# Patient Record
Sex: Female | Born: 1945
Health system: Southern US, Community
[De-identification: ages and names within clinical notes are randomized; demographics above are authoritative.]

## PROBLEM LIST (undated history)

## (undated) DIAGNOSIS — R609 Edema, unspecified: Secondary | ICD-10-CM

## (undated) DIAGNOSIS — D72829 Elevated white blood cell count, unspecified: Secondary | ICD-10-CM

## (undated) DIAGNOSIS — R600 Localized edema: Secondary | ICD-10-CM

## (undated) DIAGNOSIS — F419 Anxiety disorder, unspecified: Secondary | ICD-10-CM

## (undated) DIAGNOSIS — G5600 Carpal tunnel syndrome, unspecified upper limb: Secondary | ICD-10-CM

## (undated) DIAGNOSIS — K921 Melena: Secondary | ICD-10-CM

## (undated) DIAGNOSIS — I129 Hypertensive chronic kidney disease with stage 1 through stage 4 chronic kidney disease, or unspecified chronic kidney disease: Secondary | ICD-10-CM

## (undated) DIAGNOSIS — I10 Essential (primary) hypertension: Secondary | ICD-10-CM

## (undated) DIAGNOSIS — I5032 Chronic diastolic (congestive) heart failure: Secondary | ICD-10-CM

## (undated) DIAGNOSIS — R569 Unspecified convulsions: Secondary | ICD-10-CM

## (undated) DIAGNOSIS — N3020 Other chronic cystitis without hematuria: Secondary | ICD-10-CM

## (undated) DIAGNOSIS — Z860100 Personal history of colon polyps, unspecified: Secondary | ICD-10-CM

## (undated) DIAGNOSIS — I319 Disease of pericardium, unspecified: Secondary | ICD-10-CM

## (undated) DIAGNOSIS — M8580 Other specified disorders of bone density and structure, unspecified site: Secondary | ICD-10-CM

## (undated) DIAGNOSIS — E46 Unspecified protein-calorie malnutrition: Secondary | ICD-10-CM

## (undated) DIAGNOSIS — M545 Low back pain, unspecified: Secondary | ICD-10-CM

## (undated) DIAGNOSIS — R202 Paresthesia of skin: Secondary | ICD-10-CM

## (undated) DIAGNOSIS — G4700 Insomnia, unspecified: Secondary | ICD-10-CM

## (undated) DIAGNOSIS — G44209 Tension-type headache, unspecified, not intractable: Secondary | ICD-10-CM

## (undated) DIAGNOSIS — N183 Chronic kidney disease, stage 3 unspecified: Secondary | ICD-10-CM

## (undated) HISTORY — DX: Elevated white blood cell count, unspecified: D72.829

## (undated) HISTORY — DX: Melena: K92.1

## (undated) HISTORY — DX: Edema, unspecified: R60.9

## (undated) HISTORY — DX: Localized edema: R60.0

## (undated) HISTORY — DX: Tension-type headache, unspecified, not intractable: G44.209

## (undated) HISTORY — DX: Unspecified protein-calorie malnutrition: E46

## (undated) HISTORY — DX: Insomnia, unspecified: G47.00

## (undated) HISTORY — DX: Carpal tunnel syndrome, unspecified upper limb: G56.00

## (undated) HISTORY — DX: Hypertensive chronic kidney disease with stage 1 through stage 4 chronic kidney disease, or unspecified chronic kidney disease: I12.9

## (undated) HISTORY — DX: Essential (primary) hypertension: I10

## (undated) HISTORY — DX: Low back pain, unspecified: M54.50

## (undated) HISTORY — DX: Other chronic cystitis without hematuria: N30.20

## (undated) HISTORY — DX: Other specified disorders of bone density and structure, unspecified site: M85.80

## (undated) HISTORY — DX: Chronic kidney disease, stage 3 unspecified: N18.30

## (undated) HISTORY — DX: Personal history of colon polyps, unspecified: Z86.0100

## (undated) HISTORY — PX: VESICOVAGINAL FISTULA CLOSURE W/ TAH: SUR271

---

## 1998-01-23 ENCOUNTER — Other Ambulatory Visit: Admission: RE | Admit: 1998-01-23 | Discharge: 1998-01-23 | Payer: Self-pay | Admitting: Orthopaedic Surgery

## 1998-02-07 ENCOUNTER — Ambulatory Visit (HOSPITAL_BASED_OUTPATIENT_CLINIC_OR_DEPARTMENT_OTHER): Admission: RE | Admit: 1998-02-07 | Discharge: 1998-02-07 | Payer: Self-pay | Admitting: Orthopaedic Surgery

## 1998-03-03 ENCOUNTER — Ambulatory Visit (HOSPITAL_COMMUNITY): Admission: RE | Admit: 1998-03-03 | Discharge: 1998-03-03 | Payer: Self-pay | Admitting: Cardiology

## 1998-04-19 ENCOUNTER — Other Ambulatory Visit: Admission: RE | Admit: 1998-04-19 | Discharge: 1998-04-19 | Payer: Self-pay | Admitting: *Deleted

## 1998-04-28 ENCOUNTER — Emergency Department (HOSPITAL_COMMUNITY): Admission: EM | Admit: 1998-04-28 | Discharge: 1998-04-28 | Payer: Self-pay | Admitting: Emergency Medicine

## 1998-06-09 ENCOUNTER — Ambulatory Visit (HOSPITAL_COMMUNITY): Admission: RE | Admit: 1998-06-09 | Discharge: 1998-06-09 | Payer: Self-pay | Admitting: Cardiology

## 1998-09-19 ENCOUNTER — Ambulatory Visit (HOSPITAL_BASED_OUTPATIENT_CLINIC_OR_DEPARTMENT_OTHER): Admission: RE | Admit: 1998-09-19 | Discharge: 1998-09-19 | Payer: Self-pay | Admitting: Orthopaedic Surgery

## 1998-09-19 ENCOUNTER — Encounter: Payer: Self-pay | Admitting: Orthopaedic Surgery

## 1999-05-02 ENCOUNTER — Other Ambulatory Visit: Admission: RE | Admit: 1999-05-02 | Discharge: 1999-05-02 | Payer: Self-pay | Admitting: *Deleted

## 2000-05-01 ENCOUNTER — Other Ambulatory Visit: Admission: RE | Admit: 2000-05-01 | Discharge: 2000-05-01 | Payer: Self-pay | Admitting: *Deleted

## 2000-07-24 ENCOUNTER — Inpatient Hospital Stay (HOSPITAL_COMMUNITY): Admission: EM | Admit: 2000-07-24 | Discharge: 2000-08-03 | Payer: Self-pay | Admitting: *Deleted

## 2002-01-10 ENCOUNTER — Emergency Department (HOSPITAL_COMMUNITY): Admission: EM | Admit: 2002-01-10 | Discharge: 2002-01-10 | Payer: Self-pay | Admitting: Emergency Medicine

## 2002-01-10 ENCOUNTER — Encounter: Payer: Self-pay | Admitting: Emergency Medicine

## 2002-03-16 ENCOUNTER — Inpatient Hospital Stay (HOSPITAL_COMMUNITY): Admission: EM | Admit: 2002-03-16 | Discharge: 2002-03-18 | Payer: Self-pay | Admitting: Psychiatry

## 2002-06-18 ENCOUNTER — Encounter: Payer: Self-pay | Admitting: Orthopaedic Surgery

## 2002-06-22 ENCOUNTER — Inpatient Hospital Stay (HOSPITAL_COMMUNITY): Admission: RE | Admit: 2002-06-22 | Discharge: 2002-06-27 | Payer: Self-pay | Admitting: Orthopaedic Surgery

## 2002-06-29 ENCOUNTER — Emergency Department (HOSPITAL_COMMUNITY): Admission: EM | Admit: 2002-06-29 | Discharge: 2002-06-29 | Payer: Self-pay | Admitting: Emergency Medicine

## 2004-10-02 ENCOUNTER — Encounter: Admission: RE | Admit: 2004-10-02 | Discharge: 2004-10-02 | Payer: Self-pay | Admitting: Orthopaedic Surgery

## 2004-10-13 ENCOUNTER — Encounter: Admission: RE | Admit: 2004-10-13 | Discharge: 2004-10-13 | Payer: Self-pay | Admitting: Orthopaedic Surgery

## 2004-10-26 ENCOUNTER — Encounter: Admission: RE | Admit: 2004-10-26 | Discharge: 2004-10-26 | Payer: Self-pay | Admitting: Orthopaedic Surgery

## 2004-11-13 ENCOUNTER — Encounter: Admission: RE | Admit: 2004-11-13 | Discharge: 2004-11-13 | Payer: Self-pay | Admitting: Orthopaedic Surgery

## 2004-11-27 ENCOUNTER — Encounter: Admission: RE | Admit: 2004-11-27 | Discharge: 2004-11-27 | Payer: Self-pay | Admitting: Orthopaedic Surgery

## 2005-01-17 ENCOUNTER — Observation Stay (HOSPITAL_COMMUNITY): Admission: EM | Admit: 2005-01-17 | Discharge: 2005-01-18 | Payer: Self-pay | Admitting: *Deleted

## 2005-06-10 ENCOUNTER — Other Ambulatory Visit: Admission: RE | Admit: 2005-06-10 | Discharge: 2005-06-10 | Payer: Self-pay | Admitting: Nephrology

## 2005-10-18 ENCOUNTER — Encounter: Admission: RE | Admit: 2005-10-18 | Discharge: 2005-10-18 | Payer: Self-pay | Admitting: Nephrology

## 2005-10-21 ENCOUNTER — Encounter: Admission: RE | Admit: 2005-10-21 | Discharge: 2005-10-21 | Payer: Self-pay | Admitting: Nephrology

## 2006-07-10 ENCOUNTER — Other Ambulatory Visit: Admission: RE | Admit: 2006-07-10 | Discharge: 2006-07-10 | Payer: Self-pay | Admitting: Nephrology

## 2007-03-06 ENCOUNTER — Emergency Department (HOSPITAL_COMMUNITY): Admission: EM | Admit: 2007-03-06 | Discharge: 2007-03-06 | Payer: Self-pay | Admitting: Emergency Medicine

## 2007-09-10 ENCOUNTER — Emergency Department (HOSPITAL_COMMUNITY): Admission: EM | Admit: 2007-09-10 | Discharge: 2007-09-10 | Payer: Self-pay | Admitting: Emergency Medicine

## 2007-09-21 ENCOUNTER — Encounter: Admission: RE | Admit: 2007-09-21 | Discharge: 2007-09-21 | Payer: Self-pay | Admitting: Orthopaedic Surgery

## 2007-10-13 ENCOUNTER — Inpatient Hospital Stay (HOSPITAL_COMMUNITY): Admission: RE | Admit: 2007-10-13 | Discharge: 2007-10-17 | Payer: Self-pay | Admitting: Orthopaedic Surgery

## 2007-10-15 ENCOUNTER — Encounter (INDEPENDENT_AMBULATORY_CARE_PROVIDER_SITE_OTHER): Payer: Self-pay | Admitting: Orthopaedic Surgery

## 2007-10-15 ENCOUNTER — Ambulatory Visit: Payer: Self-pay | Admitting: Vascular Surgery

## 2008-07-21 ENCOUNTER — Inpatient Hospital Stay (HOSPITAL_COMMUNITY): Admission: EM | Admit: 2008-07-21 | Discharge: 2008-07-25 | Payer: Self-pay | Admitting: Emergency Medicine

## 2008-08-09 ENCOUNTER — Encounter: Admission: RE | Admit: 2008-08-09 | Discharge: 2008-08-09 | Payer: Self-pay | Admitting: Nephrology

## 2008-09-28 ENCOUNTER — Ambulatory Visit: Payer: Self-pay | Admitting: Internal Medicine

## 2008-09-28 DIAGNOSIS — J45909 Unspecified asthma, uncomplicated: Secondary | ICD-10-CM | POA: Insufficient documentation

## 2008-09-28 DIAGNOSIS — I10 Essential (primary) hypertension: Secondary | ICD-10-CM | POA: Insufficient documentation

## 2008-09-28 DIAGNOSIS — J189 Pneumonia, unspecified organism: Secondary | ICD-10-CM

## 2008-09-28 DIAGNOSIS — R05 Cough: Secondary | ICD-10-CM

## 2008-10-12 ENCOUNTER — Ambulatory Visit: Payer: Self-pay | Admitting: Internal Medicine

## 2008-10-12 DIAGNOSIS — R0609 Other forms of dyspnea: Secondary | ICD-10-CM

## 2008-10-12 DIAGNOSIS — R0989 Other specified symptoms and signs involving the circulatory and respiratory systems: Secondary | ICD-10-CM

## 2008-10-12 LAB — CONVERTED CEMR LAB
Chloride: 97 meq/L (ref 96–112)
Creatinine, Ser: 1.4 mg/dL — ABNORMAL HIGH (ref 0.4–1.2)
GFR calc non Af Amer: 40 mL/min
HCT: 45.5 % (ref 36.0–46.0)
Hemoglobin: 15.7 g/dL — ABNORMAL HIGH (ref 12.0–15.0)
Lymphocytes Relative: 42.9 % (ref 12.0–46.0)
MCHC: 34.4 g/dL (ref 30.0–36.0)
MCV: 92.1 fL (ref 78.0–100.0)
Monocytes Absolute: 0.6 10*3/uL (ref 0.1–1.0)
Neutro Abs: 4.7 10*3/uL (ref 1.4–7.7)
Potassium: 3.2 meq/L — ABNORMAL LOW (ref 3.5–5.1)
Pro B Natriuretic peptide (BNP): 24 pg/mL (ref 0.0–100.0)
WBC: 10.1 10*3/uL (ref 4.5–10.5)

## 2008-10-27 ENCOUNTER — Ambulatory Visit: Payer: Self-pay | Admitting: Internal Medicine

## 2008-11-02 ENCOUNTER — Telehealth (INDEPENDENT_AMBULATORY_CARE_PROVIDER_SITE_OTHER): Payer: Self-pay | Admitting: *Deleted

## 2008-12-02 ENCOUNTER — Ambulatory Visit: Payer: Self-pay | Admitting: Internal Medicine

## 2008-12-05 ENCOUNTER — Telehealth (INDEPENDENT_AMBULATORY_CARE_PROVIDER_SITE_OTHER): Payer: Self-pay | Admitting: *Deleted

## 2009-02-18 ENCOUNTER — Emergency Department (HOSPITAL_COMMUNITY): Admission: EM | Admit: 2009-02-18 | Discharge: 2009-02-18 | Payer: Self-pay | Admitting: Emergency Medicine

## 2009-03-12 HISTORY — PX: COLONOSCOPY: SHX174

## 2009-03-17 ENCOUNTER — Ambulatory Visit: Payer: Self-pay | Admitting: Internal Medicine

## 2009-06-26 ENCOUNTER — Encounter: Payer: Self-pay | Admitting: Internal Medicine

## 2009-07-18 ENCOUNTER — Ambulatory Visit (HOSPITAL_COMMUNITY): Admission: RE | Admit: 2009-07-18 | Discharge: 2009-07-18 | Payer: Self-pay | Admitting: Neurology

## 2009-07-18 ENCOUNTER — Encounter: Payer: Self-pay | Admitting: Cardiology

## 2009-07-18 ENCOUNTER — Ambulatory Visit: Payer: Self-pay | Admitting: Internal Medicine

## 2009-07-18 ENCOUNTER — Encounter: Admission: RE | Admit: 2009-07-18 | Discharge: 2009-07-18 | Payer: Self-pay | Admitting: Neurology

## 2009-07-18 ENCOUNTER — Ambulatory Visit: Payer: Self-pay

## 2009-07-18 ENCOUNTER — Encounter (INDEPENDENT_AMBULATORY_CARE_PROVIDER_SITE_OTHER): Payer: Self-pay | Admitting: Neurology

## 2009-07-19 ENCOUNTER — Ambulatory Visit: Payer: Self-pay | Admitting: Internal Medicine

## 2009-08-12 HISTORY — PX: TOTAL KNEE ARTHROPLASTY: SHX125

## 2009-11-01 ENCOUNTER — Encounter: Admission: RE | Admit: 2009-11-01 | Discharge: 2009-11-01 | Payer: Self-pay | Admitting: Neurology

## 2010-05-14 ENCOUNTER — Encounter: Admission: RE | Admit: 2010-05-14 | Discharge: 2010-05-14 | Payer: Self-pay

## 2010-07-24 ENCOUNTER — Encounter: Payer: Self-pay | Admitting: Internal Medicine

## 2010-07-25 ENCOUNTER — Telehealth (INDEPENDENT_AMBULATORY_CARE_PROVIDER_SITE_OTHER): Payer: Self-pay | Admitting: *Deleted

## 2010-07-26 ENCOUNTER — Ambulatory Visit: Payer: Self-pay | Admitting: Internal Medicine

## 2010-07-27 ENCOUNTER — Encounter: Payer: Self-pay | Admitting: Internal Medicine

## 2010-08-08 ENCOUNTER — Encounter: Payer: Self-pay | Admitting: Internal Medicine

## 2010-08-12 ENCOUNTER — Encounter: Payer: Self-pay | Admitting: Internal Medicine

## 2010-08-21 ENCOUNTER — Telehealth: Payer: Self-pay | Admitting: Internal Medicine

## 2010-09-01 ENCOUNTER — Encounter: Payer: Self-pay | Admitting: Obstetrics and Gynecology

## 2010-09-11 NOTE — Procedures (Signed)
Summary: SUMMARY REPORT  SUMMARY REPORT   Imported By: Mirna Mires 07/19/2009 15:37:23  _____________________________________________________________________  External Attachment:    Type:   Image     Comment:   External Document

## 2010-09-11 NOTE — Miscellaneous (Signed)
Summary: Orders Update pft charges  Clinical Lists Changes  Orders: Added new Service order of Carbon Monoxide diffusing w/capacity (94720) - Signed Added new Service order of Lung Volumes (94240) - Signed Added new Service order of Spirometry (Pre & Post) (94060) - Signed 

## 2010-09-11 NOTE — Progress Notes (Signed)
Summary: rx questions  Phone Note Call from Patient Call back at Home Phone [phone]   Caller: Patient Call For: wert Reason for Call: Talk to Nurse Summary of Call: Omeprazole 20 mg rx was written for her at last visit(04/23).  Her insurance is now paying for the Aciphex and have sent it to her.  Should she take this instead?  Please advise pt. which she needs to take. Initial call taken by: Eugene Gavia,  December 05, 2008 10:41 AM  Follow-up for Phone Call        yes, take the aciphex (basically the same rx) Follow-up by: Nyoka Cowden MD,  December 05, 2008 4:31 PM  Additional Follow-up for Phone Call Additional follow up Details #1::        Called spoke with pt advised OK to substitute Aciphex for Omeprazole per MW. Additional Follow-up by: Cloyde Reams RN,  December 05, 2008 4:47 PM

## 2010-09-11 NOTE — Assessment & Plan Note (Signed)
Summary: Pulmonary /  fu sob  change to B/B   Copy to:  Self Primary Provider/Referring Provider:  Dr. Lurena Joiner  CC:  fu visit....SOB with exertion.........  History of Present Illness: 66 yobf never smoker with problem intermittent itching sneezing difficulty breathing intermittent since age 65's especially with grass  much worse  since 2008 comes and goes daily basis at baseline at rest day and night but also reproducibly with slow adls, better at rest.  September 28, 2008 new consult  persistent resting sob since hosp cone 12/9 -12/09 no better, then prednisone as outpt no better, no better with with allegra.  Cough is dry and shrill is present day = night and causes gagging but no vomiting, not better with inhalers.  Cough takes her breath away but also feels sob even when not coughing.  rec stop actonel, take aciphex, stop acebutolol take bystolic.  October 12, 2008 ov reporting cough completely gone, no longer needs cough meds, suddenly looses breath even sitting still, never while sleeping.  doe x across parking lot. using proaire twice a day and at night wakes up wheezing also better with proaire. added symbicort   October 27, 2008 ov no cough but no change with symbicort in terms of doe except no longer needing poaire. Pt denies any significant sore throat, nasal congestion or excess secretions, fever, chills, sweats, unintended wt loss, pleuritic or exertional cp, orthopnea pnd or leg swelling.  Pt also denies any obvious fluctuation in symptoms with weather or environmental change or other alleviating or aggravating factors.       Current Medications (verified): 1)  Aspirin Adult Low Strength 81 Mg Tbec (Aspirin) .Marland Kitchen.. 1 Once Daily 2)  Diazepam 2 Mg Tabs (Diazepam) .Marland Kitchen.. 1 Three Times A Day 3)  Klor-Con M20 20 Meq Cr-Tabs (Potassium Chloride Crys Cr) .Marland Kitchen.. 1 Once Daily 4)  Bystolic 10 Mg  Tabs (Nebivolol Hcl) .... One Tablet Daily 5)  Demadex 20 Mg Tabs (Torsemide) .Marland Kitchen.. 1 Once Daily 6)   Divigel 1 Mg/gm Gel (Estradiol) .... As Directed 7)  Lunesta 3 Mg Tabs (Eszopiclone) .Marland Kitchen.. 1 At Bedtime As Needed 8)  Proair Hfa 108 (90 Base) Mcg/act Aers (Albuterol Sulfate) .... 2 Puffs Every 4 Hours As Needed 9)  Aciphex 20 Mg  Tbec (Rabeprazole Sodium) .... Take One 30-60 Min Before First and Last Meals of The Day 10)  Symbicort 80-4.5 Mcg/act  Aero (Budesonide-Formoterol Fumarate) .... Two Puffs Twice Daily  Allergies (verified): 1)  ! Codeine  Past History:  Past Medical History:    HYPERTENSION (ICD-401.9)    Unexplained DOE        - CT October 27, 2008 upper lobe emphysema only  Family History:    Reviewed history from 09/28/2008 and no changes required:       no asthma or allergy  Social History:    Reviewed history from 09/28/2008 and no changes required:       Divorced       Children       Retired Airline pilot       Never smoker       No ETOH  Vital Signs:  Patient profile:   65 year old female Height:      61 inches Weight:      173 pounds BMI:     32.81 O2 Sat:      93 % Temp:     97.6 degrees F oral Pulse rate:   99 / minute BP sitting:  130 / 80  (left arm) Cuff size:   regular  O2 Sat at Rest %:  93 O2 Flow:  room air  Physical Exam  Additional Exam:  obese bf amb nad  wt 175 09/28/08 > 170 October 12, 2008 > 173 October 27, 2008  Afeb with normal vital signs HEENT: nl dentition, turbinates, and orophanx. Nl external ear canals without cough reflex Neck without JVD/Nodes/TM Lungs clear to A and P bilaterally without cough on insp or exp maneuvers,  no longer pseudowheeze  RRR no s3 or murmur or increase in P2 Abd soft and benign with nl excursion in the supine position. No bruits or organomegaly Ext warm without calf tenderness, cyanosis clubbing or edema Skin warm and dry without lesions     CT of Chest  Procedure date:  10/27/2008  Findings:      ok except emphysema changes in upper lobes  Pulmonary Function Test Height (in.): 61 Gender:  Female  Impression & Recommendations:  Problem # 1:  ASTHMA (ICD-493.90) I had an extended discussion with the patient today lasting 15 to 20 minutes of a 25 minute visit on the following issues:  I spent extra time with the patient today explaining optimal mdi  technique.  This improved from  0-0%  CT shows evidence of copd but she has never smoked   she must have sign chronic asthma undertreated due to inablity to inhale med effectivey. Try option : change to Brovana and Budesonide, the equivalent of Symbicort, per neb.   Complete Medication List: 1)  Aspirin Adult Low Strength 81 Mg Tbec (Aspirin) .Marland Kitchen.. 1 once daily 2)  Diazepam 2 Mg Tabs (Diazepam) .Marland Kitchen.. 1 three times a day 3)  Klor-con M20 20 Meq Cr-tabs (Potassium chloride crys cr) .Marland Kitchen.. 1 once daily 4)  Bystolic 10 Mg Tabs (Nebivolol hcl) .... One tablet daily 5)  Demadex 20 Mg Tabs (Torsemide) .Marland Kitchen.. 1 once daily 6)  Divigel 1 Mg/gm Gel (Estradiol) .... As directed 7)  Lunesta 3 Mg Tabs (Eszopiclone) .Marland Kitchen.. 1 at bedtime as needed 8)  Proair Hfa 108 (90 Base) Mcg/act Aers (Albuterol sulfate) .... 2 puffs every 4 hours as needed 9)  Pulmicort 0.5 Mg/71ml Susp (Budesonide) .... One twice daily with brovana 10)  Brovana 15 Mcg/60ml Nebu (Arformoterol tartrate) .... One twice daily 11)  Omeprazole 20 Mg Cpdr (Omeprazole) .... By mouth daily. take one half hour before eating.  Other Orders: Misc. Referral (Misc. Ref) Est. Patient Level IV ([address])  Patient Instructions: 1)  stop symbicort and replace it with  Brovana and Budesonide, the equivalent of Symbicort, per neb.  2)  See Patient Care Coordinator before leaving for ct chest 3)  Please schedule a follow-up appointment in 1 month. Prescriptions: OMEPRAZOLE 20 MG  CPDR (OMEPRAZOLE) By mouth daily. Take one half hour before eating.  #30 x 11   Entered and Authorized by:   Nyoka Cowden MD   Signed by:   Nyoka Cowden MD on 10/27/2008   Method used:   Electronically to        [facility] [address] [phone]* (retail)       [address]       [place], [place]  [address]       Ph: [phone]       Fax: [phone]   RxID:   [id number] BROVANA 15 MCG/2ML NEBU (ARFORMOTEROL TARTRATE) one twice daily  #60 x 11   Entered and Authorized by:   Casimiro Needle  Denice Paradise MD   Signed by:   Nyoka Cowden MD on 10/27/2008   Method used:   Electronically to        [facility] [address] [phone]* (retail)       [address]       [place], [place]  [address]       Ph: [phone]       Fax: [phone]   RxID:   [id number] PULMICORT 0.5 MG/2ML SUSP (BUDESONIDE) one twice daily with brovana  #60 x 11   Entered and Authorized by:   Nyoka Cowden MD   Signed by:   Nyoka Cowden MD on 10/27/2008   Method used:   Electronically to        [facility] [address] [phone]* (retail)       [address]       [place], [place]  [address]       Ph: [phone]       Fax: [phone]   RxID:   [id number] OMEPRAZOLE 20 MG  CPDR (OMEPRAZOLE) By mouth daily. Take one half hour before eating.  #30 x 11   Entered and Authorized by:   Nyoka Cowden MD   Signed by:   Nyoka Cowden MD on 10/27/2008   Method used:   Faxed to ...       [facility] (mail-order)       [address]       [place], [place]  [address]       Ph: [phone]       Fax: [phone]   RxID:   [id number] BROVANA 15 MCG/2ML NEBU (ARFORMOTEROL TARTRATE)   #60 x 11   Entered and Authorized by:   Nyoka Cowden MD   Signed by:   Nyoka Cowden MD on 10/27/2008   Method used:   Faxed to ...       [facility] (mail-order)       [address]       [place], [place]  [address]       Ph: [phone]       Fax: [phone]   RxID:   [id number] PULMICORT 0.5 MG/2ML SUSP (BUDESONIDE) one twice daily with brovana  #60 x 11   Entered and Authorized by:   Nyoka Cowden MD   Signed by:   Nyoka Cowden MD on 10/27/2008   Method used:   Faxed to ...        [facility] (mail-order)       [address]       [place], [place]  [address]       Ph: [phone]       Fax: [phone]   RxID:   [id number]

## 2010-09-11 NOTE — Progress Notes (Signed)
Summary: rx clarification-directions/ rx request  Phone Note Call from Patient   Caller: Patient Call For: wert Summary of Call: pt needs clarification on directions for taking budesonide. also needs rx called in for bystolic (says this is for 10mg ). pt has used up the samples of this. [facility] [address]. please call pt at home # asap as she leaves for work at 10:30. or cell afterwards [phone] Initial call taken by: Tivis Ringer,  November 02, 2008 9:37 AM  Follow-up for Phone Call        [facility].   RX for Bystolic sent to pharmacy.  Pt needs to use the Budesonide along with the Brovana in nebulizer twice daily. Michel Bickers CMA  November 02, 2008 11:21 AM  Additional Follow-up for Phone Call Additional follow up Details #1::        Spoke with pt and made aware that nebs should be combined and taken two times a day.  She states that she has been using this correctly, just wanted to make sure.  Also, advised pt that rx for bystolic was sent to pharm. Additional Follow-up by: Vernie Murders,  November 02, 2008 2:52 PM    New/Updated Medications: BYSTOLIC 10 MG  TABS (NEBIVOLOL HCL) One tablet daily   Prescriptions: BYSTOLIC 10 MG  TABS (NEBIVOLOL HCL) One tablet daily  #30 x 6   Entered by:   Michel Bickers CMA   Authorized by:   Nyoka Cowden MD   Signed by:   Michel Bickers CMA on 11/02/2008   Method used:   Electronically to        [facility] [address] [phone]* (retail)       [address]       [place], [place]  [address]       Ph: [phone]       Fax: [phone]   RxID:   [id number]

## 2010-09-13 NOTE — Medication Information (Signed)
Summary: Brovana/ [facility]  Brovana/ [facility]   Imported By: Sherian Rein 08/31/2010 07:50:09  _____________________________________________________________________  External Attachment:    Type:   Image     Comment:   External Document

## 2010-09-13 NOTE — Assessment & Plan Note (Signed)
Summary: Pulmonary/ ext ov with hfa 75% p coaching    Copy to:  Self Primary Provider/Referring Provider:  Dr. Lurena Joiner  CC:  Dyspnea- the same.  History of Present Illness: 49  yobf never smoker with problem of itching sneezing difficulty breathing intermittent since age 65's especially with grass  much worse  since 2008 comes and goes daily basis at baseline at rest day and night but also reproducible  with slow adls, better at rest.  September 28, 2008 new consult  persistent resting sob since hosp cone 12/9 -12/09 no better, then prednisone as outpt no better, no better with with allegra.  Cough  dry and shrill  present day = night and causes gagging but no vomiting, not better with inhalers.  Cough takes her breath away but also feels sob even when not coughing.  rec stop actonel, take aciphex, stop acebutolol take bystolic.  October 12, 2008 ov reporting cough completely gone, no longer needs cough meds, suddenly looses breath even sitting still, never while sleeping.  doe x across parking lot. using proaire twice a day and at night wakes up wheezing also better with proaire. added symbicort   October 27, 2008 ov no cough but no change with symbicort in terms of doe except no longer needing poaire. not able to use hfa so rec  stop symbicort and replace it with  Brovana and Budesonide, the equivalent of Symbicort, per neb and same day  ct chest > limited emphysematous changes, thickened esophagus.  July 19, 2009 ov states can walk 4 miles without stopping,  quite happy with rx. rec no change  July 26, 2010 ov cc doe only with heavy exertion, no cough or noct co's or need for rescued. Pt denies any significant sore throat, dysphagia, itching, sneezing,  nasal congestion or excess secretions,  fever, chills, sweats, unintended wt loss, pleuritic or exertional cp, hempoptysis, change in activity tolerance  orthopnea pnd or leg swelling   Current Medications (verified): 1)  Diazepam 5 Mg  Tabs (Diazepam) .... 1/2 To 1 Three Times A Day As Needed 2)  Klor-Con M20 20 Meq Cr-Tabs (Potassium Chloride Crys Cr) .Marland Kitchen.. 1 Once Daily 3)  Bystolic 10 Mg  Tabs (Nebivolol Hcl) .... One Tablet Daily 4)  Demadex 20 Mg Tabs (Torsemide) .Marland Kitchen.. 1 Once Daily 5)  Divigel 1 Mg/gm Gel (Estradiol) .... As Directed 6)  Lunesta 3 Mg Tabs (Eszopiclone) .Marland Kitchen.. 1 At Bedtime As Needed 7)  Proair Hfa 108 (90 Base) Mcg/act Aers (Albuterol Sulfate) .... 2 Puffs Every 4 Hours As Needed 8)  Pulmicort 0.5 Mg/39ml Susp (Budesonide) .... One Twice Daily With Brovana 9)  Brovana 15 Mcg/69ml Nebu (Arformoterol Tartrate) .... One Twice Daily 10)  Protonix 40 Mg Tbec (Pantoprazole Sodium) .Marland Kitchen.. 1 30 Min Before Breakfast 11)  Citalopram Hydrobromide 40 Mg Tabs (Citalopram Hydrobromide) .Marland Kitchen.. 1 Once Daily 12)  Cyclobenzaprine Hcl 10 Mg Tabs (Cyclobenzaprine Hcl) .Marland Kitchen.. 1 Two Times A Day As Needed  Allergies (verified): 1)  ! Codeine  Past History:  Past Medical History: HYPERTENSION (ICD-401.9) Unexplained DOE c/w mild chronic asthma plus ?  element of gerd/lpr     - CT October 27, 2008 upper lobe emphysema and es thickening     - PFT's March 17, 2009 FEV1 1.55 (71%) ratio 65 no resp to B2, DLC0 44 corrects to 81%  Asthma     - hfa  July 26, 2010  75% p coaching  Past Surgical History: Hysterectomy 1978 R  Knee TKR jan2011  Vital Signs:  Patient profile:   65 year old female Weight:      174.25 pounds BMI:     30.02 O2 Sat:      93 % on Room air Temp:     98.0 degrees F oral Pulse rate:   105 / minute BP sitting:   130 / 64  (left arm)  Vitals Entered By: Vernie Murders (July 26, 2010 10:55 AM)  O2 Flow:  Room air  Physical Exam  Additional Exam:  obese bf amb nad   wt  173 December 02, 2008 > 172 March 17, 2009 > 164 July 19, 2009  > 174 July 26, 2010   HEENT: nl dentition, turbinates, and orophanx. Nl external ear canals without cough reflex Neck without JVD/Nodes/TM Lungs clear to A and P  bilaterally without cough on insp or exp maneuvers,  no longer pseudowheeze  RRR no s3 or murmur or increase in P2 Abd soft and benign with nl excursion in the supine position. No bruits or organomegaly Ext warm without calf tenderness, cyanosis clubbing or edema Skin warm and dry without lesions     Impression & Recommendations:  Problem # 1:  ASTHMA (ICD-493.90)  All goals of asthma met including optimal function and elimination of symptoms with minimum need for rescue therapy. Contingencies discussed today including the rule of two's.     Needs to be able to use hfa for rescuse so I spent extra time with the patient today explaining optimal mdi  technique.  This improved from  25-75% p extensive coaching.  Medications Added to Medication List This Visit: 1)  Diazepam 5 Mg Tabs (Diazepam) .... 1/2 to 1 three times a day as needed 2)  Protonix 40 Mg Tbec (Pantoprazole sodium) .Marland Kitchen.. 1 30 min before breakfast 3)  Citalopram Hydrobromide 40 Mg Tabs (Citalopram hydrobromide) .Marland Kitchen.. 1 once daily 4)  Cyclobenzaprine Hcl 10 Mg Tabs (Cyclobenzaprine hcl) .Marland Kitchen.. 1 two times a day as needed  Other Orders: Est. Patient Level III ([address]40) HFA Instruction [phone])  Patient Instructions: 1)  Work on inhaler technique:  relax and blow all the way out then take a nice smooth deep breath back in, triggering the inhaler at same time you start breathing in and hold a few seconds 2)  If your breathing worsens or you need to use your rescue inhaler more than twice weekly or wake up more than twice a month with any respiratory symptoms or require more than two rescue inhalers per year, we need to see you right away. 3)

## 2010-09-13 NOTE — Medication Information (Signed)
Summary: Prior [employer] for Brovana/[facility]  Prior Autho for [facility]   Imported By: Sherian Rein 08/10/2010 11:21:04  _____________________________________________________________________  External Attachment:    Type:   Image     Comment:   External Document

## 2010-09-13 NOTE — Progress Notes (Signed)
Summary: brovana approval 08-12-10 to 08-12-11  Phone Note Other Incoming   Summary of Call: Received a prior auth confirmation for Brovana from [facility].  Not sure which pharmacy pt has been getting this from.  LMOM for pt to call back to get pharmacy info so we can confirm prior auth approval. Initial call taken by: Abigail Miyamoto RN,  August 21, 2010 11:48 AM  Follow-up for Phone Call        Patient phoned stated that she was returning a call to she thought triage. she stated that she recevied a letter stating that her Rennie Plowman has been approved and she wants to know if we need a copy of the letter. She can be reached at [phone] or [phone] you can leave a message on her voice mail. She uses [employer] [facility].Vedia Coffer  August 22, 2010 1:22 PM  Additional Follow-up for Phone Call Additional follow up Details #1::        called [facility], they do have the approval but no dates.  ATC pt at 1st number, LMOM TCB.  called 2nd number and spoke with patient.  she will fax a letter to our main fax number so that we may have a copy for her chart w/ approval dates.  will hold message to PA box. Boone Master CNA/MA  August 22, 2010 5:26 PM     Additional Follow-up for Phone Call Additional follow up Details #2::    letter received and approval dates are 08-12-10 to 08-12-11. [place] was notified. Form placed in scan folder. Carron Curie CMA  August 29, 2010 9:11 AM

## 2010-09-13 NOTE — Medication Information (Signed)
Summary: Rosalyn Gess Denied/[facility]  Rosalyn Gess Denied/[facility]   Imported By: Sherian Rein 08/21/2010 10:47:11  _____________________________________________________________________  External Attachment:    Type:   Image     Comment:   External Document

## 2010-09-13 NOTE — Medication Information (Signed)
Summary: Rosalyn Gess Denied/[facility]  RX Folder   Imported By: Valinda Hoar 08/08/2010 15:20:58  _____________________________________________________________________  External Attachment:    Type:   Image     Comment:   External Document

## 2010-09-13 NOTE — Progress Notes (Signed)
Summary: needs ov to have form filled out  Phone Note Outgoing Call   Call placed by: Vernie Murders,  July 25, 2010 4:54 PM Call placed to: Patient Summary of Call: Pt dropped off a form regarding her meds.  Per MW- since she has not been seen in over 1 yr, needs ov.  I called the pt, but had to leave a msg tcb Initial call taken by: Vernie Murders,  July 25, 2010 4:55 PM  Follow-up for Phone Call        pt returned call. she made a f/u appt for today w/ dr wert. Tivis Ringer, CNA  July 26, 2010 9:16 AM

## 2010-09-13 NOTE — Medication Information (Signed)
Summary: Approved for Rosalyn Gess / [employer]  Approved for Rosalyn Gess / [employer]   Imported By: Lennie Odor 09/06/2010 16:07:40  _____________________________________________________________________  External Attachment:    Type:   Image     Comment:   External Document

## 2010-11-18 LAB — COMPREHENSIVE METABOLIC PANEL
BUN: 8 mg/dL (ref 6–23)
Calcium: 8.9 mg/dL (ref 8.4–10.5)
Glucose, Bld: 116 mg/dL — ABNORMAL HIGH (ref 70–99)
Total Protein: 6.4 g/dL (ref 6.0–8.3)

## 2010-11-18 LAB — URINALYSIS, ROUTINE W REFLEX MICROSCOPIC
Glucose, UA: NEGATIVE mg/dL
Nitrite: NEGATIVE
Protein, ur: NEGATIVE mg/dL
pH: 7 (ref 5.0–8.0)

## 2010-11-18 LAB — DIFFERENTIAL
Basophils Relative: 1 % (ref 0–1)
Lymphs Abs: 2.9 10*3/uL (ref 0.7–4.0)
Monocytes Relative: 4 % (ref 3–12)
Neutro Abs: 3.7 10*3/uL (ref 1.7–7.7)
Neutrophils Relative %: 51 % (ref 43–77)

## 2010-11-18 LAB — HEMOCCULT GUIAC POC 1CARD (OFFICE): Fecal Occult Bld: NEGATIVE

## 2010-11-18 LAB — CBC
HCT: 40.8 % (ref 36.0–46.0)
Hemoglobin: 13.9 g/dL (ref 12.0–15.0)
MCHC: 34 g/dL (ref 30.0–36.0)
MCV: 91.8 fL (ref 78.0–100.0)
RDW: 13.8 % (ref 11.5–15.5)

## 2010-12-25 NOTE — Op Note (Signed)
NAMEMIKELL, FERRANDINO               ACCOUNT NO.:  [id number]   MEDICAL RECORD NO.:  [id number]          PATIENT TYPE:  INP   LOCATION:  5006                         FACILITY:  [facility]   PHYSICIAN:  Claude Manges. Whitfield, M.D.DATE OF BIRTH:  1946-06-19   DATE OF PROCEDURE:  10/13/2007  DATE OF DISCHARGE:                               OPERATIVE REPORT   PREOPERATIVE DIAGNOSIS:  Loosened right total knee replacement.   POSTOPERATIVE DIAGNOSIS:  Loosened right total knee replacement.   PROCEDURE:  Revision arthroplasty, right total knee replacement.   SURGEON:  Claude Manges. Cleophas Dunker, M.D.   ASSISTANTS:  Lenard Galloway. Chaney Malling, M.D., and Oris Drone. Petrarca, P.A.-C.   ANESTHESIA:  General with supplement femoral nerve block.   COMPLICATIONS:  None.   COMPONENTS:  DePuy MBT size 3 tibial tray with a 75 x 14-mm universal  fluted stem, a 10-mm polyethylene bridging, Sigma RP constrained TC3  insert conforming to the #3 tibia.   Femoral component was a #3 tibial tray with 4-mm augments anteriorly and  posteriorly and a 75 x 14-mm universal fluted stem.  Both was cemented  with polymethyl methacrylate.  The polyethylene tibial button appeared  to be intact and without loosening and therefore was left in place.   PROCEDURE:  The patient was met in the preoperative holding area.  The  appropriate extremity was identified and marked and any questions were  answered.  The patient was then taken to the operating room, where she  was placed under general orotracheal anesthesia without complication.  She did receive a preoperative femoral nerve block.   A Foley catheter was inserted by the nursing staff.  A tourniquet was  applied to the right lower extremity and the leg was then prepped with  Betadine scrub and then DuraPrep from the tourniquet to the midfoot.  Sterile draping was performed.   With the extremity still elevated, it was Esmarch-exsanguinated with the  proximal tourniquet at 350  mmHg.   The prior anterior incision was elliptically excised and via sharp  dissection carried down through the subcutaneous tissue.  The first  layer of capsule was incised in the midline.  A medial parapatellar  incision was then made through the deep capsule.  We had some difficulty  everting the patella laterally as there was significant thickened tissue  in the gutters and about the patella.  There was just a very small area  of avulsion of the patellar tendon that was subsequently repaired with a  Mitek anchor.  After everting the patella, the joint was explored.  There was no effusion.  There was no significant synovitis.  After  removing a cicatrix around the tibia, it was obviously loose and  therefore it was removed.  We initially amputated the stem of the  polyethylene bearing and then removed that.  We were able to place  retractors behind the tibia and excised what appeared to be remnants of  the PCL and thickened posterior capsule.  This allowed advancement of  the tibia anteriorly so that we could then carefully remove the tibial  tray by using small thin  osteotomes medial and laterally.  We removed it  in one piece with a large portion of the methacrylate attached.  Further  methacrylate was then removed from the stem with a small osteotome.  There was no violation of the tibial cortex.   We then evaluated the femur.  The bone scan was positive for loosening.  There was no evidence of infection.  We did subsequently send specimens  of tissue beneath the tibial and femoral components with probably  monocytes, no organisms and very few wbc's.  There was no gross evidence  or laboratory evidence of infection.   The femoral component was then removed.  There was a membrane beneath it  surrounding the anterior and posterior femur.  Any remaining  methacrylate was removed from both the femur and the tibia.   We proceeded with revision arthroplasty.  We initially proceeded  with  preparing the tibia.  Reaming was performed to 14 mm and then we used  the external guide to be sure we were in appropriate alignment.  The  cutting jig was applied to the proximal tibia.  We just made a minimal  cut but removing maybe a millimeter of bone but allowing [place] to remove  bone circumferentially.  Further reaming was performed to allow the  stemmed tibial component.  The stemmed tibial component was 75 mm long  with a 14-mm wide flute, was then inserted and flush on the tibia.  I  thought we had excellent alignment and position of the varus-valgus.   The femoral component was then prepared.  Reaming was performed to 14 mm  through the central femoral hole.  Guides were applied to make cuts both  anteriorly and posteriorly and distally.  We needed 4 mm distal and  posterior augments.  The component was only about a millimeter off the  femur anteriorly.   We decided a #3 and then inserted this with the 14-mm fluted stem, 75 mm  in length.  The construct was then reduced.  We initially tried the  nonconstrained 10-mm polyethylene bearing but felt that we had more  stability, particularly laterally, with a constrained component.  With  that in place we had perfect stability and full extension and flexion  and no malrotation.   The trial components were then removed.  The joint was copiously  irrigated with jet saline solution.  The thickened capsule posteriorly  was further excised.   The final tibial tray was then applied, #3 with the fluted stem.  Methacrylate was inserted and then removed from the periphery of the  component.  The femoral component was then inserted with the augments  and the fluted stem.  Methacrylate was removed from its from its  periphery as well.  The constrained 10-mm polyethylene bearing was then  easily inserted.  The entire construct was then placed in extension with  compression across the components and any further methacrylate was  removed  with a Glorious Peach.  We checked be sure there was no extraneous  methacrylate around any of the components.   After complete maturation, the joint was inspected.  It was copiously  irrigated with saline solution.  We had perfect stability and nice  alignment of the components.  The lab did report that the specimen sent  had monocytes, very few white cells, no organisms.  Clinically there was  no evidence of infection.   The tourniquet was deflated about 2 hours 15 minutes.  Gross bleeders  were Bovie-coagulated.  The capsule  was infiltrated with Marcaine with  epinephrine.  We had a nice, dry field and therefore a Hemovac was not  necessary.  A single Mitek anchor was inserted to secure the patellar  tendon.  Deep capsule was closed with interrupted #1  Ethibond, superficial capsule with a running 0Vicryl, subcu with 2-0  Vicryl, skin closed with skin clips.  A sterile bulky dressing was  applied, followed by a compressive dressing.   The patient tolerated the procedure without complications.      Claude Manges. Cleophas Dunker, M.D.  Electronically Signed     PWW/MEDQ  D:  10/13/2007  T:  10/13/2007  Job:  [id number]

## 2010-12-28 NOTE — Op Note (Signed)
NAME:  Renee Adkins, Renee Adkins                         ACCOUNT NO.:  [id number]   MEDICAL RECORD NO.:  [id number]                   PATIENT TYPE:  INP   LOCATION:  NA                                   FACILITY:  [facility]   PHYSICIAN:  Lubertha Basque. Jerl Santos, M.D.             DATE OF BIRTH:  1946-02-24   DATE OF PROCEDURE:  06/22/2002  DATE OF DISCHARGE:                                 OPERATIVE REPORT   PREOPERATIVE DIAGNOSIS:  Right knee degenerative arthritis.   POSTOPERATIVE DIAGNOSIS:  Right knee degenerative arthritis.   PROCEDURE:  Right total knee replacement.   ANESTHESIA:  General.   SURGEON:  Lubertha Basque. Jerl Santos, M.D.   ASSISTANT:  Lindwood Qua, P.A.   INDICATION FOR PROCEDURE:  The patient is a 66 year old woman with a long  history of right knee pain.  She is known to have end-stage degenerative  change through arthroscopy x2, the latest of which was about three years  ago.  Her difficulty has persisted despite various oral anti-inflammatories  and injections.  She is offered a knee replacement operation.  The procedure  was discussed with the patient, and informed operative consent was obtained  after discussion of possible complications of, reaction to anesthesia,  infection, DVT, PE, and death.   DESCRIPTION OF PROCEDURE:  The patient was taken to the operating suite,  where a general anesthetic was applied without difficulty.  She was  positioned supine and prepped and draped in a normal sterile fashion.  After  the administration of preop IV antibiotics, the right leg was elevated,  exsanguinated, and a tourniquet inflated about the thigh.  An anterior  longitudinal incision was made, which was sectioned down to the extensor  mechanism.  All appropriate anti-infective measures were used, including the  aforementioned IV antibiotic, Betadine-impregnated drape, and closed, hooded  exhaust systems for each member of the surgical team.  A medial parapatellar  incision was  made through the extensor mechanism.  The kneecap was flipped  and the knee flexed.  She had end-stage degeneration in all three  compartments.  Her bone quality was good.  There were some residual meniscal  tissues and an intact ACL and PCL, all of which were removed.  An  extramedullary guide was placed on the tibia, creating a cut parallel to the  floor.  An intramedullary guide was then placed in the femur to create  anterior and posterior cuts, making a flexion gap of 10 mm.  A separate  intramedullary guide was placed in the femur, creating a distal cut to form  an equal extension gap of 10 mm.  The femur sized to a standard plug  component, and the appropriate chamfer cutting guide was placed and  utilized.  The tibia sized to a size 3, and the appropriate guide was placed  and used.  The trial reduction was done with these components with the 10 mm  spacer.  She easily came to full extension and flexed past 100 degrees.  The  kneecap tracked well, and no lateral release was required.  The kneecap was  cut free-hand, removing about 9 mm of bone.  A size 35 appeared to fit best,  and the appropriate guide was placed and used to make three holes in the  patella.  All trial components were removed, followed by pulsatile lavage of  all cut bony surfaces.  Cement was mixed including antibiotic and then was  then pressurized into all three cut bony surfaces.  The DePuy LCS components  were then cemented into place.  We used a size standard plus femur, 3 tibia,  and 35 mm all-polyethylene patellar component.  The 10 mm deep dish spacer  was placed.  Excess cement was trimmed, and pressure was held on the  components until all cement had hardened.  The knee then ranged from full  extension to 120 degrees of flexion.  The tourniquet was deflated, and a  small amount of bleeding was easily controlled with Bovie cautery.  The knee  was thoroughly irrigated, followed by placement of a drain  exiting  superolaterally.  The extensor mechanism was reapproximated with #1 Vicryl  in interrupted fashion.  Once this was finished, the knee again flexed to  120.  Subcutaneous tissues reapproximated with 0 and 2-0 undyed Vicryl,  followed by skin closure with staples.  Adaptic was placed on the wound,  followed by dry gauze and a loose Ace wrap.  Estimated blood loss and  intraoperative fluids can be obtained from anesthesia records, as can  accurate tourniquet time, which was less than one hour.   DISPOSITION:  The patient was extubated in the operating room.  Plans were  for her to receive a femoral nerve block.  She was then to be transferred to  the recovery room.  She will eventually be admitted to the orthopedic  surgery service for appropriate postop care to include perioperative  antibiotics and Coumadin for DVT prophylaxis.                                               Lubertha Basque Jerl Santos, M.D.    PGD/MEDQ  D:  06/22/2002  T:  06/22/2002  Job:  [id number]

## 2010-12-28 NOTE — Discharge Summary (Signed)
[facility]  Patient:    Renee Adkins, Renee Adkins                      MRN: [id number] Adm. Date:  [id number] Disc. Date: [id number] Attending:  Otilio Saber                           Discharge Summary  BRIEF HISTORY:  Ms. Spillane is a 65 year old divorced black female admitted with history of increasing depression and suicidal ideation.  She had a long history of depression and anxiety, dating back to at least 1999.  She had been involved in severe conflicts with her work at [employer], saying that she had been forced out of a job by [employer] of the college.  She had a knee injury at the time also which required subsequent surgery.  She had become increasingly depressed with ongoing conflicts with the university and ongoing peers.  She had become increasingly dystonic with decreased sleep, decreased appetite, decreased interest, increased anxiety and increasing suicidal thoughts.  She could not contract for safety and it was felt that inpatient stabilization was necessary.  PAST PSYCHIATRIC HISTORY:  The patient had been followed through my office for the past two years.  She had been tried on several antidepressants but had not responded well.  She had shown some improvement on a combination of Topamax and Seroquel.  She had no previous psychiatric treatment or history.  ADMISSION MEDICATIONS:  Topamax 150 mg q.h.s., Xanax 0.5 mg five times a day, Seroquel 25 mg q.d. and 400 mg q.h.s. and p.r.n. Restoril.  PAST MEDICAL HISTORY:  She was followed medically by Dr. Ranae Pila.  She had a history of asthma and surgery on her right knee several years ago.  She has been on Premarin, Provera, Sectral and Claritin.  ALLERGIES:  She reported being allergic to CODEINE.  PHYSICAL EXAMINATION:  On admission, showed no acute findings.  MENTAL STATUS EXAMINATION:  On admission, casually-dressed, mildly overweight, middle-aged black female.  Speech was soft but  otherwise normal.  Thought processes are logical and coherent without evidence of psychosis.  She had suicidal ideation.  Mood was depressed.  Affect was sad and blunted.  Oriented x 3.  Cognitive function was intact.  ADMITTING DIAGNOSES: Axis I:    Depression not otherwise specified. Axis II:   No diagnosis. Axis III:  Status post surgery on right knee. Axis IV:   Psychosocial stressors severe. Axis V:    Global Assessment of Functioning:  Current 40; highest past year            59.  LABORATORY FINDINGS:  Admission CBC was normal.  Blood chemistry showed a decreased potassium of 3.1 which later normalized at 3.7.  Thyroid panel was normal.  Urine drug screen was negative.  Urinalysis was normal.  HOSPITAL COURSE:  Patient was admitted to the [facility] for treatment of her depression and to prevent any suicidal acting out.  Patient was continued on her Seroquel and we elected to increase her Topamax.  She remained depressed and angry.  She continued to have suicidal thoughts and crying spells.  We discussed her previous treatment with antidepressants and looked at options including MAO inhibitors.  We elected eventually to try her on Anafranil.  Patient tolerated the medication well and gradually began feeling somewhat less depressed and showed some improvement in her sleep.  Her suicidal thoughts gradually decreased and  she felt somewhat calmer.  She eventually stabilized sufficiently.  It was felt that she could be safely managed on an outpatient basis.  CONDITION ON DISCHARGE:  Patient was discharged in improved condition with improvement in her mood, improvement in her sleep and appetite, alleviation of her suicidal ideation and alleviation of her crying spells.  DISPOSITION:  The patient is discharged home.  FOLLOW-UP:  My office on August 14, 2000.  DISCHARGE MEDICATIONS: 1. Anafranil 50 mg q.h.s. 2. Topamax 200 mg q.h.s. 3. Seroquel 25 mg q.i.d. and 400  mg q.h.s. 4. Restoril 30 mg q.h.s. p.r.n. 5. Xanax 0.5 mg five times a day. 6. Sectral 200 mg b.i.d. 7. Claritin 10 mg q.d.  DISCHARGE INSTRUCTIONS:  She was to be on a low-salt diet.  FINAL DIAGNOSES: Axis I:    Depression not otherwise specified. Axis II:   No diagnosis. Axis III:  Status post surgery on right knee. Axis IV:   Psychosocial stressors severe. Axis V:    Global Assessment of Functioning:  Current 50; highest past year            55. DD:  09/19/00 TD:  09/20/00 Job: [id number]

## 2010-12-28 NOTE — H&P (Signed)
[facility]  Patient:    TEYANNA, THIELMAN                      MRN: [id number] Adm. Date:  [id number] Attending:  Otilio Saber                         History and Physical  PATIENT IDENTIFICATION:  Ms. Mendel Corning is a 65 year old divorced black female admitted with a history of increasing depression and suicidal ideation.  HISTORY OF PRESENT ILLNESS:  Patient has a long history of depression and anxiety since at least 1999.  She had been involved in severe conflicts with her work at [employer], stating that she had been forced out of her job by [employer] of the college.  She also had a knee injury at the time and required subsequent surgery.  She has become increasingly depressed over the years due to ongoing conflicts with the university and ongoing fears.  She has become increasingly despondent with decreased sleep, decreased appetite, decreased interest, increased anxiety, and increasing suicidal thoughts.  She was seen on July 23, 2000 and was feeling depressed with some suicidal thoughts, but was able to contract for safety.  On the day of admission, however, she could not contract for safety and felt suicidal, and we decided that she required inpatient stabilization.  PAST PSYCHIATRIC HISTORY:  The patient has been followed through my office for the past 2 years.  She has been tried on several antidepressants but has not responded well.  She has shown some improvement on Topamax and Seroquel.  She is currently on Topamax 150 mg q.h.s., Xanax 0.5 mg 5 times a day, and Seroquel 25 mg q.d. and 400 mg q.h.s.  She also uses p.r.n. Restoril.  She has had no previous psychiatric treatment or illness.  SUBSTANCE ABUSE HISTORY:  There is no history of drug or alcohol abuse.  PAST MEDICAL HISTORY:  The patient is followed by Dr. Ranae Pila.  She has a history of asthma and had surgery on her right knee several years ago.  She also has had some  apparent fluid retention at times.  She has been on Premarin, Provera, Sectral, and Claritin.  She reports being allergic to CODEINE.  SOCIAL HISTORY:  The patient is divorced and lives alone.  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 She has been out of work for the past several years.  FAMILY HISTORY:  There is no known history of psychiatric or substance abuse illness.  PHYSICAL EXAMINATION and REVIEW OF SYSTEMS are pending.  MENTAL STATUS EXAMINATION:  On admission revealed a casually dressed, mildly overweight middle aged black female.  Speech is soft, but otherwise normal. Thought process are logical and coherent without evidence of psychosis.  She has suicidal ideation.  Mood is depressed.  Affect is sad and blunted. Oriented x 3.  Cognitive function is intact.  ADMISSION DIAGNOSES: Axis I:    Depression not otherwise specified. Axis II:   No diagnosis. Axis III:  Status post surgery on right knee. Axis IV:   Psychosocial stressors severe. Axis V:    Global assessment of function current is 40, highest past            year is 55.  INITIAL PLAN OF CARE:  She will be continued on Seroquel and we will try increasing her Topamax.  We will need to again  look at possible trial on another antidepressant medication. DD:  07/25/00 TD:  07/25/00 Job: [id number]

## 2010-12-28 NOTE — Discharge Summary (Signed)
NAME:  Renee Adkins, Renee Adkins                         ACCOUNT NO.:  [id number]   MEDICAL RECORD NO.:  [id number]                   PATIENT TYPE:  IPS   LOCATION:  0302                                 FACILITY:  [facility]   PHYSICIAN:  Jeanice Lim, MD                DATE OF BIRTH:  02/20/1946   DATE OF ADMISSION:  03/16/2002  DATE OF DISCHARGE:  03/18/2002                                 DISCHARGE SUMMARY   IDENTIFYING DATA:  This is a 65 year old divorced African-American female,  voluntarily admitted, presenting with a history of making a homicidal  statement while having an office visit with a psychiatrist, currently in a  lawsuit with her employer, and the patient is attempting to get back pay,  and the court case continues to be appealed.  The patient is seen by Dr.  Valente David.  No history of violence of suicide attempts.   ADMISSION MEDICATIONS:  On Risperdal for the past 2 weeks.   ALLERGIES:  CODEINE, SEROQUEL.  Got a rash from Seroquel, but reports having  allergies to all the SSRIs.   PHYSICAL EXAMINATION:  Essentially within normal limits, neurologically  nonfocal.   ROUTINE ADMISSION LABS:  Urine drug screen positive for benzodiazapines.  Thyroid panel within normal limits.  CBC with differential essentially  within normal limits.  Hematocrit slightly low at 35.4.  Potassium low at  2.8.   MENTAL STATUS EXAM:  The patient was an alert and oriented African-American  female, cooperative, good eye contact.  Speech was clear, mood depressed,  affect flat, thought process goal directed.  Thought content:  Reporting a  history of auditory hallucinations but none currently.  No other psychotic  symptoms.  Denied acute suicidal or homicidal ideation.  Cognitively intact.  Judgment and insight fair.   ADMISSION DIAGNOSES:   AXIS I:  Major depressive disorder recurrent, severe.   AXIS II:  None.   AXIS III:  History of knee problems.   AXIS IV:  Severe, occupational and  legal stress.   AXIS V:  30/55.   HOSPITAL COURSE:  The patient was admitted and ordered routine p.r.n.  medications, underwent further monitoring.  The patient was evaluated by a  psychiatrist on the day following admission.  The patient reported being  upset and angry about her worker's comp case and made a statement out of  anger, but denied any homicidal or dangerous ideation, reporting no command  hallucinations, a distant history of hearing voices.  Mood was mildly  depressed, affect pleasant, slightly restricted.  There were no psychotic  symptoms.  The patient reported a good aftercare plan and judgment and  insight seemed to be intact.  There was no clear indication for inpatient  treatment or no acute risk issues, therefore the patient was discharged in  improved condition.  Mood was mildly depressed, affect somewhat restricted,  thought processes goal directed, thought content negative for  dangerous  ideation or psychotic symptoms.  The patient showed good problem solving  skills and was future oriented and had no plan to hurt anyone, but was  frustrated with the legal case.  The patient reported motivation to be  compliant with the followup plan and the patient was discharged.   DISCHARGE MEDICATIONS:  1. Xanax 1 mg q.a.m., 0.5 q.12 p.m., and 1 mg q.h.s.  2. Restoril 30 mg q.h.s.  3. The patient was to continue Sectral, Benedek and K-Dur, as well as     albuterol and Claritin as previously prescribed.  4. Risperdal 0.5 my 1-1/2 q.h.s.  5. Topamax 100 mg  b.i.d.   DISPOSITION:  The patient was to follow up with Dr. Valente David, keeping her  regular appointment on August 12.   DISCHARGE DIAGNOSES:   AXIS I:  Major depressive disorder recurrent, severe.   AXIS II:  None.   AXIS III:  History of knee problems.   AXIS IV:  Severe, occupational and legal stress.   AXIS V:  Global assessment of function on discharge was 65.                                                  Jeanice Lim, MD    JEM/MEDQ  D:  04/19/2002  T:  04/19/2002  Job:  [id number]

## 2010-12-28 NOTE — Discharge Summary (Signed)
Renee Adkins, Renee Adkins               ACCOUNT NO.:  [id number]   MEDICAL RECORD NO.:  [id number]          PATIENT TYPE:  INP   LOCATION:  6727                         FACILITY:  [facility]   PHYSICIAN:  Jarome Matin, M.D.DATE OF BIRTH:  May 27, 1946   DATE OF ADMISSION:  07/20/2008  DATE OF DISCHARGE:  07/25/2008                               DISCHARGE SUMMARY   ADMITTING DIAGNOSES:  Bronchitis, coughing, shortness of breath.   DISCHARGE DIAGNOSES:  Pneumonia, hypertension, asthma, joint pains.   BRIEF HISTORY, PHYSICAL AND HOSPITAL COURSE:  This is a 65 year old  African American female who states that she was shopping with her sister  and she developed shortness of breath and dry cough, is feeling vague  and weak.  Her sister brought her to the [facility] Emergency Room.  Further examination in the emergency room showed the patient had a  bilateral patchy infiltrates in both lung bases.  The patient was  cultured and admitted for bilateral pneumonia.  She also has a history  of having asthma attacks.   PHYSICAL EXAMINATION:  VITAL SIGNS:  Temperature 97.6, pulse 95, blood  pressure 145/85, 99% on 2 L.  GENERAL:  She was alert.  HEENT:  Neck veins were flat.  CHEST:  She had bilateral rhonchi and some rales at the bases.  ABDOMEN:  Soft.  No mass.  No organomegaly.  She had good bowel sounds.  EXTREMITIES:  She had pain in both knees, especially in the left knee.   The patient was admitted for pneumonia.   The patient lives alone.  She has sister.   PERTINENT LABS:  Cardiac markers were negative.  Urine was negative.  Cultures were essentially negative.  White count did go up to 13.1 and  glucose went up to 182.  Remainder of her labs were essentially  negative.   Started on Rocephin, Zithromax, and she continued on Actonel, Demadex,  baby aspirin, and diazepam.  We stopped Actonel and she was put on  Lovenox prophylaxis.  The patient was given Tussionex for cough and we  continued to follow her chest x-rays with improvement in infiltrates.  Gradually, the patient started to feel better, coughing improved, and  the chest sounded more clearly and the pneumonia cleared pretty well.  White count came down to 9.1.  She did have eosinophils were about 6,  may have some allergic component owing to, but we developed a negative  chest x-ray.  The patient was discharged on erythromycin 500 mg daily  for 5 more days after discharge.  She is to come to the office in a  month or 2 weeks after discharge.           ______________________________  Jarome Matin, M.D.     CEF/MEDQ  D:  09/03/2008  T:  09/04/2008  Job:  [id number]

## 2010-12-28 NOTE — Discharge Summary (Signed)
NAME:  Adkins, Renee                         ACCOUNT NO.:  [id number]   MEDICAL RECORD NO.:  [id number]                   PATIENT TYPE:  INP   LOCATION:  5041                                 FACILITY:  [facility]   PHYSICIAN:  Lubertha Basque. Jerl Santos, M.D.             DATE OF BIRTH:  15-Jun-1946   DATE OF ADMISSION:  06/22/2002  DATE OF DISCHARGE:  06/27/2002                                 DISCHARGE SUMMARY   ADMISSION DIAGNOSES:  1. Right knee end-stage degenerative joint disease.  2. History of anxiety and depression   DISCHARGE DIAGNOSES:  1. Right knee end-stage degenerative joint disease.  2. History of anxiety and depression   OPERATION:  Right total knee replacement.   BRIEF HISTORY:  The patient is a 65 year old black female patient, well  known to our practice, complaining of right knee pain, trouble sleeping at  nighttime.  She has had knee arthroscopy that showed grade IV degenerative  changes of most surfaces. We have done multiple corticosteroid injections.  We tried Synvisc, which is a viscosupplementation, and she has continued  pain.  Discussed different treatment options with the patient being total  knee replacement.   LABORATORY DATA:  She was on Coumadin therapy, so serial pro times were  done.   Her EKG showed normal sinus rhythm.   Chest x-ray showed bilateral lower lobe atelectasis.   Last tested hemoglobin was 9.9.   HOSPITAL COURSE:  The patient was admitted postoperatively and placed on a  variety of p.o. and IM analgesics for pain, IV Ancef 1 g q.8h. X 3 doses.  Physical therapy was ordered to be weightbearing as tolerated.  Dressing was  changed, drain pulled the second day postop, and patient did well with  physical therapy and was able to be discharged home.   CONDITION ON DISCHARGE:  Improved.   FOLLOW UP:  She will come back to our office at the two-week mark for suture  removal.  Continue on Coumadin for about one month postoperatively to  prevent DVT,and prescription for pain medicine was given with resumption of  usual medications on which she was admitted to the hospital.      Lindwood Qua, P.A.                    Lubertha Basque Jerl Santos, M.D.   MC/MEDQ  D:  07/22/2002  T:  07/22/2002  Job:  [id number]

## 2010-12-28 NOTE — H&P (Signed)
[facility]  Patient:    Renee Adkins, Renee Adkins                      MRN: [id number] Adm. Date:  [id number] Attending:  Otilio Saber                         History and Physical  REVIEW OF SYSTEMS:  Remarkable for history of asthma, currently relieved by her rescue inhaler.  She has problems with rapid heartbeat for which she takes ______ daily, a beta blocker, and she is also on ______  30 mg daily as a diuretic.  ROS is also remarkable for right knee pain which happens intermittently with activities of daily living.  She had an arthroscopy x 2 in 2000 and in 1998.  Other than that, she has no particular complaints.  PHYSICAL EXAMINATION:  GENERAL:  This is a 65 year old black female, meticulously groomed and neatly dressed.  She is relaxed and cooperative.  Well-nourished, well-developed. Appears younger than her stated age.  VITAL SIGNS:  Today are temperature 96.7, pulse is 96, blood pressure sitting 125/79 and standing 119/74.  Those were this mornings vital signs.  On admission her height was 4 feet 11 inches and weight 163 pounds.  SKIN, HAIR, NAILS:  Skin is smooth, without lesions.  There are 1 cm scars, two of them are noted on her right knee.  Hair distribution is normal for age and sex.  Nail beds are pink, and nails are well groomed and in good condition.  HEENT:  Head is normocephalic, with a smooth scalp without lesions.  Eyes: EOMs are intact.  PERRLA.  Conjunctivae clear.  Lids are without edema.  Optic discs and fundi are visualized bilaterally.  There is no evidence of AV nicking.  ENT:  EACs are clear bilaterally.  TMs are clear, and bony landmarks are visualized.  Hearing is intact for whispered voice.  Nasal mucosa is pink and moist.  Septum midline.  Oral mucosa is pink and moist.  Teeth show evidence of extensive dental work but, otherwise, mouth is in good condition. Uvula is midline.  There is no evidence of masses or injection  in the oropharynx.  NECK:  No thyromegaly, no masses.  Trachea is midline.  Nodes are nonpalpable.  CHEST:  Respiratory effort is easy, and respirations are 18.  Clear to auscultation.  The PMI is palpated at the fourth intercostal space left sternal border.  S1 and S2 are present.  No murmurs, clicks, or rubs are heard.  Pedal pulses are 2+, without varicosities or edema.  No abdominal bruits are heard.  Carotid pulses are 2+ without bruit.  Chest is symmetrical.  BREASTS:  Deferred.  ABDOMEN:  Bowel sounds are present in all four quadrants.  Abdomen is rounded. There is no tenderness or mass palpated.  GENITALIA:  Deferred.  MUSCULOSKELETAL:  Spine is straight.  Posture is erect.  Gait is normal. Strength is 5/5 throughout all extremities.  There is no obvious crepitation, erythema, or swelling in any of the joints.  There is no crepitation in the right knee, nor is there tenderness.  NEUROLOGIC:  Cranial nerves II-XII are intact.  Extraocular movements are intact.  Reflexes:  Brachial reflexes are 2+ bilaterally.  Patellar reflexes are 1+ bilaterally.  There is no tremor or involuntary movement.  Romberg is negative. DD:  07/28/00 TD:  07/28/00 Job: [id number]

## 2010-12-28 NOTE — Discharge Summary (Signed)
Renee Adkins, Renee Adkins               ACCOUNT NO.:  [id number]   MEDICAL RECORD NO.:  [id number]          PATIENT TYPE:  INP   LOCATION:  3703                         FACILITY:  [facility]   PHYSICIAN:  Nicki Guadalajara, M.D.     DATE OF BIRTH:  1945-08-26   DATE OF ADMISSION:  01/17/2005  DATE OF DISCHARGE:  01/18/2005                                 DISCHARGE SUMMARY   DISCHARGE DIAGNOSES:  1.  Chest pain, resolved at discharge.  2.  Positive family history of coronary disease.  3.  History of anxiety and depression.  4.  History of normal catheterization approximately four years ago, and      normal Cardiolite approximately two years ago.   HISTORY OF PRESENT ILLNESS AND HOSPITAL COURSE:  This is a 65 year old  African-American female who presented to the emergency room with complaints  of upper epigastric discomfort, shortness of breath and questionable  diaphoresis.  She in the past was seen by Dr. Elsie Lincoln and had a  catheterization approximately four years ago, which showed normal  coronaries.  Approximately two years ago she underwent a Cardiolite stress  test that also did not show any perfusion abnormalities.  She was seen by  Dr. Tresa Endo on arrival in the emergency room and a decision was made to admit  the patient to the hospital for 23-hour observation on rule out MI protocol.   Enzymes showed a negative result.  Her renal function was normal with BUN 8  and creatinine 0.8.  Sodium was 140, potassium 3.8, glucose 96.  Hemoglobin  13.5, hematocrit 40.2.   The following morning, the patient was chest pain-free.  Her blood pressure  was 122/64, heart rate 82.  She was afebrile, and her saturation was 97% on  room air.   A decision was made to discharged the patient home with outpatient  Cardiolite follow-up.   DISCHARGE MEDICATIONS:  1.  __________ 200 mg b.i.d.  2.  Xanax 0.5 mg t.i.d. and Xanax 1 mg q.h.s.  3.  Restoril 60 mg q.h.s.  4.  Premarin 1.25 mg daily.  5.  Demadex 20  mg every other day as needed.  6.  Protonix 40 mg daily.  7.  Claritin 10 mg daily.  8.  Risperdal 1.5 mg daily.   DISCHARGE ACTIVITIES:  As tolerated without exertion.   DISCHARGE DIET:  Low-salt, low-fat, low-cholesterol diet.   DISCHARGE FOLLOW-UP:  Our office will contact the patient to schedule her  for outpatient Cardiolite stress test and then follow-up appointment with  Dr. Elsie Lincoln.   ADDENDUM:  EKG did not show any specific ST-T wave changes, was normal sinus  rhythm at 63 beats per minute rate, and her telemetry consistently showed  sinus rhythm.      Renee Adkins   MK/MEDQ  D:  01/18/2005  T:  01/18/2005  Job:  [id number]

## 2010-12-28 NOTE — H&P (Signed)
NAME:  Renee Adkins, Renee Adkins                         ACCOUNT NO.:  [id number]   MEDICAL RECORD NO.:  [id number]                   PATIENT TYPE:  IPS   LOCATION:  0302                                 FACILITY:  [facility]   PHYSICIAN:  Landry Corporal, NP                   DATE OF BIRTH:  [birth year]   DATE OF ADMISSION:  03/16/2002  DATE OF DISCHARGE:  03/18/2002                         PSYCHIATRIC ADMISSION ASSESSMENT   IDENTIFYING INFORMATION:  This is a 65 year old divorced African-American  female voluntarily admitted March 16, 2002.   HISTORY OF PRESENT ILLNESS:  The patient presents with a history of making a  homicidal statement while having an office visit with a psychiatrist.  The  patient is currently involved in a lawsuit against her employer and her case  keeps getting appealed and patient has attempting to get her back pay as  well but the court case keeps getting appealed.  The patient had told her  M.D. that I'm tired of this.  They all need to be killed or something to  that effect, she stated.  The patient was brought to our service by her  psychiatrist in lieu of that statement.  She reports that she would never  harm anyone.  She has no history of violence.  She has no plan.  She has  been hearing positive auditory hallucinations that want her to have this  case finished.  She denies any visual hallucinations, no paranoia, no  suicidal ideation and sleep and appetite has been fair.   PAST PSYCHIATRIC HISTORY:  Sees Dr. Valente David as an outpatient.  Second  admission to [facility].  Last admission was in December of  2001.  No history of violence.  No history of a suicide attempt.   SOCIAL HISTORY:  This is a 65 year old divorced African-American female,  divorced for 20 years.  Has a little girl and an adult daughter.  She has a  lawsuit pending with her former employer for the past four years.   FAMILY HISTORY:  None known.   ALCOHOL/DRUG HISTORY:  Nonsmoker.   Denies any alcohol or substance abuse.   PRIMARY CARE PHYSICIAN:  Dr. Valentino Hue, in [place].  Sees Dr. Michaelle Birks,  cardiologist , and Dr. Jerl Santos, her orthopedic.   MEDICAL PROBLEMS:  Asthma, sinusitis, hay fever and patient is to have a  right knee replacement on March 24, 2002.   MEDICATIONS:  The patient has been on Risperdal for the past two weeks.   ALLERGIES:  CODEINE and SEROQUEL.  Gets a rash from Seroquel.  Said she has  allergies to ALL SSRIs, has physical complaints.   PHYSICAL EXAMINATION:  VITAL SIGNS:  The patient is 4 feet 11 inches tall.  She is 138 pounds.  Temperature 97.3, pulse 76, respirations 18, blood pressure 100/60.   SKIN:  Warm and dry.   GENERAL:  The patient in no acute distress and  appears as a well-nourished,  African-American female.   LABORATORY DATA:  Her urine drug screen was positive for benzodiazepines.  Her urinalysis is normal.   DICTATION ENDED                                               Landry Corporal, NP    JO/MEDQ  D:  03/18/2002  T:  03/20/2002  Job:  [id number]

## 2010-12-28 NOTE — Discharge Summary (Signed)
Renee Adkins, Renee Adkins               ACCOUNT NO.:  [id number]   MEDICAL RECORD NO.:  [id number]          PATIENT TYPE:  INP   LOCATION:  5006                         FACILITY:  [facility]   PHYSICIAN:  Claude Manges. Whitfield, M.D.DATE OF BIRTH:  11-10-1945   DATE OF ADMISSION:  10/13/2007  DATE OF DISCHARGE:  10/17/2007                               DISCHARGE SUMMARY   ADMITTING DIAGNOSIS:  Failed loose left total knee arthroplasty.   DISCHARGE DIAGNOSES:  1. Failed loose left total knee arthroplasty.  2. History of depression.  3. History of asthma.  4. History of rapid heart rate  5. Hypo-osmolality.  6. Postop hemorrhagic anemia.   PROCEDURE:  Revision total knee replacement.   HISTORY:  This is a 65 year old African American female, status post  right total knee arthroplasty in 2002 by Dr. Marcene Corning.  Recently,  has had a sudden recurrent pain in her knee, which is now constant,  extremely severe, sharp, and throbbing pain, which wakes her at night.  Activity worsens her symptoms.  She has radiographic loosening about the  femoral and tibial components.  Admitted now for revision of right total  knee arthroplasty.   HOSPITAL COURSE:  A 65 year old female admitted on October 13, 2007.  After  appropriate laboratory studies were obtained as well as vitamin D level  and ionized calcium, sed rate, and CRP, she was taken to the operating  room where she underwent a revision of the right total knee  arthroplasty.  She tolerated the procedure well.  She was placed on  Ancef 1 gram IV q.6 h. for 3 doses.  A Dilaudid PCA pump was used in  reduced-dose protocol.  Foley was placed intraoperatively.  Consultations with PT, OT, and care management were made.  She was  allowed to ambulate partial weightbearing 50% on the extremity.  CPM 0-  70 degrees for 6-8 hours per day, increasing 10 degrees per day.  Lovenox was started at 30 mg subcu every 12 hours on October 14, 2007, at  8:00 a.m. and  continued until the Coumadin became therapeutic.  She was  allowed out of bed to chair the following day.  On October 15, 2007, her  dressing was removed.  Doppler of the right lower extremity to rule out  DVT was ordered.  She had hypokalemia and was given 20 mEq of p.o.  potassium on October 15, 2007.  This was negative.  The remainder of her  hospital course was uneventful and she was discharged on October 17, 2007,  and to return back to the office in followup.  Dopplers of the right  lower extremity showed no evidence of DVT.  A three-phase bone scan of  September 21, 2007, reveals three-phase uptake about her right total knee  arthroplasty suspicious for loosening, especially when compared with  recent radiographs.   LABORATORY DATA:  Ionized calcium was 1.25.  Preop hemoglobin 14.2,  hematocrit 42.0%, white count 8700, and platelets 224,000.  Discharge  hemoglobin 10.8, hematocrit 31.9%, white count 12,700, and platelets  206,000.  Sed rate was 4.  Preop protime 13.2, INR  1.0, and PTT 29.  Discharge protime 23.0 and INR 2.0.  Preop sodium 136, potassium 4.6,  chloride 101, CO2 28, glucose 97, BUN 16, and creatinine 1.21.  Discharge sodium 138, potassium 4.3, chloride 105, CO2 24, glucose 118,  BUN 8, and creatinine 0.87.  GFR was 54.  Preop total protein 7.2,  albumin 4.3, AST 18, ALT 16, ALP 53, and total bilirubin 0.7.  C-  reactive protein was 0.1.  Vitamin D, which is 25-hydroxyvitamin D  level, was 11.  Urinalysis benign for void urine.  Blood type O  positive.  Antibody screen negative.  Tissue sent for culture.  Gram  stain showed no organisms.  Cultures revealed no growth after 3 days.  No anaerobes were noted.  Urine culture showed 1000 colonies of  insignificant growth.   DISCHARGE INSTRUCTIONS:  No restriction in diet.  Follow up wound  instruction sheet.  Change dressing daily.  Increase activity slowly.  Use a walker with 50% weightbearing.  May shower or bathe on Sunday.  No   lifting or driving for 6 weeks.  CPM 0-70 degrees for 6-8 hours per day,  increasing by 5-10 degrees per day.  Prescriptions for Percocet 5/325 mg  1 p.o. to 2 p.o. q.4 h. p.r.n. pain.  Robaxin 500 mg 1 tablet every 6  hours as needed for spasms.  Coumadin 5 mg as directed by Genevieve Norlander  Pharmacist.  Discharged in improved condition.  Follow back up with Dr.  Cleophas Dunker on October 28, 2007.      Renee Adkins, Renee Adkins.      Claude Manges. Cleophas Dunker, M.D.  Electronically Signed    BDP/MEDQ  D:  11/10/2007  T:  11/11/2007  Job:  [id number]

## 2011-04-08 ENCOUNTER — Encounter: Payer: Self-pay | Admitting: Internal Medicine

## 2011-04-09 ENCOUNTER — Ambulatory Visit (INDEPENDENT_AMBULATORY_CARE_PROVIDER_SITE_OTHER): Payer: Medicare Other | Admitting: Internal Medicine

## 2011-04-09 ENCOUNTER — Encounter: Payer: Self-pay | Admitting: Internal Medicine

## 2011-04-09 DIAGNOSIS — J45909 Unspecified asthma, uncomplicated: Secondary | ICD-10-CM

## 2011-04-09 DIAGNOSIS — I10 Essential (primary) hypertension: Secondary | ICD-10-CM

## 2011-04-09 MED ORDER — PREDNISONE (PAK) 10 MG PO TABS: ORAL_TABLET | ORAL | Status: AC

## 2011-04-09 NOTE — Progress Notes (Signed)
Subjective:     Patient ID: Renee Adkins, female   DOB: 1946/05/29, 65 y.o.   MRN: [id number]  HPI  1 yobf never smoker with problem of itching sneezing difficulty breathing intermittent since age 22's especially with grass much worse since 2008 comes and goes daily basis at baseline at rest day and night but also reproducible with slow adls, better at rest.   September 28, 2008 new consult persistent resting sob since hosp cone 12/9 -12/09 no better, then prednisone as outpt no better, no better with with allegra. Cough dry and shrill present day = night and causes gagging but no vomiting, not better with inhalers. Cough takes her breath away but also feels sob even when not coughing. rec stop actonel, take aciphex, stop acebutolol take bystolic.   October 12, 2008 ov reporting cough completely gone, no longer needs cough meds, suddenly looses breath even sitting still, never while sleeping. doe x across parking lot. using proaire twice a day and at night wakes up wheezing also better with proaire. added symbicort   October 27, 2008 ov no cough but no change with symbicort in terms of doe except no longer needing poaire. not able to use hfa so rec stop symbicort and replace it with Brovana and Budesonide, the equivalent of Symbicort, per neb and same day ct chest > limited emphysematous changes, thickened esophagus   July 19, 2009 ov states can walk 4 miles without stopping, quite happy with rx. rec no change   July 26, 2010 ov cc doe only with heavy exertion, no cough or noct co's or need for rescued. rec work on [employer], rule of 2s reviewed> improved and rare need for rescue.   04/09/2011 f/u ov/Forrester Blando cc 2 months nagging cough while on ACEI > mostly dry barking cough,  Day = night,  cxr done 3rd week of August reported nl. Already on protonix bid ac and ACEI d/c'd by Dr Azucena Kuba ? When. No sob. Sensation of tickle in throat - tessilon helps some.   Pt denies any significant sore throat,  dysphagia, itching, sneezing,  nasal congestion or excess/ purulent secretions,  fever, chills, sweats, unintended wt loss, pleuritic or exertional cp, hempoptysis, orthopnea pnd or leg swelling.  Also denies presyncope, palpitations, heartburn, abdominal pain, nausea, vomiting, diarrhea  or change in bowel or urinary habits, dysuria,hematuria,  rash, arthralgias, visual complaints, headache, numbness weakness or ataxia.  Also denies any obvious fluctuation of symptoms with weather or environmental changes or other aggravating or alleviating factors.    Past Medical History:  HYPERTENSION (ICD-401.9)  Unexplained DOE c/w mild chronic asthma plus ? element of gerd/lpr  - CT October 27, 2008 upper lobe emphysema and es thickening  - PFT's March 17, 2009 FEV1 1.55 (71%) ratio 65 no resp to B2, DLC0 44 corrects to 81%  Asthma  - hfa July 26, 2010 75% p coaching    Past Surgical History:  Hysterectomy 1978  R Knee TKR jan2011       Review of Systems     Objective:   Physical Exam     Pleasant mod obese bf amb nad harsh barking qualitiy upper airway cough wt 173 December 02, 2008 > 172 March 17, 2009 > 164 July 19, 2009 > 174 July 26, 2010 > 04/09/2011  179 HEENT: nl dentition, turbinates, and orophanx. Nl external ear canals without cough reflex  Neck without JVD/Nodes/TM  Lungs clear to A and P bilaterally without cough on insp or exp maneuvers  RRR no s3 or murmur or increase in P2  Abd soft and benign with nl excursion in the supine position. No bruits or organomegaly  Ext warm without calf tenderness, cyanosis clubbing or edema  Skin warm and dry without lesions Assessment:        Plan:          Outpatient Encounter Prescriptions as of 04/09/2011  Medication Sig Dispense Refill  . albuterol (PROVENTIL HFA;VENTOLIN HFA) 108 (90 BASE) MCG/ACT inhaler Inhale 2 puffs into the lungs every 6 (six) hours as needed.        Marland Kitchen arformoterol (BROVANA) 15 MCG/2ML NEBU Take 15 mcg  by nebulization 2 (two) times daily.        . benzonatate (TESSALON) 100 MG capsule Take 100 mg by mouth 3 (three) times daily as needed.        . budesonide (PULMICORT) 0.5 MG/2ML nebulizer solution Take 0.5 mg by nebulization 2 (two) times daily.        . citalopram (CELEXA) 40 MG tablet Take 40 mg by mouth 2 (two) times daily.        . cyclobenzaprine (FLEXERIL) 10 MG tablet Take 10 mg by mouth 3 (three) times daily as needed.        . diazepam (VALIUM) 5 MG tablet 1/2 to 1 tablet once daily as needed       . Eszopiclone (ESZOPICLONE) 3 MG TABS Take 3 mg by mouth at bedtime. As needed       . nebivolol (BYSTOLIC) 10 MG tablet Take 10 mg by mouth daily.        . pantoprazole (PROTONIX) 40 MG tablet Take 40 mg by mouth 2 (two) times daily.        . potassium chloride SA (K-DUR,KLOR-CON) 20 MEQ tablet Take 20 mEq by mouth daily.        Marland Kitchen torsemide (DEMADEX) 20 MG tablet Take 20 mg by mouth daily.        . predniSONE (STERAPRED UNI-PAK) 10 MG tablet Prednisone 10 mg take  4 each am x 2 days,   2 each am x 2 days,  1 each am x2days and stop  14 tablet  0

## 2011-04-09 NOTE — Assessment & Plan Note (Signed)
Pft's in 2010 suggest moderate chronic asthma which historically was well controlled until the last 2 months now with predominant cough that developed on ACEI  This is classic  Classic Upper airway cough syndrome, so named because it's frequently impossible to sort out how much is  CR/sinusitis with freq throat clearing (which can be related to primary GERD)   vs  causing  secondary (" extra esophageal")  GERD from wide swings in gastric pressure that occur with throat clearing, often  promoting self use of mint and menthol lozenges that reduce the lower esophageal sphincter tone and exacerbate the problem further in a cyclical fashion.   These are the same pts who not infrequently have failed to tolerate ace inhibitors,  dry powder inhalers or biphosphonates or report having reflux symptoms that don't respond to standard doses of PPI , and are easily confused as having aecopd or asthma flares,   Rec eliminate cyclical coughing is possible with continued max gerd rx and diet reviewed plus 6 day cycle of prednisone.  If not better return in two weeks for full medication reconciliation then go from there

## 2011-04-09 NOTE — Assessment & Plan Note (Signed)
ACE inhibitors are problematic in  pts with airway complaints because  even experienced pulmonologists can't always distinguish ace effects from copd/asthma.  By themselves they don't actually cause a problem, much like oxygen can't by itself start a fire, but they certainly serve as a powerful catalyst or enhancer for any "fire"  or inflammatory process in the upper airway, be it caused by an ET  tube or more commonly reflux (especially in the obese or pts with known GERD or who are on biphoshonates).    In the era of ARB near equivalency until we have a better handle on the reversibility of the airway problem, it just makes sense to avoid ACEI  entirely in the short run and then decide later, having established a level of airway control using a reasonable limited regimen, whether to add back ace but even then being very careful to observe the pt for worsening airway control and number of meds used/ needed to control symptoms.   Given her propensity to upper airway cough I would avoid this class completely/ indefinitely

## 2011-04-09 NOTE — Patient Instructions (Addendum)
You need to list all your allergies and keep a copy in your wallet but this should include ACE inhibitors.  Take  delsym two tsp every 12 hours and  tessilon pearls can be taken up to 2 every hours  GERD (REFLUX)  is an extremely common cause of respiratory symptoms, many times with no significant heartburn at all.    It can be treated with medication, but also with lifestyle changes including avoidance of late meals, excessive alcohol, smoking cessation, and avoid fatty foods, chocolate, peppermint, colas, red wine, and acidic juices such as orange juice.  NO MINT OR MENTHOL PRODUCTS SO NO COUGH DROPS  USE SUGARLESS CANDY INSTEAD (jolley ranchers or Stover's)  NO OIL BASED VITAMINS   If urge to clear throat clortrimeton 4mg  every 6 hours as needed  Prednisone 10 mg take  4 each am x 2 days,   2 each am x 2 days,  1 each am x2days and stop   We need to see you back in 2 weeks if not better See Tammy NP   with all your medications, even over the counter meds, separated in two separate bags, the ones you take no matter what vs the ones you stop once you feel better and take only as needed when you feel you need them.   Tammy  will generate for you a new user friendly medication calendar that will put [place] all on the same page re: your medication use.     Without this process, it simply isn't possible to assure that we are providing  your outpatient care  with  the attention to detail we feel you deserve.   If we cannot assure that you're getting that kind of care,  then we cannot manage your problem effectively from this clinic.  Once you have seen Tammy and we are sure that we're all on the same page with your medication use she will arrange follow up with me.

## 2011-04-09 NOTE — Progress Notes (Deleted)
dfs

## 2011-05-06 LAB — CBC
HCT: 31.4 — ABNORMAL LOW
HCT: 42
Hemoglobin: 10.6 — ABNORMAL LOW
Hemoglobin: 10.7 — ABNORMAL LOW
Hemoglobin: 10.8 — ABNORMAL LOW
Hemoglobin: 11.2 — ABNORMAL LOW
MCHC: 33.7
Platelets: 151
Platelets: 153
Platelets: 224
RBC: 3.41 — ABNORMAL LOW
RBC: 3.48 — ABNORMAL LOW
RBC: 3.48 — ABNORMAL LOW
RDW: 14.2
WBC: 10.4
WBC: 11.7 — ABNORMAL HIGH
WBC: 12.9 — ABNORMAL HIGH
WBC: 8.7

## 2011-05-06 LAB — URINE CULTURE: Colony Count: 1000

## 2011-05-06 LAB — APTT: aPTT: 29

## 2011-05-06 LAB — TISSUE CULTURE: Culture: NO GROWTH

## 2011-05-06 LAB — BASIC METABOLIC PANEL
CO2: 24
Calcium: 8.3 — ABNORMAL LOW
Calcium: 8.3 — ABNORMAL LOW
Calcium: 8.9
Chloride: 105
Chloride: 98
Creatinine, Ser: 0.87
GFR calc Af Amer: 55 — ABNORMAL LOW
GFR calc Af Amer: >60
GFR calc Af Amer: >60
GFR calc non Af Amer: 45 — ABNORMAL LOW
GFR calc non Af Amer: >60
Glucose, Bld: 123 — ABNORMAL HIGH
Potassium: 3.8
Potassium: 4.3
Potassium: 4.4
Sodium: 132 — ABNORMAL LOW
Sodium: 134 — ABNORMAL LOW
Sodium: 135
Sodium: 138

## 2011-05-06 LAB — COMPREHENSIVE METABOLIC PANEL
ALT: 16
AST: 18
Albumin: 4.3
Alkaline Phosphatase: 53
BUN: 16
Chloride: 101
Potassium: 4.6
Sodium: 136
Total Bilirubin: 0.7
Total Protein: 7.2

## 2011-05-06 LAB — PROTIME-INR
INR: 1.1
INR: 1.7 — ABNORMAL HIGH
INR: 2 — ABNORMAL HIGH
Prothrombin Time: 20.1 — ABNORMAL HIGH

## 2011-05-06 LAB — CROSSMATCH
ABO/RH(D): O POS
Antibody Screen: NEGATIVE

## 2011-05-06 LAB — ANAEROBIC CULTURE

## 2011-05-06 LAB — DIFFERENTIAL
Basophils Relative: 1
Eosinophils Absolute: 0.4
Eosinophils Relative: 5
Lymphs Abs: 3.6
Monocytes Absolute: 0.4
Monocytes Relative: 5

## 2011-05-06 LAB — URINALYSIS, ROUTINE W REFLEX MICROSCOPIC
Bilirubin Urine: NEGATIVE
Nitrite: NEGATIVE
Specific Gravity, Urine: 1.013
Urobilinogen, UA: 0.2
pH: 5

## 2011-05-06 LAB — GRAM STAIN

## 2011-05-06 LAB — VITAMIN D 25 HYDROXY (VIT D DEFICIENCY, FRACTURES): Vit D, 25-Hydroxy: 11 — ABNORMAL LOW (ref 30–89)

## 2011-05-16 LAB — CBC
HCT: 38 % (ref 36.0–46.0)
HCT: 38.9 % (ref 36.0–46.0)
HCT: 41.3 % (ref 36.0–46.0)
HCT: 42.3 % (ref 36.0–46.0)
Hemoglobin: 13.2 g/dL (ref 12.0–15.0)
Hemoglobin: 13.9 g/dL (ref 12.0–15.0)
MCHC: 33.7 g/dL (ref 30.0–36.0)
MCV: 91.3 fL (ref 78.0–100.0)
MCV: 92.2 fL (ref 78.0–100.0)
MCV: 92.4 fL (ref 78.0–100.0)
Platelets: 187 10*3/uL (ref 150–400)
Platelets: 188 10*3/uL (ref 150–400)
Platelets: 194 10*3/uL (ref 150–400)
RBC: 4.11 MIL/uL (ref 3.87–5.11)
RBC: 4.25 MIL/uL (ref 3.87–5.11)
RDW: 15.2 % (ref 11.5–15.5)
RDW: 15.5 % (ref 11.5–15.5)
WBC: 10.2 10*3/uL (ref 4.0–10.5)
WBC: 13.1 10*3/uL — ABNORMAL HIGH (ref 4.0–10.5)
WBC: 9.6 10*3/uL (ref 4.0–10.5)

## 2011-05-16 LAB — BASIC METABOLIC PANEL
BUN: 11 mg/dL (ref 6–23)
Chloride: 104 mEq/L (ref 96–112)
Potassium: 3.8 mEq/L (ref 3.5–5.1)

## 2011-05-16 LAB — CULTURE, BLOOD (ROUTINE X 2): Culture: NO GROWTH

## 2011-05-16 LAB — DIFFERENTIAL
Basophils Absolute: 0 10*3/uL (ref 0.0–0.1)
Basophils Absolute: 0.1 10*3/uL (ref 0.0–0.1)
Eosinophils Absolute: 0.4 10*3/uL (ref 0.0–0.7)
Eosinophils Relative: 2 % (ref 0–5)
Eosinophils Relative: 4 % (ref 0–5)
Eosinophils Relative: 6 % — ABNORMAL HIGH (ref 0–5)
Lymphocytes Relative: 31 % (ref 12–46)
Lymphocytes Relative: 35 % (ref 12–46)
Lymphocytes Relative: 36 % (ref 12–46)
Lymphs Abs: 3.7 10*3/uL (ref 0.7–4.0)
Lymphs Abs: 4 10*3/uL (ref 0.7–4.0)
Monocytes Absolute: 0.5 10*3/uL (ref 0.1–1.0)
Monocytes Absolute: 0.6 10*3/uL (ref 0.1–1.0)
Monocytes Absolute: 0.7 10*3/uL (ref 0.1–1.0)
Monocytes Absolute: 0.7 10*3/uL (ref 0.1–1.0)
Monocytes Relative: 5 % (ref 3–12)
Monocytes Relative: 6 % (ref 3–12)
Monocytes Relative: 6 % (ref 3–12)
Neutro Abs: 5.8 10*3/uL (ref 1.7–7.7)

## 2011-05-16 LAB — URINALYSIS, ROUTINE W REFLEX MICROSCOPIC
Ketones, ur: NEGATIVE mg/dL
Nitrite: NEGATIVE
Protein, ur: NEGATIVE mg/dL

## 2011-05-16 LAB — COMPREHENSIVE METABOLIC PANEL
ALT: 15 U/L (ref 0–35)
AST: 20 U/L (ref 0–37)
Albumin: 3.6 g/dL (ref 3.5–5.2)
Albumin: 3.7 g/dL (ref 3.5–5.2)
Alkaline Phosphatase: 69 U/L (ref 39–117)
BUN: 18 mg/dL (ref 6–23)
CO2: 21 mEq/L (ref 19–32)
Chloride: 105 mEq/L (ref 96–112)
Chloride: 112 mEq/L (ref 96–112)
Creatinine, Ser: 1.13 mg/dL (ref 0.4–1.2)
Creatinine, Ser: 1.21 mg/dL — ABNORMAL HIGH (ref 0.4–1.2)
GFR calc Af Amer: 54 mL/min — ABNORMAL LOW (ref >60)
GFR calc non Af Amer: 46 mL/min — ABNORMAL LOW (ref >60)
Glucose, Bld: 182 mg/dL — ABNORMAL HIGH (ref 70–99)
Potassium: 4 mEq/L (ref 3.5–5.1)
Sodium: 142 mEq/L (ref 135–145)
Sodium: 143 mEq/L (ref 135–145)
Total Bilirubin: 0.5 mg/dL (ref 0.3–1.2)
Total Bilirubin: 0.5 mg/dL (ref 0.3–1.2)
Total Protein: 6.8 g/dL (ref 6.0–8.3)
Total Protein: 6.9 g/dL (ref 6.0–8.3)

## 2011-05-27 LAB — URINALYSIS, ROUTINE W REFLEX MICROSCOPIC
Glucose, UA: NEGATIVE
Nitrite: NEGATIVE
Protein, ur: NEGATIVE
Urobilinogen, UA: 0.2

## 2011-05-27 LAB — DIFFERENTIAL
Basophils Absolute: 0.1
Basophils Relative: 1
Eosinophils Relative: 5
Monocytes Absolute: 0.9 — ABNORMAL HIGH
Monocytes Relative: 8

## 2011-05-27 LAB — I-STAT 8, (EC8 V) (CONVERTED LAB)
Bicarbonate: 21.9
Glucose, Bld: 97
HCT: 40
Hemoglobin: 13.6

## 2011-05-27 LAB — CBC
HCT: 38.4
Hemoglobin: 13
MCHC: 34
MCV: 90.2
RBC: 4.25
RDW: 13.4

## 2011-05-27 LAB — POCT I-STAT CREATININE: Creatinine, Ser: 1.1

## 2011-05-27 LAB — RAPID URINE DRUG SCREEN, HOSP PERFORMED
Benzodiazepines: POSITIVE — AB
Tetrahydrocannabinol: NOT DETECTED

## 2011-05-27 LAB — URINE MICROSCOPIC-ADD ON

## 2012-01-25 ENCOUNTER — Emergency Department (HOSPITAL_COMMUNITY): Payer: Medicare Other

## 2012-01-25 ENCOUNTER — Inpatient Hospital Stay (HOSPITAL_COMMUNITY)
Admission: EM | Admit: 2012-01-25 | Discharge: 2012-01-27 | DRG: 313 | Disposition: A | Discharge status: Home / Self Care | Payer: Medicare Other | Attending: Family Medicine | Admitting: Family Medicine

## 2012-01-25 ENCOUNTER — Encounter (HOSPITAL_COMMUNITY): Payer: Self-pay | Admitting: *Deleted

## 2012-01-25 DIAGNOSIS — R7989 Other specified abnormal findings of blood chemistry: Secondary | ICD-10-CM | POA: Diagnosis present

## 2012-01-25 DIAGNOSIS — F419 Anxiety disorder, unspecified: Secondary | ICD-10-CM

## 2012-01-25 DIAGNOSIS — E860 Dehydration: Secondary | ICD-10-CM | POA: Diagnosis present

## 2012-01-25 DIAGNOSIS — F411 Generalized anxiety disorder: Secondary | ICD-10-CM | POA: Diagnosis present

## 2012-01-25 DIAGNOSIS — J45909 Unspecified asthma, uncomplicated: Secondary | ICD-10-CM

## 2012-01-25 DIAGNOSIS — I10 Essential (primary) hypertension: Secondary | ICD-10-CM

## 2012-01-25 DIAGNOSIS — R0789 Other chest pain: Principal | ICD-10-CM | POA: Diagnosis present

## 2012-01-25 DIAGNOSIS — K219 Gastro-esophageal reflux disease without esophagitis: Secondary | ICD-10-CM | POA: Diagnosis present

## 2012-01-25 DIAGNOSIS — E876 Hypokalemia: Secondary | ICD-10-CM

## 2012-01-25 DIAGNOSIS — R079 Chest pain, unspecified: Secondary | ICD-10-CM | POA: Diagnosis present

## 2012-01-25 DIAGNOSIS — N179 Acute kidney failure, unspecified: Secondary | ICD-10-CM

## 2012-01-25 DIAGNOSIS — N289 Disorder of kidney and ureter, unspecified: Secondary | ICD-10-CM

## 2012-01-25 DIAGNOSIS — R202 Paresthesia of skin: Secondary | ICD-10-CM | POA: Diagnosis present

## 2012-01-25 HISTORY — DX: Anxiety disorder, unspecified: F41.9

## 2012-01-25 LAB — POCT I-STAT, CHEM 8
Calcium, Ion: 1.14 mmol/L (ref 1.12–1.32)
Chloride: 99 mEq/L (ref 96–112)
Glucose, Bld: 90 mg/dL (ref 70–99)
HCT: 39 % (ref 36.0–46.0)

## 2012-01-25 MED ORDER — KETOROLAC TROMETHAMINE 30 MG/ML IJ SOLN
INTRAMUSCULAR | Status: AC
  Administered 2012-01-25: 30 mg
  Filled 2012-01-25: qty 1

## 2012-01-25 MED ORDER — POTASSIUM CHLORIDE CRYS ER 20 MEQ PO TBCR
40.0000 meq | EXTENDED_RELEASE_TABLET | Freq: Once | ORAL | Status: AC
  Administered 2012-01-25: 40 meq via ORAL
  Filled 2012-01-25: qty 2

## 2012-01-25 MED ORDER — POTASSIUM CHLORIDE 10 MEQ/100ML IV SOLN
10.0000 meq | INTRAVENOUS | Status: AC
  Administered 2012-01-25 – 2012-01-26 (×3): 10 meq via INTRAVENOUS
  Filled 2012-01-25 (×2): qty 100

## 2012-01-25 MED ORDER — LORAZEPAM 2 MG/ML IJ SOLN
1.0000 mg | Freq: Once | INTRAMUSCULAR | Status: AC
  Administered 2012-01-25: 1 mg via INTRAVENOUS
  Filled 2012-01-25: qty 1

## 2012-01-25 MED ORDER — KETOROLAC TROMETHAMINE 30 MG/ML IJ SOLN
30.0000 mg | Freq: Once | INTRAMUSCULAR | Status: AC
  Administered 2012-01-25: 30 mg via INTRAVENOUS

## 2012-01-25 MED ORDER — HYDROMORPHONE HCL PF 1 MG/ML IJ SOLN
1.0000 mg | INTRAMUSCULAR | Status: DC | PRN
  Administered 2012-01-25 – 2012-01-26 (×3): 1 mg via INTRAVENOUS
  Filled 2012-01-25 (×3): qty 1

## 2012-01-25 NOTE — H&P (Signed)
PCP:   [name], MD   Chief Complaint: Chest pain   HPI: Renee Adkins is an 66 y.o. female with history of anxiety, GERD, hypertension, asthma, presents to the emergency room with only a few minutes of substernal chest pressure, radiate to her left shoulder, without accompanying shortness of breath, nausea, vomiting, or diaphoresis. She does have occasional substernal chest pain, without exertion. She also has history of GERD and has been on PPI for which she has been compliant. Evaluation in emergency room included a negative cardiac markers, unremarkable EKG, unremarkable chest x-ray and cervical spine films. She was found to have slight elevation of her creatinine to 1.3, and a potassium of 2.9. Hospitalist was was asked to admit her for cardiac rule out.  Rewiew of Systems:  The patient denies anorexia, fever, weight loss,, vision loss, decreased hearing, hoarseness, , syncope, dyspnea on exertion, peripheral edema, balance deficits, hemoptysis, abdominal pain, melena, hematochezia, severe indigestion/heartburn, hematuria, incontinence, genital sores, muscle weakness, suspicious skin lesions, transient blindness, difficulty walking, depression, unusual weight change, abnormal bleeding, enlarged lymph nodes, angioedema, and breast masses.    Past Medical History  Diagnosis Date  . Hypertension   . Asthma     Past Surgical History  Procedure Date  . Vesicovaginal fistula closure w/ tah   . Total knee arthroplasty Jan 2011    rt knee    Medications:  HOME MEDS: Prior to Admission medications   Medication Sig Start Date End Date Taking? Authorizing Provider  albuterol (PROVENTIL HFA;VENTOLIN HFA) 108 (90 BASE) MCG/ACT inhaler Inhale 2 puffs into the lungs every 6 (six) hours as needed.      Historical Provider, MD  arformoterol (BROVANA) 15 MCG/2ML NEBU Take 15 mcg by nebulization 2 (two) times daily.      Historical Provider, MD  benzonatate (TESSALON) 100 MG  capsule Take 100 mg by mouth 3 (three) times daily as needed.      Historical Provider, MD  budesonide (PULMICORT) 0.5 MG/2ML nebulizer solution Take 0.5 mg by nebulization 2 (two) times daily.      Historical Provider, MD  citalopram (CELEXA) 40 MG tablet Take 40 mg by mouth 2 (two) times daily.      Historical Provider, MD  cyclobenzaprine (FLEXERIL) 10 MG tablet Take 10 mg by mouth 3 (three) times daily as needed.      Historical Provider, MD  diazepam (VALIUM) 5 MG tablet 1/2 to 1 tablet once daily as needed     Historical Provider, MD  Eszopiclone (ESZOPICLONE) 3 MG TABS Take 3 mg by mouth at bedtime. As needed     Historical Provider, MD  nebivolol (BYSTOLIC) 10 MG tablet Take 10 mg by mouth daily.      Historical Provider, MD  pantoprazole (PROTONIX) 40 MG tablet Take 40 mg by mouth 2 (two) times daily.      Historical Provider, MD  potassium chloride SA (K-DUR,KLOR-CON) 20 MEQ tablet Take 20 mEq by mouth daily.      Historical Provider, MD  torsemide (DEMADEX) 20 MG tablet Take 20 mg by mouth daily.      Historical Provider, MD     Allergies:  Allergies  Allergen Reactions  . Codeine     REACTION: tachycardia    Social History:   reports that she has never smoked. She has never used smokeless tobacco. She reports that she does not drink alcohol or use illicit drugs.  Family History: Family History  Problem Relation Age of Onset  . Asthma  Neg Hx   . Allergies Neg Hx   . Heart attack Father 66     Physical Exam: Filed Vitals:   01/25/12 1805 01/25/12 1815  BP:  131/68  SpO2: 99%    Blood pressure 131/68, SpO2 99.00%.  GEN:  Pleasant  person lying in the stretcher in no acute distress; cooperative with exam PSYCH:  alert and oriented x4;  appear anxious but not depressed; affect is appropriate. HEENT: Mucous membranes pink and anicteric; PERRLA; EOM intact; no cervical lymphadenopathy nor thyromegaly or carotid bruit; no JVD; Breasts:: Not examined CHEST WALL: No  tenderness CHEST: Normal respiration, clear to auscultation bilaterally HEART: Regular rate and rhythm; no murmurs rubs or gallops BACK: No kyphosis or scoliosis; no CVA tenderness ABDOMEN: Obese, soft non-tender; no masses, no organomegaly, normal abdominal bowel sounds; no pannus; no intertriginous candida. Rectal Exam: Not done EXTREMITIES: No bone or joint deformity; age-appropriate arthropathy of the hands and knees; no edema; no ulcerations. Genitalia: not examined PULSES: 2+ and symmetric SKIN: Normal hydration no rash or ulceration CNS: Cranial nerves 2-12 grossly intact no focal lateralizing neurologic deficit   Labs & Imaging Results for orders placed during the hospital encounter of 01/25/12 (from the past 48 hour(s))  TROPONIN I     Status: Normal   Collection Time   01/25/12  6:23 PM      Component Value Range Comment   Troponin I <0.30  <0.30 ng/mL   POCT I-STAT, CHEM 8     Status: Abnormal   Collection Time   01/25/12  6:41 PM      Component Value Range Comment   Sodium 141  135 - 145 mEq/L    Potassium 2.9 (*) 3.5 - 5.1 mEq/L    Chloride 99  96 - 112 mEq/L    BUN 17  6 - 23 mg/dL    Creatinine, Ser 1.61 (*) 0.50 - 1.10 mg/dL    Glucose, Bld 90  70 - 99 mg/dL    Calcium, Ion 0.96  0.45 - 1.32 mmol/L    TCO2 26  0 - 100 mmol/L    Hemoglobin 13.3  12.0 - 15.0 g/dL    HCT 40.9  81.1 - 91.4 %    Dg Chest 2 View  01/25/2012  *RADIOLOGY REPORT*  Clinical Data: Chest pain and shortness of breath.  CHEST - 2 VIEW  Comparison: Chest x-ray 03/29/2011.  Findings: Lung volumes are normal.  No consolidative airspace disease.  No pleural effusions.  No pneumothorax.  No pulmonary nodule or mass noted.  Pulmonary vasculature and the cardiomediastinal silhouette are within normal limits.  IMPRESSION: 1. No radiographic evidence of acute cardiopulmonary disease.  Original Report Authenticated By: [name], M.D.   Dg Cervical Spine 2-3 Views  01/25/2012  *RADIOLOGY REPORT*   Clinical Data: Numbness and tingling in the right hand.  Chest pain.  CERVICAL SPINE - 2-3 VIEW  Comparison: None.  Findings: AP and lateral views demonstrate no fracture, dislocation, prevertebral soft tissue swelling, facet arthritis, or disc space narrowing.  IMPRESSION: No significant abnormality of the cervical spine.  There appears to be dense calcification in the right carotid bifurcation.  Original Report Authenticated By: [name], M.D.      Assessment Present on Admission:  .Chest pain at rest .HYPERTENSION .GERD (gastroesophageal reflux disease) Hypokalemia Dehydration with slight elevated creatinine   PLAN: 66 year old with some cardiac risk factors admitted for atypical chest pain. I do not think that this is acute coronary syndrome  nor cardiac pain. She is quite anxious and has history of GERD which could be the cause as well. She was given aspirin, and is currently pain-free. Will obtain a cardiac echo and rule out with serial CPKs and troponins. She does have dehydration and hypokalemia which undoubtedly because of her diuretic. Will hold diuretic replete her potassium and give her IV fluid. Will follow her creatinine carefully. She is stable, full code, and will be admitted to [facility].  I suspect she'll need her stress test, but this can certainly be done as an outpatient.   Other plans as per orders.    Renee Adkins 01/25/2012, 8:46 PM

## 2012-01-25 NOTE — ED Provider Notes (Signed)
History     CSN: [id number]  Arrival date & time 01/25/12  1805   First MD Initiated Contact with Patient 01/25/12 1807      Chief Complaint  Patient presents with  . Chest Pain    HPI PT called EMS after a sudden onset of CP , center cp like pressure. Pt also repoted numbness and tingling on RT side.   Past Medical History  Diagnosis Date  . Hypertension   . Asthma     Past Surgical History  Procedure Date  . Vesicovaginal fistula closure w/ tah   . Total knee arthroplasty Jan 2011    rt knee    Family History  Problem Relation Age of Onset  . Asthma Neg Hx   . Allergies Neg Hx   . Heart attack Father 64    History  Substance Use Topics  . Smoking status: Never Smoker   . Smokeless tobacco: Never Used  . Alcohol Use: No    OB History    Grav Para Term Preterm Abortions TAB SAB Ect Mult Living                  Review of Systems  All other systems reviewed and are negative.    Allergies  Codeine  Home Medications   Current Outpatient Rx  Name Route Sig Dispense Refill  . ALBUTEROL SULFATE HFA 108 (90 BASE) MCG/ACT IN AERS Inhalation Inhale 2 puffs into the lungs every 6 (six) hours as needed.      . ARFORMOTEROL TARTRATE 15 MCG/2ML IN NEBU Nebulization Take 15 mcg by nebulization 2 (two) times daily.      Marland Kitchen BENZONATATE 100 MG PO CAPS Oral Take 100 mg by mouth 3 (three) times daily as needed.      . BUDESONIDE 0.5 MG/2ML IN SUSP Nebulization Take 0.5 mg by nebulization 2 (two) times daily.      Marland Kitchen CITALOPRAM HYDROBROMIDE 40 MG PO TABS Oral Take 40 mg by mouth 2 (two) times daily.      . CYCLOBENZAPRINE HCL 10 MG PO TABS Oral Take 10 mg by mouth 3 (three) times daily as needed.      Marland Kitchen DIAZEPAM 5 MG PO TABS  1/2 to 1 tablet once daily as needed     . ESZOPICLONE 3 MG PO TABS Oral Take 3 mg by mouth at bedtime. As needed     . NEBIVOLOL HCL 10 MG PO TABS Oral Take 10 mg by mouth daily.      Marland Kitchen PANTOPRAZOLE SODIUM 40 MG PO TBEC Oral Take 40 mg by mouth 2  (two) times daily.      Marland Kitchen POTASSIUM CHLORIDE CRYS ER 20 MEQ PO TBCR Oral Take 20 mEq by mouth daily.      . TORSEMIDE 20 MG PO TABS Oral Take 20 mg by mouth daily.        BP 131/68  SpO2 99%  Physical Exam  Nursing note and vitals reviewed. Constitutional: She is oriented to person, place, and time. She appears well-developed and well-nourished. No distress.  HENT:  Head: Normocephalic and atraumatic.  Eyes: Pupils are equal, round, and reactive to light.  Neck: Normal range of motion.  Cardiovascular: Normal rate and intact distal pulses.        Normal sinus rhythm Rate = 79 Axis = normal QRS = 76 Interpretation: Borderline T abnormalities otherwise normal EKG  Pulmonary/Chest: No respiratory distress. She has no wheezes. She has no rales.  Abdominal: Normal appearance.  She exhibits no distension.  Musculoskeletal: Normal range of motion.  Neurological: She is alert and oriented to person, place, and time. No cranial nerve deficit.  Skin: Skin is warm and dry. No rash noted.  Psychiatric: She has a normal mood and affect. Her behavior is normal.    ED Course  Procedures (including critical care time)   Scheduled Meds:   . potassium chloride  40 mEq Oral Once   Continuous Infusions:  PRN Meds:.  Labs Reviewed  POCT I-STAT, CHEM 8 - Abnormal; Notable for the following:    Potassium 2.9 (*)     Creatinine, Ser 1.30 (*)     All other components within normal limits  TROPONIN I   Dg Chest 2 View  01/25/2012  *RADIOLOGY REPORT*  Clinical Data: Chest pain and shortness of breath.  CHEST - 2 VIEW  Comparison: Chest x-ray 03/29/2011.  Findings: Lung volumes are normal.  No consolidative airspace disease.  No pleural effusions.  No pneumothorax.  No pulmonary nodule or mass noted.  Pulmonary vasculature and the cardiomediastinal silhouette are within normal limits.  IMPRESSION: 1. No radiographic evidence of acute cardiopulmonary disease.  Original Report Authenticated By:  [name], M.D.   Dg Cervical Spine 2-3 Views  01/25/2012  *RADIOLOGY REPORT*  Clinical Data: Numbness and tingling in the right hand.  Chest pain.  CERVICAL SPINE - 2-3 VIEW  Comparison: None.  Findings: AP and lateral views demonstrate no fracture, dislocation, prevertebral soft tissue swelling, facet arthritis, or disc space narrowing.  IMPRESSION: No significant abnormality of the cervical spine.  There appears to be dense calcification in the right carotid bifurcation.  Original Report Authenticated By: [name], M.D.     1. Hypokalemia   2. Chest pain   3. Renal insufficiency       MDM         Nelia Shi, MD 01/25/12 [phone]

## 2012-01-25 NOTE — ED Notes (Signed)
PT called EMS after a sudden onset of CP , center cp like pressure. Pt also repoted numbness and tingling on RT side.

## 2012-01-26 ENCOUNTER — Inpatient Hospital Stay (HOSPITAL_COMMUNITY): Payer: Medicare Other

## 2012-01-26 DIAGNOSIS — I10 Essential (primary) hypertension: Secondary | ICD-10-CM

## 2012-01-26 DIAGNOSIS — R209 Unspecified disturbances of skin sensation: Secondary | ICD-10-CM

## 2012-01-26 DIAGNOSIS — E876 Hypokalemia: Secondary | ICD-10-CM

## 2012-01-26 DIAGNOSIS — F411 Generalized anxiety disorder: Secondary | ICD-10-CM

## 2012-01-26 LAB — CBC
HCT: 36.5 % (ref 36.0–46.0)
Hemoglobin: 12.2 g/dL (ref 12.0–15.0)
Hemoglobin: 12.7 g/dL (ref 12.0–15.0)
MCH: 29.4 pg (ref 26.0–34.0)
MCH: 29.8 pg (ref 26.0–34.0)
MCHC: 33.4 g/dL (ref 30.0–36.0)
MCHC: 33.9 g/dL (ref 30.0–36.0)
MCV: 88 fL (ref 78.0–100.0)

## 2012-01-26 LAB — CARDIAC PANEL(CRET KIN+CKTOT+MB+TROPI)
CK, MB: 1.6 ng/mL (ref 0.3–4.0)
Relative Index: 1.3 (ref 0.0–2.5)
Relative Index: 1.3 (ref 0.0–2.5)
Total CK: 126 U/L (ref 7–177)
Troponin I: <0.3 ng/mL (ref <0.30)

## 2012-01-26 LAB — BASIC METABOLIC PANEL
BUN: 18 mg/dL (ref 6–23)
CO2: 26 mEq/L (ref 19–32)
GFR calc non Af Amer: 40 mL/min — ABNORMAL LOW (ref >90)
Glucose, Bld: 119 mg/dL — ABNORMAL HIGH (ref 70–99)
Potassium: 3.9 mEq/L (ref 3.5–5.1)

## 2012-01-26 LAB — FOLATE: Folate: 10.5 ng/mL

## 2012-01-26 LAB — TSH: TSH: 2.924 u[IU]/mL (ref 0.350–4.500)

## 2012-01-26 LAB — VITAMIN B12: Vitamin B-12: 959 pg/mL — ABNORMAL HIGH (ref 211–911)

## 2012-01-26 LAB — CREATININE, SERUM: Creatinine, Ser: 1.44 mg/dL — ABNORMAL HIGH (ref 0.50–1.10)

## 2012-01-26 MED ORDER — POTASSIUM CHLORIDE 10 MEQ/100ML IV SOLN
10.0000 meq | Freq: Once | INTRAVENOUS | Status: AC
  Administered 2012-01-26: 10 meq via INTRAVENOUS
  Filled 2012-01-26: qty 100

## 2012-01-26 MED ORDER — DIAZEPAM 5 MG PO TABS
5.0000 mg | ORAL_TABLET | Freq: Three times a day (TID) | ORAL | Status: DC | PRN
  Administered 2012-01-26 – 2012-01-27 (×2): 5 mg via ORAL
  Filled 2012-01-26 (×2): qty 1

## 2012-01-26 MED ORDER — ASPIRIN EC 325 MG PO TBEC
325.0000 mg | DELAYED_RELEASE_TABLET | Freq: Every day | ORAL | Status: DC
  Administered 2012-01-26: 325 mg via ORAL
  Filled 2012-01-26: qty 1

## 2012-01-26 MED ORDER — ALUM & MAG HYDROXIDE-SIMETH 200-200-20 MG/5ML PO SUSP: 30.0000 mL | Freq: Four times a day (QID) | ORAL | Status: DC | PRN

## 2012-01-26 MED ORDER — ENOXAPARIN SODIUM 40 MG/0.4ML ~~LOC~~ SOLN
40.0000 mg | Freq: Every day | SUBCUTANEOUS | Status: DC
  Administered 2012-01-26: 40 mg via SUBCUTANEOUS
  Filled 2012-01-26: qty 0.4

## 2012-01-26 MED ORDER — NEBIVOLOL HCL 10 MG PO TABS
10.0000 mg | ORAL_TABLET | Freq: Every day | ORAL | Status: DC
  Administered 2012-01-26 – 2012-01-27 (×2): 10 mg via ORAL
  Filled 2012-01-26 (×2): qty 1

## 2012-01-26 MED ORDER — ASPIRIN EC 81 MG PO TBEC
81.0000 mg | DELAYED_RELEASE_TABLET | Freq: Every day | ORAL | Status: DC
  Administered 2012-01-27: 81 mg via ORAL
  Filled 2012-01-26: qty 1

## 2012-01-26 MED ORDER — POTASSIUM CHLORIDE 10 MEQ/100ML IV SOLN
10.0000 meq | INTRAVENOUS | Status: AC
  Administered 2012-01-26 (×2): 10 meq via INTRAVENOUS
  Filled 2012-01-26 (×2): qty 100

## 2012-01-26 MED ORDER — ZOLPIDEM TARTRATE 5 MG PO TABS
5.0000 mg | ORAL_TABLET | Freq: Every evening | ORAL | Status: DC | PRN
  Administered 2012-01-26 – 2012-01-27 (×2): 5 mg via ORAL
  Filled 2012-01-26 (×2): qty 1

## 2012-01-26 MED ORDER — ALBUTEROL SULFATE HFA 108 (90 BASE) MCG/ACT IN AERS
2.0000 | INHALATION_SPRAY | Freq: Four times a day (QID) | RESPIRATORY_TRACT | Status: DC | PRN
  Administered 2012-01-27: 2 via RESPIRATORY_TRACT
  Filled 2012-01-26: qty 6.7

## 2012-01-26 MED ORDER — POTASSIUM CHLORIDE CRYS ER 20 MEQ PO TBCR
20.0000 meq | EXTENDED_RELEASE_TABLET | Freq: Every day | ORAL | Status: DC
  Administered 2012-01-26 – 2012-01-27 (×2): 20 meq via ORAL
  Filled 2012-01-26 (×2): qty 1

## 2012-01-26 MED ORDER — OXYCODONE HCL 5 MG PO TABS: 5.0000 mg | ORAL_TABLET | ORAL | Status: DC | PRN

## 2012-01-26 MED ORDER — ARFORMOTEROL TARTRATE 15 MCG/2ML IN NEBU
15.0000 ug | INHALATION_SOLUTION | Freq: Two times a day (BID) | RESPIRATORY_TRACT | Status: DC
  Administered 2012-01-26 – 2012-01-27 (×2): 15 ug via RESPIRATORY_TRACT
  Filled 2012-01-26 (×6): qty 2

## 2012-01-26 MED ORDER — BUDESONIDE 0.5 MG/2ML IN SUSP
0.5000 mg | Freq: Two times a day (BID) | RESPIRATORY_TRACT | Status: DC
  Administered 2012-01-26 – 2012-01-27 (×3): 0.5 mg via RESPIRATORY_TRACT
  Filled 2012-01-26 (×5): qty 2

## 2012-01-26 MED ORDER — POTASSIUM CHLORIDE CRYS ER 20 MEQ PO TBCR
40.0000 meq | EXTENDED_RELEASE_TABLET | Freq: Every day | ORAL | Status: DC
  Filled 2012-01-26: qty 2

## 2012-01-26 MED ORDER — SODIUM CHLORIDE 0.9 % IV SOLN
INTRAVENOUS | Status: AC
  Administered 2012-01-26: 01:00:00 via INTRAVENOUS

## 2012-01-26 MED ORDER — SODIUM CHLORIDE 0.9 % IV SOLN
INTRAVENOUS | Status: DC
  Administered 2012-01-26 – 2012-01-27 (×2): via INTRAVENOUS

## 2012-01-26 MED ORDER — CITALOPRAM HYDROBROMIDE 40 MG PO TABS
40.0000 mg | ORAL_TABLET | Freq: Two times a day (BID) | ORAL | Status: DC
  Filled 2012-01-26 (×2): qty 1

## 2012-01-26 MED ORDER — PANTOPRAZOLE SODIUM 40 MG PO TBEC
40.0000 mg | DELAYED_RELEASE_TABLET | Freq: Two times a day (BID) | ORAL | Status: DC
  Administered 2012-01-26 – 2012-01-27 (×3): 40 mg via ORAL
  Filled 2012-01-26 (×3): qty 1

## 2012-01-26 MED ORDER — CITALOPRAM HYDROBROMIDE 20 MG PO TABS
20.0000 mg | ORAL_TABLET | Freq: Every day | ORAL | Status: DC
  Administered 2012-01-26 – 2012-01-27 (×2): 20 mg via ORAL
  Filled 2012-01-26 (×2): qty 1

## 2012-01-26 MED ORDER — GADOBENATE DIMEGLUMINE 529 MG/ML IV SOLN
17.0000 mL | Freq: Once | INTRAVENOUS | Status: AC | PRN
  Administered 2012-01-26: 17 mL via INTRAVENOUS

## 2012-01-26 MED ORDER — ONDANSETRON HCL 4 MG/2ML IJ SOLN: 4.0000 mg | Freq: Three times a day (TID) | INTRAMUSCULAR | Status: AC | PRN

## 2012-01-26 MED ORDER — CYCLOBENZAPRINE HCL 10 MG PO TABS: 10.0000 mg | ORAL_TABLET | Freq: Three times a day (TID) | ORAL | Status: DC | PRN

## 2012-01-26 MED ORDER — KCL IN DEXTROSE-NACL 40-5-0.9 MEQ/L-%-% IV SOLN
INTRAVENOUS | Status: DC
  Administered 2012-01-26 (×2): via INTRAVENOUS
  Filled 2012-01-26 (×3): qty 1000

## 2012-01-26 NOTE — Progress Notes (Signed)
  Echocardiogram 2D Echocardiogram has been performed.  Claribel Sachs L 01/26/2012, 10:45 AM

## 2012-01-26 NOTE — Progress Notes (Signed)
Report called pt going to [id number] after mri via bed. Belongings sent to new room. Renee Adkins
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
## 2012-01-27 DIAGNOSIS — N179 Acute kidney failure, unspecified: Secondary | ICD-10-CM

## 2012-01-27 DIAGNOSIS — J45909 Unspecified asthma, uncomplicated: Secondary | ICD-10-CM

## 2012-01-27 DIAGNOSIS — R079 Chest pain, unspecified: Secondary | ICD-10-CM

## 2012-01-27 DIAGNOSIS — R202 Paresthesia of skin: Secondary | ICD-10-CM

## 2012-01-27 DIAGNOSIS — E876 Hypokalemia: Secondary | ICD-10-CM

## 2012-01-27 DIAGNOSIS — R209 Unspecified disturbances of skin sensation: Secondary | ICD-10-CM

## 2012-01-27 DIAGNOSIS — R7989 Other specified abnormal findings of blood chemistry: Secondary | ICD-10-CM | POA: Diagnosis present

## 2012-01-27 HISTORY — DX: Paresthesia of skin: R20.2

## 2012-01-27 LAB — BASIC METABOLIC PANEL
CO2: 24 mEq/L (ref 19–32)
Chloride: 108 mEq/L (ref 96–112)
GFR calc Af Amer: 55 mL/min — ABNORMAL LOW (ref >90)
Potassium: 4.3 mEq/L (ref 3.5–5.1)
Sodium: 141 mEq/L (ref 135–145)

## 2012-01-27 MED ORDER — ASPIRIN 81 MG PO TBEC: 81.0000 mg | DELAYED_RELEASE_TABLET | Freq: Every day | ORAL | Status: AC
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
## 2012-01-27 NOTE — Care Management Note (Signed)
    Page 1 of 1   01/27/2012     3:42:43 PM   CARE MANAGEMENT NOTE 01/27/2012  Patient:  Renee Adkins, Renee Adkins   Account Number:  [id number]  Date Initiated:  01/27/2012  Documentation initiated by:  Jacquelynn Cree  Subjective/Objective Assessment:   Admitted with chest pain     Action/Plan:   Anticipated DC Date:  01/27/2012   Anticipated DC Plan:  HOME/SELF CARE      DC Planning Services  CM consult      Choice offered to / List presented to:             Status of service:  Completed, signed off Medicare Important Message given?   (If response is "NO", the following Medicare IM given date fields will be blank) Date Medicare IM given:   Date Additional Medicare IM given:    Discharge Disposition:  HOME/SELF CARE  Per UR Regulation:  Reviewed for med. necessity/level of care/duration of stay  If discussed at Long Length of Stay Meetings, dates discussed:    Comments:
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
## 2012-01-29 ENCOUNTER — Encounter: Payer: Self-pay | Admitting: Cardiovascular Disease

## 2012-01-29 ENCOUNTER — Ambulatory Visit (INDEPENDENT_AMBULATORY_CARE_PROVIDER_SITE_OTHER): Payer: Medicare Other | Admitting: Cardiovascular Disease

## 2012-01-29 VITALS — BP 130/78 | HR 87 | Ht 59.5 in | Wt 174.0 lb

## 2012-01-29 DIAGNOSIS — I5032 Chronic diastolic (congestive) heart failure: Secondary | ICD-10-CM

## 2012-01-29 DIAGNOSIS — I10 Essential (primary) hypertension: Secondary | ICD-10-CM

## 2012-01-29 DIAGNOSIS — I509 Heart failure, unspecified: Secondary | ICD-10-CM

## 2012-01-29 HISTORY — DX: Chronic diastolic (congestive) heart failure: I50.32

## 2012-01-29 MED ORDER — HYDROCHLOROTHIAZIDE 25 MG PO TABS: 25.0000 mg | ORAL_TABLET | Freq: Every day | ORAL | Status: DC

## 2012-01-29 MED ORDER — POTASSIUM CHLORIDE CRYS ER 20 MEQ PO TBCR: 20.0000 meq | EXTENDED_RELEASE_TABLET | Freq: Two times a day (BID) | ORAL | Status: DC
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
## 2012-01-29 NOTE — Patient Instructions (Addendum)
Your physician has recommended you make the following change in your medication:   STOP DEMADEX  START HCTZ 25 MG DAILY IN MORNING INCREASE POTASSIUM/ KDUR FROM 20 MEQ ONCE DAILY TO TWICE DAILY   Your physician recommends that you return for lab work in:  2 WEEKS LAB DRAW ONLY/ BMET  Your physician recommends that you schedule a follow-up appointment in: 3 MONTHS //EKG  REDUCE HIGH SODIUM FOODS LIKE CANNED SOUP, GRAVY, SAUCES, READY PREPARED FOODS LIKE FROZEN FOODS; LEAN CUISINE, LASAGNA. BACON, SAUSAGE, LUNCH MEAT, FAST FOODS.Marland Kitchen   DASH Diet The DASH diet stands for "Dietary Approaches to Stop Hypertension." It is a healthy eating plan that has been shown to reduce high blood pressure (hypertension) in as little as 14 days, while also possibly providing other significant health benefits. These other health benefits include reducing the risk of breast cancer after menopause and reducing the risk of type 2 diabetes, heart disease, colon cancer, and stroke. Health benefits also include weight loss and slowing kidney failure in patients with chronic kidney disease.  DIET GUIDELINES  Limit salt (sodium). Your diet should contain less than 1500 mg of sodium daily.   Limit refined or processed carbohydrates. Your diet should include mostly whole grains. Desserts and added sugars should be used sparingly.   Include small amounts of heart-healthy fats. These types of fats include nuts, oils, and tub margarine. Limit saturated and trans fats. These fats have been shown to be harmful in the body.  CHOOSING FOODS  The following food groups are based on a 2000 calorie diet. See your Registered Dietitian for individual calorie needs. Grains and Grain Products (6 to 8 servings daily)  Eat More Often: Whole-wheat bread, brown rice, whole-grain or wheat pasta, quinoa, popcorn without added fat or salt (air popped).   Eat Less Often: White bread, white pasta, white rice, cornbread.  Vegetables (4 to 5  servings daily)  Eat More Often: Fresh, frozen, and canned vegetables. Vegetables may be raw, steamed, roasted, or grilled with a minimal amount of fat.   Eat Less Often/Avoid: Creamed or fried vegetables. Vegetables in a cheese sauce.  Fruit (4 to 5 servings daily)  Eat More Often: All fresh, canned (in natural juice), or frozen fruits. Dried fruits without added sugar. One hundred percent fruit juice ( cup [237 mL] daily).   Eat Less Often: Dried fruits with added sugar. Canned fruit in light or heavy syrup.  [employer], Fish, and Poultry (2 servings or less daily. One serving is 3 to 4 oz [85-114 g]).  Eat More Often: Ninety percent or leaner ground beef, tenderloin, sirloin. Round cuts of beef, chicken breast, [place] breast. All fish. Grill, bake, or broil your meat. Nothing should be fried.   Eat Less Often/Avoid: Fatty cuts of meat, [place], or chicken leg, thigh, or wing. Fried cuts of meat or fish.  Dairy (2 to 3 servings)  Eat More Often: Low-fat or fat-free milk, low-fat plain or light yogurt, reduced-fat or part-skim cheese.   Eat Less Often/Avoid: Milk (whole, 2%, skim, or chocolate).Whole milk yogurt. Full-fat cheeses.  Nuts, Seeds, and Legumes (4 to 5 servings per week)  Eat More Often: All without added salt.   Eat Less Often/Avoid: Salted nuts and seeds, canned beans with added salt.  Fats and Sweets (limited)  Eat More Often: Vegetable oils, tub margarines without trans fats, sugar-free gelatin. Mayonnaise and salad dressings.   Eat Less Often/Avoid: Coconut oils, palm oils, butter, stick margarine, cream, half and half, cookies,  candy, pie.  FOR MORE INFORMATION The Dash Diet Eating Plan: www.dashdiet.org Document Released: 07/18/2011 Document Reviewed: 07/08/2011 [facility] Patient Information 2012 [place], [place].
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
## 2012-02-11 ENCOUNTER — Other Ambulatory Visit: Payer: Medicare Other

## 2012-03-13 ENCOUNTER — Other Ambulatory Visit (HOSPITAL_COMMUNITY): Payer: Self-pay | Admitting: Chiropractic Medicine

## 2012-03-13 DIAGNOSIS — R52 Pain, unspecified: Secondary | ICD-10-CM

## 2012-03-17 ENCOUNTER — Ambulatory Visit (HOSPITAL_COMMUNITY)
Admission: RE | Admit: 2012-03-17 | Discharge: 2012-03-17 | Disposition: A | Discharge status: Home / Self Care | Payer: Medicare Other | Source: Ambulatory Visit | Attending: Chiropractic Medicine | Admitting: Chiropractic Medicine

## 2012-03-17 DIAGNOSIS — R209 Unspecified disturbances of skin sensation: Secondary | ICD-10-CM | POA: Insufficient documentation

## 2012-03-17 DIAGNOSIS — R52 Pain, unspecified: Secondary | ICD-10-CM

## 2012-03-17 DIAGNOSIS — M542 Cervicalgia: Secondary | ICD-10-CM | POA: Insufficient documentation

## 2012-03-17 DIAGNOSIS — R29898 Other symptoms and signs involving the musculoskeletal system: Secondary | ICD-10-CM | POA: Insufficient documentation

## 2012-05-01 ENCOUNTER — Ambulatory Visit: Payer: Medicare Other | Admitting: Cardiovascular Disease

## 2012-07-22 ENCOUNTER — Ambulatory Visit (HOSPITAL_BASED_OUTPATIENT_CLINIC_OR_DEPARTMENT_OTHER): Admit: 2012-07-22 | Payer: Self-pay | Admitting: Orthopedic Surgery

## 2012-07-22 ENCOUNTER — Encounter (HOSPITAL_BASED_OUTPATIENT_CLINIC_OR_DEPARTMENT_OTHER): Payer: Self-pay

## 2012-07-22 SURGERY — CARPAL TUNNEL RELEASE
Anesthesia: General | Site: Wrist | Laterality: Right

## 2013-02-22 ENCOUNTER — Encounter: Payer: Self-pay | Admitting: Obstetrics

## 2013-02-22 ENCOUNTER — Ambulatory Visit (INDEPENDENT_AMBULATORY_CARE_PROVIDER_SITE_OTHER): Payer: Medicare Other | Admitting: Obstetrics

## 2013-02-22 VITALS — BP 136/79 | HR 68 | Ht 61.0 in | Wt 174.0 lb

## 2013-02-22 DIAGNOSIS — N760 Acute vaginitis: Secondary | ICD-10-CM

## 2013-02-22 DIAGNOSIS — N951 Menopausal and female climacteric states: Secondary | ICD-10-CM

## 2013-02-22 DIAGNOSIS — Z01419 Encounter for gynecological examination (general) (routine) without abnormal findings: Secondary | ICD-10-CM

## 2013-02-22 NOTE — Progress Notes (Signed)
Subjective:     Renee Adkins is a 67 y.o. female here for a routine exam.  Current complaints: annual exam. Personal health questionnaire reviewed: yes.   Gynecologic History No LMP recorded. Patient is postmenopausal. Contraception: none Last Pap: 2012. Results were: normal Last mammogram: 2014. Results were: normal  Obstetric History OB History   Grav Para Term Preterm Abortions TAB SAB Ect Mult Living                   The following portions of the patient's history were reviewed and updated as appropriate: allergies, current medications, past family history, past medical history, past social history, past surgical history and problem list.  Review of Systems Pertinent items are noted in HPI.    Objective:    General appearance: alert and no distress Breasts: normal appearance, no masses or tenderness Abdomen: normal findings: soft, non-tender Pelvic: adnexae not palpable, external genitalia normal, uterus surgically absent and vagina normal without discharge    Assessment:    Healthy female exam.    Plan:    Education reviewed: calcium supplements and self breast exams. Follow up in: 2 years.

## 2013-03-02 ENCOUNTER — Other Ambulatory Visit: Payer: Self-pay | Admitting: Obstetrics

## 2013-03-02 MED ORDER — FLUCONAZOLE 150 MG PO TABS: 150.0000 mg | ORAL_TABLET | Freq: Once | ORAL | Status: DC

## 2013-03-02 NOTE — Progress Notes (Signed)
Rx sent to pharmacy. Voice message left informing patient.

## 2013-11-10 ENCOUNTER — Observation Stay (HOSPITAL_COMMUNITY)
Admission: EM | Admit: 2013-11-10 | Discharge: 2013-11-12 | Disposition: A | Discharge status: Home / Self Care | Payer: Medicare Other | Attending: Internal Medicine | Admitting: Internal Medicine

## 2013-11-10 ENCOUNTER — Encounter (HOSPITAL_COMMUNITY): Payer: Self-pay | Admitting: Emergency Medicine

## 2013-11-10 ENCOUNTER — Emergency Department (HOSPITAL_COMMUNITY): Payer: Medicare Other

## 2013-11-10 DIAGNOSIS — R209 Unspecified disturbances of skin sensation: Principal | ICD-10-CM

## 2013-11-10 DIAGNOSIS — F411 Generalized anxiety disorder: Secondary | ICD-10-CM | POA: Insufficient documentation

## 2013-11-10 DIAGNOSIS — K219 Gastro-esophageal reflux disease without esophagitis: Secondary | ICD-10-CM | POA: Diagnosis present

## 2013-11-10 DIAGNOSIS — R5383 Other fatigue: Secondary | ICD-10-CM

## 2013-11-10 DIAGNOSIS — I10 Essential (primary) hypertension: Secondary | ICD-10-CM | POA: Diagnosis present

## 2013-11-10 DIAGNOSIS — R531 Weakness: Secondary | ICD-10-CM

## 2013-11-10 DIAGNOSIS — R0602 Shortness of breath: Secondary | ICD-10-CM

## 2013-11-10 DIAGNOSIS — F419 Anxiety disorder, unspecified: Secondary | ICD-10-CM | POA: Diagnosis present

## 2013-11-10 DIAGNOSIS — Z96659 Presence of unspecified artificial knee joint: Secondary | ICD-10-CM | POA: Insufficient documentation

## 2013-11-10 DIAGNOSIS — R0902 Hypoxemia: Secondary | ICD-10-CM | POA: Diagnosis present

## 2013-11-10 DIAGNOSIS — R5381 Other malaise: Secondary | ICD-10-CM | POA: Insufficient documentation

## 2013-11-10 DIAGNOSIS — I509 Heart failure, unspecified: Secondary | ICD-10-CM

## 2013-11-10 DIAGNOSIS — N179 Acute kidney failure, unspecified: Secondary | ICD-10-CM

## 2013-11-10 DIAGNOSIS — R202 Paresthesia of skin: Secondary | ICD-10-CM | POA: Diagnosis present

## 2013-11-10 DIAGNOSIS — I5032 Chronic diastolic (congestive) heart failure: Secondary | ICD-10-CM | POA: Diagnosis present

## 2013-11-10 DIAGNOSIS — R200 Anesthesia of skin: Secondary | ICD-10-CM | POA: Diagnosis present

## 2013-11-10 DIAGNOSIS — R7989 Other specified abnormal findings of blood chemistry: Secondary | ICD-10-CM

## 2013-11-10 DIAGNOSIS — E876 Hypokalemia: Secondary | ICD-10-CM

## 2013-11-10 DIAGNOSIS — N951 Menopausal and female climacteric states: Secondary | ICD-10-CM

## 2013-11-10 DIAGNOSIS — R05 Cough: Secondary | ICD-10-CM

## 2013-11-10 DIAGNOSIS — R059 Cough, unspecified: Secondary | ICD-10-CM

## 2013-11-10 DIAGNOSIS — J45909 Unspecified asthma, uncomplicated: Secondary | ICD-10-CM | POA: Diagnosis present

## 2013-11-10 DIAGNOSIS — R079 Chest pain, unspecified: Secondary | ICD-10-CM

## 2013-11-10 HISTORY — DX: Chronic diastolic (congestive) heart failure: I50.32

## 2013-11-10 HISTORY — DX: Paresthesia of skin: R20.2

## 2013-11-10 HISTORY — DX: Anxiety disorder, unspecified: F41.9

## 2013-11-10 LAB — CBC WITH DIFFERENTIAL/PLATELET
Basophils Absolute: 0 10*3/uL (ref 0.0–0.1)
Basophils Relative: 1 % (ref 0–1)
Eosinophils Absolute: 0.3 10*3/uL (ref 0.0–0.7)
Eosinophils Relative: 4 % (ref 0–5)
HCT: 38.6 % (ref 36.0–46.0)
Hemoglobin: 13.4 g/dL (ref 12.0–15.0)
LYMPHS ABS: 2.8 10*3/uL (ref 0.7–4.0)
LYMPHS PCT: 38 % (ref 12–46)
MCH: 31.5 pg (ref 26.0–34.0)
MCHC: 34.7 g/dL (ref 30.0–36.0)
MCV: 90.6 fL (ref 78.0–100.0)
Monocytes Absolute: 0.5 10*3/uL (ref 0.1–1.0)
Monocytes Relative: 6 % (ref 3–12)
NEUTROS PCT: 51 % (ref 43–77)
Neutro Abs: 3.9 10*3/uL (ref 1.7–7.7)
PLATELETS: 154 10*3/uL (ref 150–400)
RBC: 4.26 MIL/uL (ref 3.87–5.11)
RDW: 13.6 % (ref 11.5–15.5)
WBC: 7.6 10*3/uL (ref 4.0–10.5)

## 2013-11-10 LAB — BASIC METABOLIC PANEL
BUN: 14 mg/dL (ref 6–23)
CO2: 25 meq/L (ref 19–32)
Calcium: 9.3 mg/dL (ref 8.4–10.5)
Chloride: 101 mEq/L (ref 96–112)
Creatinine, Ser: 0.92 mg/dL (ref 0.50–1.10)
GFR calc Af Amer: 72 mL/min — ABNORMAL LOW (ref >90)
GFR, EST NON AFRICAN AMERICAN: 62 mL/min — AB (ref >90)
GLUCOSE: 99 mg/dL (ref 70–99)
POTASSIUM: 3.8 meq/L (ref 3.7–5.3)
SODIUM: 139 meq/L (ref 137–147)

## 2013-11-10 LAB — TROPONIN I: Troponin I: <0.3 ng/mL (ref <0.30)

## 2013-11-10 LAB — HEMOGLOBIN A1C
Hgb A1c MFr Bld: 5.6 % (ref <5.7)
MEAN PLASMA GLUCOSE: 114 mg/dL (ref <117)

## 2013-11-10 LAB — PRO B NATRIURETIC PEPTIDE: PRO B NATRI PEPTIDE: 59.4 pg/mL (ref 0–125)

## 2013-11-10 MED ORDER — IPRATROPIUM BROMIDE 0.02 % IN SOLN
0.5000 mg | Freq: Once | RESPIRATORY_TRACT | Status: AC
  Administered 2013-11-10: 0.5 mg via RESPIRATORY_TRACT
  Filled 2013-11-10: qty 2.5

## 2013-11-10 MED ORDER — ASPIRIN 81 MG PO CHEW
81.0000 mg | CHEWABLE_TABLET | Freq: Every day | ORAL | Status: DC
  Administered 2013-11-10 – 2013-11-12 (×3): 81 mg via ORAL
  Filled 2013-11-10 (×3): qty 1

## 2013-11-10 MED ORDER — GUAIFENESIN-DM 100-10 MG/5ML PO SYRP: 5.0000 mL | ORAL_SOLUTION | ORAL | Status: DC | PRN

## 2013-11-10 MED ORDER — HYDROCODONE-ACETAMINOPHEN 5-325 MG PO TABS
1.0000 | ORAL_TABLET | ORAL | Status: DC | PRN
  Administered 2013-11-10 – 2013-11-11 (×4): 2 via ORAL
  Administered 2013-11-12: 1 via ORAL
  Filled 2013-11-10 (×2): qty 2
  Filled 2013-11-10: qty 1
  Filled 2013-11-10 (×2): qty 2

## 2013-11-10 MED ORDER — ONDANSETRON HCL 4 MG/2ML IJ SOLN
4.0000 mg | Freq: Once | INTRAMUSCULAR | Status: AC
  Administered 2013-11-10: 4 mg via INTRAVENOUS
  Filled 2013-11-10: qty 2

## 2013-11-10 MED ORDER — PREDNISONE 20 MG PO TABS
60.0000 mg | ORAL_TABLET | Freq: Once | ORAL | Status: DC
  Filled 2013-11-10: qty 3

## 2013-11-10 MED ORDER — MORPHINE SULFATE 4 MG/ML IJ SOLN
4.0000 mg | Freq: Once | INTRAMUSCULAR | Status: AC
  Administered 2013-11-10: 4 mg via INTRAVENOUS
  Filled 2013-11-10: qty 1

## 2013-11-10 MED ORDER — ONDANSETRON HCL 4 MG/2ML IJ SOLN
4.0000 mg | Freq: Four times a day (QID) | INTRAMUSCULAR | Status: DC | PRN
  Administered 2013-11-11: 4 mg via INTRAVENOUS
  Filled 2013-11-10: qty 2

## 2013-11-10 MED ORDER — ARFORMOTEROL TARTRATE 15 MCG/2ML IN NEBU
15.0000 ug | INHALATION_SOLUTION | Freq: Two times a day (BID) | RESPIRATORY_TRACT | Status: DC
  Administered 2013-11-11 – 2013-11-12 (×2): 15 ug via RESPIRATORY_TRACT
  Filled 2013-11-10 (×7): qty 2

## 2013-11-10 MED ORDER — ALBUTEROL SULFATE (2.5 MG/3ML) 0.083% IN NEBU
5.0000 mg | INHALATION_SOLUTION | Freq: Once | RESPIRATORY_TRACT | Status: AC
  Administered 2013-11-10: 5 mg via RESPIRATORY_TRACT
  Filled 2013-11-10: qty 6

## 2013-11-10 MED ORDER — CITALOPRAM HYDROBROMIDE 40 MG PO TABS
40.0000 mg | ORAL_TABLET | Freq: Every day | ORAL | Status: DC
  Administered 2013-11-11 – 2013-11-12 (×2): 40 mg via ORAL
  Filled 2013-11-10 (×3): qty 1

## 2013-11-10 MED ORDER — BUDESONIDE 0.5 MG/2ML IN SUSP
0.5000 mg | Freq: Two times a day (BID) | RESPIRATORY_TRACT | Status: DC
  Administered 2013-11-11 – 2013-11-12 (×2): 0.5 mg via RESPIRATORY_TRACT
  Filled 2013-11-10 (×6): qty 2

## 2013-11-10 MED ORDER — ALPRAZOLAM 0.5 MG PO TABS
0.5000 mg | ORAL_TABLET | Freq: Every evening | ORAL | Status: DC | PRN
  Administered 2013-11-10 – 2013-11-12 (×2): 0.5 mg via ORAL
  Filled 2013-11-10 (×2): qty 1

## 2013-11-10 MED ORDER — DIAZEPAM 2 MG PO TABS
2.0000 mg | ORAL_TABLET | Freq: Three times a day (TID) | ORAL | Status: DC | PRN
  Administered 2013-11-10 – 2013-11-11 (×2): 2 mg via ORAL
  Filled 2013-11-10 (×2): qty 1

## 2013-11-10 MED ORDER — ALUM & MAG HYDROXIDE-SIMETH 200-200-20 MG/5ML PO SUSP: 30.0000 mL | Freq: Four times a day (QID) | ORAL | Status: DC | PRN

## 2013-11-10 MED ORDER — POLYETHYLENE GLYCOL 3350 17 G PO PACK
17.0000 g | PACK | Freq: Every day | ORAL | Status: DC | PRN
  Filled 2013-11-10: qty 1

## 2013-11-10 MED ORDER — ONDANSETRON HCL 4 MG PO TABS: 4.0000 mg | ORAL_TABLET | Freq: Four times a day (QID) | ORAL | Status: DC | PRN

## 2013-11-10 MED ORDER — VITAMIN C 500 MG PO TABS
500.0000 mg | ORAL_TABLET | Freq: Every day | ORAL | Status: DC
  Administered 2013-11-11 – 2013-11-12 (×2): 500 mg via ORAL
  Filled 2013-11-10 (×2): qty 1

## 2013-11-10 MED ORDER — HYDROCHLOROTHIAZIDE 25 MG PO TABS
25.0000 mg | ORAL_TABLET | ORAL | Status: DC
  Administered 2013-11-11 – 2013-11-12 (×2): 25 mg via ORAL
  Filled 2013-11-10 (×3): qty 1

## 2013-11-10 MED ORDER — POTASSIUM CHLORIDE CRYS ER 20 MEQ PO TBCR
20.0000 meq | EXTENDED_RELEASE_TABLET | Freq: Two times a day (BID) | ORAL | Status: DC
  Administered 2013-11-10 – 2013-11-12 (×4): 20 meq via ORAL
  Filled 2013-11-10 (×6): qty 1

## 2013-11-10 MED ORDER — ALBUTEROL SULFATE (2.5 MG/3ML) 0.083% IN NEBU
3.0000 mL | INHALATION_SOLUTION | Freq: Four times a day (QID) | RESPIRATORY_TRACT | Status: DC | PRN
Start: 2013-11-10 — End: 2013-11-12

## 2013-11-10 MED ORDER — NEBIVOLOL HCL 10 MG PO TABS
10.0000 mg | ORAL_TABLET | Freq: Every day | ORAL | Status: DC
  Administered 2013-11-11 – 2013-11-12 (×2): 10 mg via ORAL
  Filled 2013-11-10 (×2): qty 1

## 2013-11-10 MED ORDER — CYCLOBENZAPRINE HCL 10 MG PO TABS: 10.0000 mg | ORAL_TABLET | Freq: Three times a day (TID) | ORAL | Status: DC | PRN

## 2013-11-10 MED ORDER — HEPARIN SODIUM (PORCINE) 5000 UNIT/ML IJ SOLN
5000.0000 [IU] | Freq: Three times a day (TID) | INTRAMUSCULAR | Status: DC
  Administered 2013-11-10 – 2013-11-12 (×5): 5000 [IU] via SUBCUTANEOUS
  Filled 2013-11-10 (×6): qty 1

## 2013-11-10 NOTE — ED Provider Notes (Signed)
Medical screening examination/treatment/procedure(s) were conducted as a shared visit with non-physician practitioner(s) and myself.  I personally evaluated the patient during the encounter.   EKG Interpretation   Date/Time:  Wednesday November 10 2013 09:35:00 EDT Ventricular Rate:  74 PR Interval:  150 QRS Duration: 64 QT Interval:  406 QTC Calculation: 450 R Axis:   85 Text Interpretation:  Normal sinus rhythm Normal ECG No significant change  since last tracing Confirmed by Garvis Downum  MD, Vessie Olmsted [phone]([id number]) on 11/10/2013  2:58:17 PM      Results for orders placed during the hospital encounter of 11/10/13  CBC WITH DIFFERENTIAL      Result Value Ref Range   WBC 7.6  4.0 - 10.5 K/uL   RBC 4.26  3.87 - 5.11 MIL/uL   Hemoglobin 13.4  12.0 - 15.0 g/dL   HCT 40.938.6  81.136.0 - 91.446.0 %   MCV 90.6  78.0 - 100.0 fL   MCH 31.5  26.0 - 34.0 pg   MCHC 34.7  30.0 - 36.0 g/dL   RDW 78.213.6  95.611.5 - 21.315.5 %   Platelets 154  150 - 400 K/uL   Neutrophils Relative % 51  43 - 77 %   Neutro Abs 3.9  1.7 - 7.7 K/uL   Lymphocytes Relative 38  12 - 46 %   Lymphs Abs 2.8  0.7 - 4.0 K/uL   Monocytes Relative 6  3 - 12 %   Monocytes Absolute 0.5  0.1 - 1.0 K/uL   Eosinophils Relative 4  0 - 5 %   Eosinophils Absolute 0.3  0.0 - 0.7 K/uL   Basophils Relative 1  0 - 1 %   Basophils Absolute 0.0  0.0 - 0.1 K/uL  BASIC METABOLIC PANEL      Result Value Ref Range   Sodium 139  137 - 147 mEq/L   Potassium 3.8  3.7 - 5.3 mEq/L   Chloride 101  96 - 112 mEq/L   CO2 25  19 - 32 mEq/L   Glucose, Bld 99  70 - 99 mg/dL   BUN 14  6 - 23 mg/dL   Creatinine, Ser 0.860.92  0.50 - 1.10 mg/dL   Calcium 9.3  8.4 - 57.810.5 mg/dL   GFR calc non Af Amer 62 (*) >90 mL/min   GFR calc Af Amer 72 (*) >90 mL/min  TROPONIN I      Result Value Ref Range   Troponin I <0.30  <0.30 ng/mL   Dg Chest 2 View  11/10/2013   CLINICAL DATA:  Chest pain, asthma  EXAM: CHEST  2 VIEW  COMPARISON:  01/25/2012  FINDINGS: The heart size and mediastinal  contours are within normal limits. Both lungs are clear. The visualized skeletal structures are unremarkable.  IMPRESSION: No active cardiopulmonary disease.   Electronically Signed   By: Alcide CleverMark  Lukens M.D.   On: 11/10/2013 11:11   Ct Head Wo Contrast  11/10/2013   CLINICAL DATA:  Right arm and leg weakness.  EXAM: CT HEAD WITHOUT CONTRAST  TECHNIQUE: Contiguous axial images were obtained from the base of the skull through the vertex without intravenous contrast.  COMPARISON:  Head CT 11/01/2009.  FINDINGS: The brain has a normal appearance without evidence of malformation, atrophy, old or acute infarction, mass lesion, hemorrhage, hydrocephalus or extra-axial collection. The calvarium appears normal. Visualized sinuses, middle ears and mastoids are clear except for chronic appearing mucosal thickening of the maxillary sinuses.  IMPRESSION: Normal head CT.  Chronic inflammatory changes  of the maxillary sinuses.   Electronically Signed   By: Paulina Fusi M.D.   On: 11/10/2013 10:44   Mr Brain Wo Contrast  11/10/2013   CLINICAL DATA:  Right-sided numbness and weakness  EXAM: MRI HEAD WITHOUT CONTRAST  TECHNIQUE: Multiplanar, multiecho pulse sequences of the brain and surrounding structures were obtained without intravenous contrast.  COMPARISON:  CT 11/10/2013, MRI 07/18/2009  FINDINGS: Ventricle size is normal. Cerebral volume is normal. Craniocervical junction is normal. Pituitary is normal in size.  Negative for acute infarct. Several small white matter hyperintensities bilaterally, new since 2010 and most consistent with mild chronic microvascular ischemia. Brainstem and cerebellum are normal.  Negative for intracranial hemorrhage.  No mass or fluid collection.  Mucosal edema in the paranasal sinuses.  IMPRESSION: No acute abnormality.  Within normal limits for age.   Electronically Signed   By: Marlan Palau M.D.   On: 11/10/2013 14:10    Patient initially presented with a complaint concern of numbness.  Extensive workup for that without evidence of acute stroke to include head CT and MRI. Feeding rate to discharge the patient and she had some D. desaturation while ambulating. Pulse ox went down to just 89%. Patient has a history of asthma and is on nebulizer and albuterol inhaler and other meds at home. Will give a nebulizer treatment here and then re\re ambulate her. If she does not desaturate can be discharged home. Patient currently in no acute distress nontoxic at rest no shortness of breath. She states that she's had some shortness of breath on and off all day but mostly it was because she was concerned about the cause of the numbness. No other focal neuro deficits.  Shelda Jakes, MD 11/10/13 [phone]

## 2013-11-10 NOTE — H&P (Signed)
Patient Demographics  Renee Adkins, is a 68 y.o. female  MRN: [id number]   DOB - 09/20/1945  Admit Date - 11/10/2013  Outpatient Primary MD for the patient is [name], MD   With History of -  Past Medical History  Diagnosis Date  . Hypertension   . Asthma   . Paresthesia 01/27/2012  . Chronic diastolic congestive heart failure 01/29/2012  . Anxiety 01/25/2012      Past Surgical History  Procedure Laterality Date  . Vesicovaginal fistula closure w/ tah    . Total knee arthroplasty  Jan 2011    rt knee    in for   Chief Complaint  Patient presents with  . Numbness     HPI  Renee Adkins  is a 68 y.o. female, with history of anxiety, asthma, chronically elevated d-dimer, chronic diastolic CHF, paresthesias who is in her usual state of health until yesterday evening when she started to experience some right-sided tingling and numbness which starts in her chest, goes into her right arm and sometimes into her right leg, she also describes some comparative weakness on the right side as compared to the left, no facial droop or tingling, no headache, no problems swallowing food or liquids, no chest pain or pressure, no palpitations, no cough shortness of breath, no abdominal pain, no diarrhea no weakness.   In the ER her workup was unremarkable, head CT and MRI stable, EKG lab work stable, chest x-ray stable. On ambition she was noted to be mildly hypoxic but she does not experience any shortness of breath, I was called to admit the patient for right-sided tingling numbness and some exertional hypoxia without the sensation of shortness of breath.    Review of Systems    In addition to the HPI above,   No Fever-chills, No Headache, No changes with Vision or hearing, No problems swallowing food or  Liquids, No Chest pain, Cough or Shortness of Breath, No Abdominal pain, No Nausea or Vommitting, Bowel movements are regular, No Blood in stool or Urine, No dysuria, No new skin rashes or bruises, No new joints pains-aches,  No new weakness, tingling, numbness in any extremity, except right-sided symptoms as above No recent weight gain or loss, No polyuria, polydypsia or polyphagia, No significant Mental Stressors.  A full 10 point Review of Systems was done, except as stated above, all other Review of Systems were negative.   Social History History  Substance Use Topics  . Smoking status: Never Smoker   . Smokeless tobacco: Never Used  . Alcohol Use: Yes     Comment: Socially       Family History Family History  Problem Relation Age of Onset  . Asthma Neg Hx   . Allergies Neg Hx   . Heart attack Father 40      Prior to Admission medications   Medication Sig Start Date End Date Taking? Authorizing Provider  albuterol (PROVENTIL HFA;VENTOLIN HFA) 108 (  90 BASE) MCG/ACT inhaler Inhale 2 puffs into the lungs every 6 (six) hours as needed for wheezing or shortness of breath.    Yes Historical Provider, MD  albuterol (PROVENTIL) (2.5 MG/3ML) 0.083% nebulizer solution Inhale 2.5 mg into the lungs every 6 (six) hours as needed for wheezing.   Yes Historical Provider, MD  ALPRAZolam Prudy Feeler) 0.5 MG tablet Take 0.5 mg by mouth at bedtime as needed for sleep.   Yes Historical Provider, MD  arformoterol (BROVANA) 15 MCG/2ML NEBU Take 15 mcg by nebulization 2 (two) times daily.    Yes Historical Provider, MD  budesonide (PULMICORT) 0.5 MG/2ML nebulizer solution Take 0.5 mg by nebulization 2 (two) times daily.    Yes Historical Provider, MD  Cholecalciferol ([employer] VIT D 5000 HIGH-POTENCY PO) Take 5,000 Units by mouth daily.   Yes Historical Provider, MD  citalopram (CELEXA) 40 MG tablet Take 40 mg by mouth daily.   Yes Historical Provider, MD  cyclobenzaprine (FLEXERIL) 10 MG tablet Take  10 mg by mouth 3 (three) times daily as needed. For muscle spasms   Yes Historical Provider, MD  diazepam (VALIUM) 5 MG tablet Take 2.5-5 mg by mouth daily as needed. To relax   Yes Historical Provider, MD  Eszopiclone (ESZOPICLONE) 3 MG TABS Take 3 mg by mouth at bedtime as needed. For sleep  --Take immediately before bedtime   Yes Historical Provider, MD  hydrochlorothiazide (HYDRODIURIL) 25 MG tablet Take 25 mg by mouth every morning.   Yes Historical Provider, MD  nebivolol (BYSTOLIC) 10 MG tablet Take 10 mg by mouth daily.     Yes Historical Provider, MD  potassium chloride SA (K-DUR,KLOR-CON) 20 MEQ tablet Take 1 tablet (20 mEq total) by mouth 2 (two) times daily. 01/29/12  Yes Vesta Mixer, MD  vitamin C (ASCORBIC ACID) 500 MG tablet Take 500 mg by mouth daily.   Yes Historical Provider, MD    Allergies  Allergen Reactions  . Azithromycin Shortness Of Breath and Swelling  . Meloxicam Shortness Of Breath  . Codeine     REACTION: tachycardia    Physical Exam  Vitals  Blood pressure 113/55, pulse 66, temperature 97.5 F (36.4 C), temperature source Oral, resp. rate 18, height 5\' 1"  (1.549 m), weight 75.014 kg (165 lb 6 oz), SpO2 92.00%.   1. General anxious middle aged AA female lying in bed in NAD,     2. Normal affect and insight, Not Suicidal or Homicidal, Awake Alert, Oriented X 3.  3. No F.N deficits, ALL C.Nerves Intact, Strength 5/5 all 4 extremities but some variable R.sided comparative weakness , Sensation intact all 4 extremities, Plantars down going.  4. Ears and Eyes appear Normal, Conjunctivae clear, PERRLA. Moist Oral Mucosa.  5. Supple Neck, No JVD, No cervical lymphadenopathy appriciated, No Carotid Bruits.  6. Symmetrical Chest wall movement, Good air movement bilaterally, CTAB.  7. RRR, No Gallops, Rubs or Murmurs, No Parasternal Heave.  8. Positive Bowel Sounds, Abdomen Soft, Non tender, No organomegaly appriciated,No rebound -guarding or  rigidity.  9.  No Cyanosis, Normal Skin Turgor, No Skin Rash or Bruise.  10. Good muscle tone,  joints appear normal , no effusions, Normal ROM.  11. No Palpable Lymph Nodes in Neck or Axillae    Data Review  CBC  Recent Labs Lab 11/10/13 1040  WBC 7.6  HGB 13.4  HCT 38.6  PLT 154  MCV 90.6  MCH 31.5  MCHC 34.7  RDW 13.6  LYMPHSABS 2.8  MONOABS 0.5  EOSABS 0.3  BASOSABS 0.0   ------------------------------------------------------------------------------------------------------------------  Chemistries   Recent Labs Lab 11/10/13 1040  NA 139  K 3.8  CL 101  CO2 25  GLUCOSE 99  BUN 14  CREATININE 0.92  CALCIUM 9.3   ------------------------------------------------------------------------------------------------------------------ estimated creatinine clearance is 53.5 ml/min (by C-G formula based on Cr of 0.92). ------------------------------------------------------------------------------------------------------------------ No results found for this basename: TSH, T4TOTAL, FREET3, T3FREE, THYROIDAB,  in the last 72 hours   Coagulation profile No results found for this basename: INR, PROTIME,  in the last 168 hours ------------------------------------------------------------------------------------------------------------------- No results found for this basename: DDIMER,  in the last 72 hours -------------------------------------------------------------------------------------------------------------------  Cardiac Enzymes  Recent Labs Lab 11/10/13 1040  TROPONINI <0.30   ------------------------------------------------------------------------------------------------------------------ No components found with this basename: POCBNP,    ---------------------------------------------------------------------------------------------------------------  Urinalysis    Component Value Date/Time   COLORURINE YELLOW 02/18/2009 1040   APPEARANCEUR CLOUDY*  02/18/2009 1040   LABSPEC 1.013 02/18/2009 1040   PHURINE 7.0 02/18/2009 1040   GLUCOSEU NEGATIVE 02/18/2009 1040   HGBUR NEGATIVE 02/18/2009 1040   BILIRUBINUR NEGATIVE 02/18/2009 1040   KETONESUR NEGATIVE 02/18/2009 1040   PROTEINUR NEGATIVE 02/18/2009 1040   UROBILINOGEN 0.2 02/18/2009 1040   NITRITE NEGATIVE 02/18/2009 1040   LEUKOCYTESUR NEGATIVE MICROSCOPIC NOT DONE ON URINES WITH NEGATIVE PROTEIN, BLOOD, LEUKOCYTES, NITRITE, OR GLUCOSE <1000 mg/dL. 02/18/2009 1040    ----------------------------------------------------------------------------------------------------------------  Imaging results:   Dg Chest 2 View  11/10/2013   CLINICAL DATA:  Chest pain, asthma  EXAM: CHEST  2 VIEW  COMPARISON:  01/25/2012  FINDINGS: The heart size and mediastinal contours are within normal limits. Both lungs are clear. The visualized skeletal structures are unremarkable.  IMPRESSION: No active cardiopulmonary disease.   Electronically Signed   By: Alcide Clever M.D.   On: 11/10/2013 11:11   Ct Head Wo Contrast  11/10/2013   CLINICAL DATA:  Right arm and leg weakness.  EXAM: CT HEAD WITHOUT CONTRAST  TECHNIQUE: Contiguous axial images were obtained from the base of the skull through the vertex without intravenous contrast.  COMPARISON:  Head CT 11/01/2009.  FINDINGS: The brain has a normal appearance without evidence of malformation, atrophy, old or acute infarction, mass lesion, hemorrhage, hydrocephalus or extra-axial collection. The calvarium appears normal. Visualized sinuses, middle ears and mastoids are clear except for chronic appearing mucosal thickening of the maxillary sinuses.  IMPRESSION: Normal head CT.  Chronic inflammatory changes of the maxillary sinuses.   Electronically Signed   By: Paulina Fusi M.D.   On: 11/10/2013 10:44   Mr Brain Wo Contrast  11/10/2013   CLINICAL DATA:  Right-sided numbness and weakness  EXAM: MRI HEAD WITHOUT CONTRAST  TECHNIQUE: Multiplanar, multiecho pulse sequences of the  brain and surrounding structures were obtained without intravenous contrast.  COMPARISON:  CT 11/10/2013, MRI 07/18/2009  FINDINGS: Ventricle size is normal. Cerebral volume is normal. Craniocervical junction is normal. Pituitary is normal in size.  Negative for acute infarct. Several small white matter hyperintensities bilaterally, new since 2010 and most consistent with mild chronic microvascular ischemia. Brainstem and cerebellum are normal.  Negative for intracranial hemorrhage.  No mass or fluid collection.  Mucosal edema in the paranasal sinuses.  IMPRESSION: No acute abnormality.  Within normal limits for age.   Electronically Signed   By: Marlan Palau M.D.   On: 11/10/2013 14:10    My personal review of EKG: Rhythm NSR, , no Acute ST changes    Assessment & Plan   1. Right-sided tingling numbness. Unclear etiology.  Head CT and MRI are stable, will admit her to rule out neck pathology, we'll check an MRI, for now place her on aspirin, neurochecks, PT OT and speech eval, check lipid panel and statin. Neuro to evaluate. Patient does appear to be anxious question if anxiety is playing some role.     2. Mild exertional hypoxia. Cause unclear, she has history of asthma but no wheezing on exam, no rales, chest x-ray stable, lab work is unremarkable, will check echogram, cycle troponin, pro BNP, EKG stable. For now aspirin to be continued along with as needed nebulizer treatments and oxygen.     3. Chronic diastolic CHF EF 60% in 2013. Clinically appears compensated, checking pro BNP and repeating echogram along with cycling troponins.     4. Hypertension. Continue home medications which include HCTZ, Bystolic.     5. History of anxiety. Continue Celexa along with Valium and as needed Xanax.      DVT Prophylaxis Heparin    AM Labs Ordered, also please review Full Orders  Family Communication: Admission, patients condition and plan of care including tests being ordered have  been discussed with the patient and husband who indicate understanding and agree with the plan and Code Status.  Code Status Full  Likely DC to  Home  Condition GUARDED    Time spent in minutes :35    SINGH,PRASHANT K M.D on 11/10/2013 at 4:45 PM  Between 7am to 7pm - Pager - [phone]  After 7pm go to [email or web address] - password [email or web address]  And look for the night coverage person covering me after hours  [facility] Office  [phone]

## 2013-11-10 NOTE — ED Notes (Addendum)
Pt states she began having R arm, hand, and back numbness last night.  Pt states R arm and hand numbness was present last night and she went to bed.  Pt states worsening numbness this morning and now she has R sided back numbness.  No drift.  R hand grip slightly weaker than L.

## 2013-11-10 NOTE — ED Notes (Signed)
Patient's O2 saturation rapidly dropped from 97% to 89% and remained at 89% for the duration of ambulating down the hallway

## 2013-11-10 NOTE — ED Provider Notes (Signed)
CSN: [id number]     Arrival date & time 11/10/13  4098 History   First MD Initiated Contact with Patient 11/10/13 [phone]     Chief Complaint  Patient presents with  . Numbness     (Consider location/radiation/quality/duration/timing/severity/associated sxs/prior Treatment) HPI Comments: Pt state that she started having pain in her back and right arm last night and developed right hand numbness at the time. Pt states that the symptoms seemed more pronounced this morning. Pt states that she also feels week in her right leg. Denies cp. Pt states that she has had some sob. She states that she has not had any problems with ambulation or walking but she she feels numb in area. Denies speech problems or confusion. Pt denies anything making the back pain better or worse.  The history is provided by the patient. No language interpreter was used.    Past Medical History  Diagnosis Date  . Hypertension   . Asthma    Past Surgical History  Procedure Laterality Date  . Vesicovaginal fistula closure w/ tah    . Total knee arthroplasty  Jan 2011    rt knee   Family History  Problem Relation Age of Onset  . Asthma Neg Hx   . Allergies Neg Hx   . Heart attack Father 67   History  Substance Use Topics  . Smoking status: Never Smoker   . Smokeless tobacco: Never Used  . Alcohol Use: Yes     Comment: Socially    OB History   Grav Para Term Preterm Abortions TAB SAB Ect Mult Living                 Review of Systems  Constitutional: Negative.   Eyes: Negative for visual disturbance.  Respiratory: Negative.   Cardiovascular: Negative.       Allergies  Azithromycin; Meloxicam; and Codeine  Home Medications   Current Outpatient Rx  Name  Route  Sig  Dispense  Refill  . albuterol (PROVENTIL HFA;VENTOLIN HFA) 108 (90 BASE) MCG/ACT inhaler   Inhalation   Inhale 2 puffs into the lungs every 6 (six) hours as needed for wheezing or shortness of breath.          Marland Kitchen albuterol (PROVENTIL)  (2.5 MG/3ML) 0.083% nebulizer solution   Inhalation   Inhale 2.5 mg into the lungs every 6 (six) hours as needed for wheezing.         Marland Kitchen ALPRAZolam (XANAX) 0.5 MG tablet   Oral   Take 0.5 mg by mouth at bedtime as needed for sleep.         Marland Kitchen arformoterol (BROVANA) 15 MCG/2ML NEBU   Nebulization   Take 15 mcg by nebulization 2 (two) times daily.          . budesonide (PULMICORT) 0.5 MG/2ML nebulizer solution   Nebulization   Take 0.5 mg by nebulization 2 (two) times daily.          . Cholecalciferol ([employer] VIT D 5000 HIGH-POTENCY PO)   Oral   Take 5,000 Units by mouth daily.         . citalopram (CELEXA) 40 MG tablet   Oral   Take 40 mg by mouth daily.         . cyclobenzaprine (FLEXERIL) 10 MG tablet   Oral   Take 10 mg by mouth 3 (three) times daily as needed. For muscle spasms         . diazepam (VALIUM) 5 MG tablet  Oral   Take 2.5-5 mg by mouth daily as needed. To relax         . Eszopiclone (ESZOPICLONE) 3 MG TABS   Oral   Take 3 mg by mouth at bedtime as needed. For sleep  --Take immediately before bedtime         . hydrochlorothiazide (HYDRODIURIL) 25 MG tablet   Oral   Take 25 mg by mouth every morning.         . nebivolol (BYSTOLIC) 10 MG tablet   Oral   Take 10 mg by mouth daily.           . potassium chloride SA (K-DUR,KLOR-CON) 20 MEQ tablet   Oral   Take 1 tablet (20 mEq total) by mouth 2 (two) times daily.   60 tablet   5     INCREASED DOSE   . vitamin C (ASCORBIC ACID) 500 MG tablet   Oral   Take 500 mg by mouth daily.          BP 140/64  Pulse 71  Temp(Src) 97.5 F (36.4 C) (Oral)  Resp 20  Ht 5\' 1"  (1.549 m)  Wt 165 lb 6 oz (75.014 kg)  BMI 31.26 kg/m2  SpO2 98% Physical Exam  Nursing note and vitals reviewed. Constitutional: She is oriented to person, place, and time. She appears well-developed and well-nourished.  HENT:  Head: Normocephalic and atraumatic.  Cardiovascular: Normal rate and regular rhythm.    Musculoskeletal: Normal range of motion.  Neurological: She is alert and oriented to person, place, and time.  Right arm grip strength is weaker then left  Skin: Skin is warm and dry.  Psychiatric: She has a normal mood and affect.    ED Course  Procedures (including critical care time) Labs Review Labs Reviewed  BASIC METABOLIC PANEL - Abnormal; Notable for the following:    GFR calc non Af Amer 62 (*)    GFR calc Af Amer 72 (*)    All other components within normal limits  CBC WITH DIFFERENTIAL  TROPONIN I   Imaging Review Dg Chest 2 View  11/10/2013   CLINICAL DATA:  Chest pain, asthma  EXAM: CHEST  2 VIEW  COMPARISON:  01/25/2012  FINDINGS: The heart size and mediastinal contours are within normal limits. Both lungs are clear. The visualized skeletal structures are unremarkable.  IMPRESSION: No active cardiopulmonary disease.   Electronically Signed   By: Alcide CleverMark  Lukens M.D.   On: 11/10/2013 11:11   Ct Head Wo Contrast  11/10/2013   CLINICAL DATA:  Right arm and leg weakness.  EXAM: CT HEAD WITHOUT CONTRAST  TECHNIQUE: Contiguous axial images were obtained from the base of the skull through the vertex without intravenous contrast.  COMPARISON:  Head CT 11/01/2009.  FINDINGS: The brain has a normal appearance without evidence of malformation, atrophy, old or acute infarction, mass lesion, hemorrhage, hydrocephalus or extra-axial collection. The calvarium appears normal. Visualized sinuses, middle ears and mastoids are clear except for chronic appearing mucosal thickening of the maxillary sinuses.  IMPRESSION: Normal head CT.  Chronic inflammatory changes of the maxillary sinuses.   Electronically Signed   By: Paulina FusiMark  Shogry M.D.   On: 11/10/2013 10:44   Mr Brain Wo Contrast  11/10/2013   CLINICAL DATA:  Right-sided numbness and weakness  EXAM: MRI HEAD WITHOUT CONTRAST  TECHNIQUE: Multiplanar, multiecho pulse sequences of the brain and surrounding structures were obtained without intravenous  contrast.  COMPARISON:  CT 11/10/2013, MRI 07/18/2009  FINDINGS:  Ventricle size is normal. Cerebral volume is normal. Craniocervical junction is normal. Pituitary is normal in size.  Negative for acute infarct. Several small white matter hyperintensities bilaterally, new since 2010 and most consistent with mild chronic microvascular ischemia. Brainstem and cerebellum are normal.  Negative for intracranial hemorrhage.  No mass or fluid collection.  Mucosal edema in the paranasal sinuses.  IMPRESSION: No acute abnormality.  Within normal limits for age.   Electronically Signed   By: Marlan Palau M.D.   On: 11/10/2013 14:10     EKG Interpretation   Date/Time:  Wednesday November 10 2013 09:35:00 EDT Ventricular Rate:  74 PR Interval:  150 QRS Duration: 64 QT Interval:  406 QTC Calculation: 450 R Axis:   85 Text Interpretation:  Normal sinus rhythm Normal ECG No significant change  since last tracing Confirmed by ZACKOWSKI  MD, SCOTT ([id number]) on 11/10/2013  2:58:17 PM      MDM   Final diagnoses:  Numbness  SOB (shortness of breath)  Weakness  Hypoxia    Pt hypoxic to mid 80's with ambulation. Will admit. Lungs clear. X-ray clear. [facility] to admit. Neurology consulted    Teressa Lower, NP 11/10/13 1625

## 2013-11-10 NOTE — Consult Note (Signed)
Referring Physician: Dr. Thedore MinsSingh    Chief Complaint: Numbness involving right side.  HPI: Renee Adkins is an 68 y.o. female history of hypertension and asthma who presented with complaint of numbness involving her right upper extremity and subsequently spreading to involve her back as well as right lower extremity and right side of her face. CT scan of her head as well as noncontrast MRI showed no acute intracranial abnormality. There is no previous history of stroke nor TIA. Patient has been taking aspirin 81 mg per day. NIH stroke score was 1 for numbness.  LSN: 9 PM on 11/09/2013 tPA Given: No: No clear objective deficit; beyond time window for treatment consideration  MRankin: 1  Past Medical History  Diagnosis Date  . Hypertension   . Asthma   . Paresthesia 01/27/2012  . Chronic diastolic congestive heart failure 01/29/2012  . Anxiety 01/25/2012    Family History  Problem Relation Age of Onset  . Asthma Neg Hx   . Allergies Neg Hx   . Heart attack Father [id number]     Medications: I have reviewed the patient's current medications.  ROS: History obtained from the patient  General ROS: negative for - chills, fatigue, fever, night sweats, weight gain or weight loss Psychological ROS: negative for - behavioral disorder, hallucinations, memory difficulties, mood swings or suicidal ideation Ophthalmic ROS: negative for - blurry vision, double vision, eye pain or loss of vision ENT ROS: negative for - epistaxis, nasal discharge, oral lesions, sore throat, tinnitus or vertigo Allergy and Immunology ROS: negative for - hives or itchy/watery eyes Hematological and Lymphatic ROS: negative for - bleeding problems, bruising or swollen lymph nodes Endocrine ROS: negative for - galactorrhea, hair pattern changes, polydipsia/polyuria or temperature intolerance Respiratory ROS: negative for - cough, hemoptysis, shortness of breath or wheezing Cardiovascular ROS: negative for - chest pain, dyspnea  on exertion, edema or irregular heartbeat Gastrointestinal ROS: negative for - abdominal pain, diarrhea, hematemesis, nausea/vomiting or stool incontinence Genito-Urinary ROS: negative for - dysuria, hematuria, incontinence or urinary frequency/urgency Musculoskeletal ROS: negative for - joint swelling or muscular weakness Neurological ROS: as noted in HPI Dermatological ROS: negative for rash and skin lesion changes  Physical Examination: Blood pressure 113/55, pulse 66, temperature 97.5 F (36.4 C), temperature source Oral, resp. rate 18, height 5\' 1"  (1.549 m), weight 75.014 kg (165 lb 6 oz), SpO2 92.00%.  Neurologic Examination: Mental Status: Alert, oriented, thought content appropriate.  Speech fluent without evidence of aphasia. Able to follow commands without difficulty. Cranial Nerves: II-Visual fields were normal. III/IV/VI-Pupils were equal and reacted. Extraocular movements were full and conjugate.    V/VII-reduced perception of tactile sensation over right side of the face compared to left; splitting of vibration at midline frontal region; no facial weakness. VIII-normal. X-normal speech and symmetrical palatal movement. XII-midline tongue extension Motor: Poor effort with right hand grip compared to left hand grip; no drift of right upper nor right lower extremities; normal muscle tone throughout Sensory: Reduced perception of tactile sensation over right extremities compared to left extremities. Deep Tendon Reflexes: 1+ and symmetric except for absent right knee jerk. Plantars: Mute bilaterally Cerebellar: Normal finger-to-nose testing. Carotid auscultation: Normal  Dg Chest 2 View  11/10/2013   CLINICAL DATA:  Chest pain, asthma  EXAM: CHEST  2 VIEW  COMPARISON:  01/25/2012  FINDINGS: The heart size and mediastinal contours are within normal limits. Both lungs are clear. The visualized skeletal structures are unremarkable.  IMPRESSION: No active cardiopulmonary disease.  Electronically Signed   By: Alcide Clever M.D.   On: 11/10/2013 11:11   Ct Head Wo Contrast  11/10/2013   CLINICAL DATA:  Right arm and leg weakness.  EXAM: CT HEAD WITHOUT CONTRAST  TECHNIQUE: Contiguous axial images were obtained from the base of the skull through the vertex without intravenous contrast.  COMPARISON:  Head CT 11/01/2009.  FINDINGS: The brain has a normal appearance without evidence of malformation, atrophy, old or acute infarction, mass lesion, hemorrhage, hydrocephalus or extra-axial collection. The calvarium appears normal. Visualized sinuses, middle ears and mastoids are clear except for chronic appearing mucosal thickening of the maxillary sinuses.  IMPRESSION: Normal head CT.  Chronic inflammatory changes of the maxillary sinuses.   Electronically Signed   By: Paulina Fusi M.D.   On: 11/10/2013 10:44   Mr Brain Wo Contrast  11/10/2013   CLINICAL DATA:  Right-sided numbness and weakness  EXAM: MRI HEAD WITHOUT CONTRAST  TECHNIQUE: Multiplanar, multiecho pulse sequences of the brain and surrounding structures were obtained without intravenous contrast.  COMPARISON:  CT 11/10/2013, MRI 07/18/2009  FINDINGS: Ventricle size is normal. Cerebral volume is normal. Craniocervical junction is normal. Pituitary is normal in size.  Negative for acute infarct. Several small white matter hyperintensities bilaterally, new since 2010 and most consistent with mild chronic microvascular ischemia. Brainstem and cerebellum are normal.  Negative for intracranial hemorrhage.  No mass or fluid collection.  Mucosal edema in the paranasal sinuses.  IMPRESSION: No acute abnormality.  Within normal limits for age.   Electronically Signed   By: Marlan Palau M.D.   On: 11/10/2013 14:10    Assessment: 68 y.o. female with a history of hypertension presenting with left-sided numbness of unclear etiology. CT and MRI of the brain was unremarkable. TIA is somewhat unlikely but cannot be ruled out completely. There is  significant psychophysiologic factors contributing to patient's symptomatology, admittedly.  Stroke Risk Factors - hypertension  Plan: 1. HgbA1c, fasting lipid panel 2. PT consult, OT consult 3. Echocardiogram 4. Carotid dopplers 5. Prophylactic therapy-Antiplatelet med: Aspirin 81 mg per day 6. MRA of the brain without contrast 7. Telemetry monitoring   C.R. Roseanne Reno, MD [facility] [phone]  11/10/2013, 5:01 PM

## 2013-11-10 NOTE — Progress Notes (Signed)
Pt. C/o 10/10 squeezing/pressure to mid chest. O2 and pain medication administered. EKG completed. On call MD notified via text page. RN will continue to monitor pt. For changes in condition. Shaurya Rawdon, Cheryll DessertKaren Cherrell

## 2013-11-10 NOTE — ED Notes (Signed)
Attempted report 

## 2013-11-10 NOTE — ED Notes (Signed)
Patient transported to MRI 

## 2013-11-11 ENCOUNTER — Observation Stay (HOSPITAL_COMMUNITY): Payer: Medicare Other

## 2013-11-11 DIAGNOSIS — I519 Heart disease, unspecified: Secondary | ICD-10-CM

## 2013-11-11 LAB — BASIC METABOLIC PANEL
BUN: 15 mg/dL (ref 6–23)
CHLORIDE: 97 meq/L (ref 96–112)
CO2: 26 mEq/L (ref 19–32)
CREATININE: 0.97 mg/dL (ref 0.50–1.10)
Calcium: 9.3 mg/dL (ref 8.4–10.5)
GFR calc Af Amer: 68 mL/min — ABNORMAL LOW (ref >90)
GFR calc non Af Amer: 58 mL/min — ABNORMAL LOW (ref >90)
Glucose, Bld: 92 mg/dL (ref 70–99)
Potassium: 3.8 mEq/L (ref 3.7–5.3)
Sodium: 137 mEq/L (ref 137–147)

## 2013-11-11 LAB — LIPID PANEL
CHOL/HDL RATIO: 2.8 ratio
CHOLESTEROL: 167 mg/dL (ref 0–200)
HDL: 59 mg/dL (ref >39)
LDL Cholesterol: 84 mg/dL (ref 0–99)
TRIGLYCERIDES: 121 mg/dL (ref <150)
VLDL: 24 mg/dL (ref 0–40)

## 2013-11-11 LAB — CBC
HEMATOCRIT: 40.8 % (ref 36.0–46.0)
Hemoglobin: 13.8 g/dL (ref 12.0–15.0)
MCH: 31.4 pg (ref 26.0–34.0)
MCHC: 33.8 g/dL (ref 30.0–36.0)
MCV: 92.7 fL (ref 78.0–100.0)
Platelets: 155 10*3/uL (ref 150–400)
RBC: 4.4 MIL/uL (ref 3.87–5.11)
RDW: 13.8 % (ref 11.5–15.5)
WBC: 8 10*3/uL (ref 4.0–10.5)

## 2013-11-11 LAB — TROPONIN I: Troponin I: <0.3 ng/mL (ref <0.30)

## 2013-11-11 NOTE — Progress Notes (Signed)
Patient Demographics  Renee Adkins, is a 68 y.o. female, DOB - 02-Oct-1945, VZD:[id number]  Admit date - 11/10/2013   Admitting Physician Leroy Sea, MD  Outpatient Primary MD for the patient is [name], MD  LOS - 1   Chief Complaint  Patient presents with  . Numbness        Assessment & Plan    1. Right-sided tingling numbness. Unclear etiology. Head CT, head MRI, carotid US  are stable, says no symptoms have completely resolved, doubt this was TIA could be anxiety related, for now continue on aspirin, continue neurochecks, PT OT and speech eval, MRI C-spine along with echo, lipid panel  are pending. Neuro following.     No results found for this basename: CHOL, HDL, LDLCALC, LDLDIRECT, TRIG, CHOLHDL    Lab Results  Component Value Date   HGBA1C 5.6 11/10/2013      2. Mild exertional hypoxia. Completely resolved, no oxygen need, no chest pain or shortness of breath, no tachycardia, negative troponins along with BNP, no wheezing on exam, no rales, chest x-ray stable, EKG stable, lab work is unremarkable, follow echogram. Essentially completely resolved.     3. Chronic diastolic CHF EF 60% in 2013. Clinically appears compensated, stable pro BNP and serial troponins, pending echogram.      4. Hypertension. Continue home medications which include HCTZ, Bystolic.      5. History of anxiety. Continue Celexa along with Valium and as needed Xanax.      Code Status: Full  Family Communication: Husband yesterday  Disposition Plan: Home   Procedures - CT head, MRI brain, pending MRI C-spine, pending echogram and carotid duplex   Consults  Neuro   Medications  Scheduled Meds: . arformoterol  15 mcg Nebulization BID  . aspirin  81 mg Oral Daily  .  budesonide  0.5 mg Nebulization BID  . citalopram  40 mg Oral Daily  . heparin  5,000 Units Subcutaneous 3 times per day  . hydrochlorothiazide  25 mg Oral [id number]  . nebivolol  10 mg Oral Daily  . potassium chloride SA  20 mEq Oral BID  . vitamin C  500 mg Oral Daily   Continuous Infusions:  PRN Meds:.albuterol, ALPRAZolam, alum & mag hydroxide-simeth, cyclobenzaprine, diazepam, guaiFENesin-dextromethorphan, HYDROcodone-acetaminophen, ondansetron (ZOFRAN) IV, polyethylene glycol  DVT Prophylaxis   Heparin   Lab Results  Component Value Date   PLT 154 11/10/2013    Antibiotics   Anti-infectives   None          Subjective:   Renee Adkins today has, No headache, No chest pain, No abdominal pain - No Nausea, No new weakness tingling or numbness, No Cough - SOB.   Objective:   Filed Vitals:   11/11/13 0026 11/11/13 0226 11/11/13 0426 11/11/13 0601  BP: 105/45 113/54 101/65 117/58  Pulse: 58 70 55 55  Temp:    97.4 F (36.3 C)  TempSrc:    Oral  Resp:      Height:      Weight:    71.759 kg (158 lb 3.2 oz)  SpO2: 98% 100% 100% 100%    Wt Readings from Last 3 Encounters:  11/11/13 71.759 kg (158 lb 3.2 oz)  02/22/13 78.926 kg (174  lb)  01/29/12 78.926 kg (174 lb)     Intake/Output Summary (Last 24 hours) at 11/11/13 0949 Last data filed at 11/11/13 0610  Gross per 24 hour  Intake    240 ml  Output    325 ml  Net    -85 ml     Physical Exam  Awake Alert, Oriented X 3, No new F.N deficits, Normal affect [place].AT,PERRAL Supple Neck,No JVD, No cervical lymphadenopathy appriciated.  Symmetrical Chest wall movement, Good air movement bilaterally, CTAB RRR,No Gallops,Rubs or new Murmurs, No Parasternal Heave +ve B.Sounds, Abd Soft, Non tender, No organomegaly appriciated, No rebound - guarding or rigidity. No Cyanosis, Clubbing or edema, No new Rash or bruise      Data Review   Micro Results No results found for this or any previous visit (from the past 240  hour(s)).  Radiology Reports Dg Chest 2 View  11/10/2013   CLINICAL DATA:  Chest pain, asthma  EXAM: CHEST  2 VIEW  COMPARISON:  01/25/2012  FINDINGS: The heart size and mediastinal contours are within normal limits. Both lungs are clear. The visualized skeletal structures are unremarkable.  IMPRESSION: No active cardiopulmonary disease.   Electronically Signed   By: Alcide Clever M.D.   On: 11/10/2013 11:11   Ct Head Wo Contrast  11/10/2013   CLINICAL DATA:  Right arm and leg weakness.  EXAM: CT HEAD WITHOUT CONTRAST  TECHNIQUE: Contiguous axial images were obtained from the base of the skull through the vertex without intravenous contrast.  COMPARISON:  Head CT 11/01/2009.  FINDINGS: The brain has a normal appearance without evidence of malformation, atrophy, old or acute infarction, mass lesion, hemorrhage, hydrocephalus or extra-axial collection. The calvarium appears normal. Visualized sinuses, middle ears and mastoids are clear except for chronic appearing mucosal thickening of the maxillary sinuses.  IMPRESSION: Normal head CT.  Chronic inflammatory changes of the maxillary sinuses.   Electronically Signed   By: Paulina Fusi M.D.   On: 11/10/2013 10:44   Mr Brain Wo Contrast  11/10/2013   CLINICAL DATA:  Right-sided numbness and weakness  EXAM: MRI HEAD WITHOUT CONTRAST  TECHNIQUE: Multiplanar, multiecho pulse sequences of the brain and surrounding structures were obtained without intravenous contrast.  COMPARISON:  CT 11/10/2013, MRI 07/18/2009  FINDINGS: Ventricle size is normal. Cerebral volume is normal. Craniocervical junction is normal. Pituitary is normal in size.  Negative for acute infarct. Several small white matter hyperintensities bilaterally, new since 2010 and most consistent with mild chronic microvascular ischemia. Brainstem and cerebellum are normal.  Negative for intracranial hemorrhage.  No mass or fluid collection.  Mucosal edema in the paranasal sinuses.  IMPRESSION: No acute  abnormality.  Within normal limits for age.   Electronically Signed   By: Marlan Palau M.D.   On: 11/10/2013 14:10    CBC  Recent Labs Lab 11/10/13 1040  WBC 7.6  HGB 13.4  HCT 38.6  PLT 154  MCV 90.6  MCH 31.5  MCHC 34.7  RDW 13.6  LYMPHSABS 2.8  MONOABS 0.5  EOSABS 0.3  BASOSABS 0.0    Chemistries   Recent Labs Lab 11/10/13 1040  NA 139  K 3.8  CL 101  CO2 25  GLUCOSE 99  BUN 14  CREATININE 0.92  CALCIUM 9.3   ------------------------------------------------------------------------------------------------------------------ estimated creatinine clearance is 52.3 ml/min (by C-G formula based on Cr of 0.92). ------------------------------------------------------------------------------------------------------------------  Recent Labs  11/10/13 1632  HGBA1C 5.6   ------------------------------------------------------------------------------------------------------------------ No results found for this basename: CHOL, HDL, LDLCALC,  TRIG, CHOLHDL, LDLDIRECT,  in the last 72 hours ------------------------------------------------------------------------------------------------------------------ No results found for this basename: TSH, T4TOTAL, FREET3, T3FREE, THYROIDAB,  in the last 72 hours ------------------------------------------------------------------------------------------------------------------ No results found for this basename: VITAMINB12, FOLATE, FERRITIN, TIBC, IRON, RETICCTPCT,  in the last 72 hours  Coagulation profile No results found for this basename: INR, PROTIME,  in the last 168 hours  No results found for this basename: DDIMER,  in the last 72 hours  Cardiac Enzymes  Recent Labs Lab 11/10/13 1040 11/10/13 1632 11/10/13 2226  TROPONINI <0.30 <0.30 <0.30   ------------------------------------------------------------------------------------------------------------------ No components found with this basename: POCBNP,      Time  Spent in minutes   35   Floretta Petro K M.D on 11/11/2013 at 9:49 AM  Between 7am to 7pm - Pager - [phone]  After 7pm go to [email or web address] - password [email or web address]  And look for the night coverage person covering for me after hours  [facility] Office  [phone]

## 2013-11-11 NOTE — Evaluation (Addendum)
Physical Therapy Evaluation Patient Details Name: Renee Adkins MRN: [id number] DOB: 1945/11/22 Today's Date: 11/11/2013   History of Present Illness  Renee Adkins  is a 68 y.o. female, with history of anxiety, asthma, chronically elevated d-dimer, chronic diastolic CHF, paresthesias who is in her usual state of health until yesterday evening when she started to experience some right-sided tingling and numbness which starts in her chest, goes into her right arm and sometimes into her right leg, she also describes some comparative weakness on the right side as compared to the left. CT and MRI of brain negative as well as MRI of cervical spine  Clinical Impression  Pt very pleasant with decreased strength RLE proximally as well as report of decreased sensation compared to LLE. Pt with loss of target with visual smooth pursuits and difficulty with maintaining target with saccades. Pt able to perform rapid head turns with maintained visual target but would not relax cervical muscles to allow head thrust.  With sit to sidely bilaterally no dizziness or nystagmus however with return to sitting from right side pt with 10 seconds of 8/10 dizziness. Pt with decreased strength and gait from baseline as well as dizziness with mobility who will benefit from acute therapy to address above in order to return pt to PLOF. If unable to complete formal vestibular assessment by colleagues acutely would benefit from OPPT vestibular evaluation. Pt educated for LLE HEP and encouraged to perform.     Follow Up Recommendations Outpatient PT (vestibular)    Equipment Recommendations  None recommended by PT    Recommendations for Other Services       Precautions / Restrictions Precautions Precautions: None      Mobility  Bed Mobility Overal bed mobility: Independent                Transfers Overall transfer level: Independent                  Ambulation/Gait Ambulation/Gait assistance:  Modified independent (Device/Increase time) Ambulation Distance (Feet): 300 Feet Assistive device: None Gait Pattern/deviations: Step-through pattern;Decreased stride length     General Gait Details: pt with cautious gait with ability to increase speed with cues but not able to return to normal speed and stride. pt reports at baseline she walks quickly but then gets out of breath when she does  Information systems manager Rankin (Stroke Patients Only) Modified Rankin (Stroke Patients Only) Pre-Morbid Rankin Score: No symptoms Modified Rankin: No significant disability     Balance Overall balance assessment: Independent                               Standardized Balance Assessment Standardized Balance Assessment : Berg Balance Test Berg Balance Test Sit to Stand: Able to stand without using hands and stabilize independently Standing Unsupported: Able to stand safely 2 minutes Sitting with Back Unsupported but Feet Supported on Floor or Stool: Able to sit safely and securely 2 minutes Stand to Sit: Sits safely with minimal use of hands Transfers: Able to transfer safely, minor use of hands Standing Unsupported with Eyes Closed: Able to stand 10 seconds safely Standing Ubsupported with Feet Together: Able to place feet together independently and stand 1 minute safely From Standing, Reach Forward with Outstretched Arm: Can reach forward >12 cm safely (5") From Standing Position, Pick up Object from Floor: Able  to pick up shoe safely and easily From Standing Position, Turn to Look Behind Over each Shoulder: Looks behind from both sides and weight shifts well Turn 360 Degrees: Able to turn 360 degrees safely in 4 seconds or less Standing Unsupported, Alternately Place Feet on Step/Stool: Able to stand independently and complete 8 steps >20 seconds Standing Unsupported, One Foot in Front: Able to plae foot ahead of the other independently and  hold 30 seconds Standing on One Leg: Able to lift leg independently and hold 5-10 seconds Total Score: 52         Pertinent Vitals/Pain No pain VSS    Home Living Family/patient expects to be discharged to:: Private residence Living Arrangements: Alone Available Help at Discharge: Friend(s) Type of Home: House Home Access: Stairs to enter   [employer] of Steps: 2 Home Layout: One level Home Equipment: Shower seat;Cane - single point;Walker - 2 wheels;Bedside commode Additional Comments: had R TKR 2013    Prior Function Level of Independence: Independent         Comments: independent with home mgt, cooking and still drives. In school fulltime     Hand Dominance   Dominant Hand: Right    Extremity/Trunk Assessment   Upper Extremity Assessment: Defer to OT evaluation           Lower Extremity Assessment: RLE deficits/detail RLE Deficits / Details: 3/5 hip flexion, knee flexion and extension. Dorsiflexion 5/5    Cervical / Trunk Assessment: Normal  Communication   Communication: No difficulties  Cognition Arousal/Alertness: Awake/alert Behavior During Therapy: WFL for tasks assessed/performed Overall Cognitive Status: Within Functional Limits for tasks assessed                      General Comments      Exercises        Assessment/Plan    PT Assessment Patient needs continued PT services  PT Diagnosis Abnormality of gait   PT Problem List Decreased activity tolerance;Other (comment) (dizziness)  PT Treatment Interventions Gait training;Balance training;Therapeutic activities;Patient/family education   PT Goals (Current goals can be found in the Care Plan section) Acute Rehab PT Goals Patient Stated Goal: be able to return to school PT Goal Formulation: With patient Time For Goal Achievement: 11/18/13 Potential to Achieve Goals: Good Additional Goals Additional Goal #1: Pt will score 56 on berg to decrease fall risk     Frequency Min 3X/week   Barriers to discharge        Co-evaluation               End of Session Equipment Utilized During Treatment: Gait belt Activity Tolerance: Patient tolerated treatment well Patient left: in chair;with call bell/phone within reach      Functional Assessment Tool Used: clinical judgement Functional Limitation: Mobility: Walking and moving around Mobility: Walking and Moving Around Current Status [phone]): At least 1 percent but less than 20 percent impaired, limited or restricted Mobility: Walking and Moving Around Goal Status [phone]): 0 percent impaired, limited or restricted    Time: 1141-1208 PT Time Calculation (min): 27 min   Charges:   PT Evaluation $Initial PT Evaluation Tier I: 1 Procedure PT Treatments $Therapeutic Activity: 8-22 mins   PT G Codes:   Functional Assessment Tool Used: clinical judgement Functional Limitation: Mobility: Walking and moving around    Delorse Lek 11/11/2013, 12:27 PM Delaney Meigs, PT [phone]

## 2013-11-11 NOTE — Evaluation (Signed)
Speech Language Pathology Evaluation Patient Details Name: Cathe Monslvira S [place]head MRN: [id number] DOB: 02/13/1946 Today's Date: 11/11/2013 Time: 1605-     Problem List:  Patient Active Problem List   Diagnosis Date Noted  . Hypoxia 11/10/2013  . Numbness and tingling 11/10/2013  . Symptomatic menopausal or female climacteric states 02/22/2013  . Chronic diastolic congestive heart failure 01/29/2012  . D-dimer, elevated 01/27/2012  . Paresthesia 01/27/2012  . Acute renal failure 01/27/2012  . Hypokalemia 01/27/2012  . Asthma 01/27/2012  . Chest pain at rest 01/25/2012  . Anxiety 01/25/2012  . GERD (gastroesophageal reflux disease) 01/25/2012  . HYPERTENSION 09/28/2008  . ASTHMA 09/28/2008  . COUGH 09/28/2008   Past Medical History:  Past Medical History  Diagnosis Date  . Hypertension   . Asthma   . Paresthesia 01/27/2012  . Chronic diastolic congestive heart failure 01/29/2012  . Anxiety 01/25/2012   Past Surgical History:  Past Surgical History  Procedure Laterality Date  . Vesicovaginal fistula closure w/ tah    . Total knee arthroplasty  Jan 2011    rt knee   HPI:  Earline Mayottelvira Lafalce  is a 68 y.o. female, with history of anxiety, asthma, chronically elevated d-dimer, chronic diastolic CHF, paresthesias who is in her usual state of health until yesterday evening when she started to experience some right-sided tingling and numbness which starts in her chest, goes into her right arm and sometimes into her right leg, she also describes some comparative weakness on the right side as compared to the left. CT and MRI of brain negative as well as MRI of cervical spine    Assessment / Plan / Recommendation Clinical Impression  Patient with no evidence of speech,language, or cognitive deficits.  No f/u ST needed.    SLP Assessment  Patient does not need any further Speech Lanaguage Pathology Services    Follow Up Recommendations  None    Frequency and Duration        Pertinent  Vitals/Pain C/O chest pain and pain in right arm.  RN notified.   SLP Goals     SLP Evaluation Prior Functioning  Cognitive/Linguistic Baseline: Within functional limits Type of Home: House  Lives With: Alone Available Help at Discharge: Friend(s) Education: [employer]; now taking IT classes. Vocation: Therapist, occupationaltudent   Cognition  Overall Cognitive Status: Within Functional Limits for tasks assessed Arousal/Alertness: Awake/alert Orientation Level: Oriented X4 Attention: Divided Divided Attention: Appears intact Memory: Appears intact Awareness: Appears intact Problem Solving: Appears intact Safety/Judgment: Appears intact    Comprehension  Auditory Comprehension Overall Auditory Comprehension: Appears within functional limits for tasks assessed Yes/No Questions: Within Functional Limits Commands: Within Functional Limits Conversation: Complex Visual Recognition/Discrimination Discrimination: Not tested Reading Comprehension Reading Status: Not tested    Expression Expression Primary Mode of Expression: Verbal Verbal Expression Overall Verbal Expression: Appears within functional limits for tasks assessed Initiation: No impairment Level of Generative/Spontaneous Verbalization: Conversation Repetition: No impairment Naming: No impairment Pragmatics: No impairment Non-Verbal Means of Communication: Not applicable Written Expression Dominant Hand: Right Written Expression: Not tested   Oral / Motor Oral Motor/Sensory Function Overall Oral Motor/Sensory Function: Appears within functional limits for tasks assessed Motor Speech Overall Motor Speech: Appears within functional limits for tasks assessed Respiration: Within functional limits Phonation: Normal Resonance: Within functional limits Articulation: Within functional limitis Intelligibility: Intelligible Motor Planning: Witnin functional limits Motor Speech Errors: Not applicable   GO     Maryjo RochesterWillis, Keelon Zurn  T 11/11/2013, 4:27 PM

## 2013-11-11 NOTE — Care Management Note (Addendum)
  Page 1 of 1   11/12/2013     5:16:23 PM   CARE MANAGEMENT NOTE 11/12/2013  Patient:  Renee Adkins   Account Number:  [id number]  Date Initiated:  11/11/2013  Documentation initiated by:  Donato SchultzHUTCHINSON,Nohelia Valenza  Subjective/Objective Assessment:   Admitted with numbness and tingling     Action/Plan:   CM to follow for disposition needs   Anticipated DC Date:  11/14/2013   Anticipated DC Plan:  HOME/SELF CARE         Choice offered to / List presented to:             Status of service:  Completed, signed off Medicare Important Message given?   (If response is "NO", the following Medicare IM given date fields will be blank) Date Medicare IM given:   Date Additional Medicare IM given:    Discharge Disposition:  HOME/SELF CARE  Per UR Regulation:  Reviewed for med. necessity/level of care/duration of stay  If discussed at Long Length of Stay Meetings, dates discussed:    Comments:  11/12/2013 PT RECS:  Outpatient PT (Vestibular) Disposition:  Home / Self care.  OP Vestibular therapy - [phone] fax Donato Schultzrystal Gervase Colberg, RN, BSN, [facility], [place] 11/12/2013   Patient admitted to IP status.  Order changed to OBS at 11:10am. MD order given for OBS 11/11/2013 11:10am Har note and Stop Bill completed. Bristal Steffy RN, BSN, [facility], [place] 11/11/2013

## 2013-11-11 NOTE — Progress Notes (Signed)
  Echocardiogram 2D Echocardiogram has been performed.  Renee Adkins, Renee Adkins 11/11/2013, 9:09 AM

## 2013-11-11 NOTE — Progress Notes (Signed)
*  PRELIMINARY RESULTS* Vascular Ultrasound Carotid Duplex (Doppler) has been completed.  Preliminary findings: Bilateral:  1-39% ICA stenosis.  Vertebral artery flow is antegrade.      Farrel DemarkJill Eunice, RDMS, RVT  11/11/2013, 8:27 AM

## 2013-11-11 NOTE — Progress Notes (Signed)
UR completed Errin Whitelaw K. Curlee Bogan, RN, BSN, MSHL, CCM  11/11/2013 11:12 AM

## 2013-11-11 NOTE — Progress Notes (Signed)
Subjective: Patient had no new complaints. She still experiencing numbness involving entire right side.  Objective: Current vital signs: BP 113/48  Pulse 70  Temp(Src) 97.7 F (36.5 C) (Oral)  Resp 18  Ht 5\' 1"  (1.549 m)  Wt 71.759 kg (158 lb 3.2 oz)  BMI 29.91 kg/m2  SpO2 97%  Neurologic Exam: Alert and in no acute distress. Mental status was normal. Speech was normal. Patient moved extremities equally with normal strength throughout. Ambulation was normal.  Laboratory studies: Hemoglobin A1c and fasting lipid panel were normal.  MRI of cervical spine showed no signs of cord compression or cord lesion, as well as no signs of acute cervical radiculopathy.  Carotid Doppler study and 2-D echocardiogram was unremarkable.  Medications: I have reviewed the patient's current medications.  Assessment/Plan: Persistent right-sided numbness of unclear etiology. Patient has no signs of intracranial abnormality nor indications of cervical spine lesion. Psychophysiologic factors suspected. Workup has been unremarkable for stroke risk. Recommend continuing aspirin daily 81 mg.  No further neurological intervention is indicated at this point. As such I will sign off on her care, but remain available for reevaluation if indicated.  C.R. Roseanne RenoStewart, MD [facility]  [phone]  11/11/2013  4:31 PM

## 2013-11-11 NOTE — Progress Notes (Signed)
Occupational Therapy Discharge Patient Details Name: Renee Adkins MRN: [id number] DOB: Jan 10, 1946 Today's Date: 11/11/2013 Time: [id number] OT Time Calculation (min): 24 min  Patient discharged from OT services secondary to no further acute OT services indicated at this time.  Please see latest therapy progress note for current level of functioning  Progress and discharge plan discussed with patient and/or caregiver  GO Functional Assessment Tool Used: clinical judgement Functional Limitation: Self care Self Care Current Status ([id number](G8987): At least 1 percent but less than 20 percent impaired, limited or restricted Self Care Discharge Status [phone](G8989): At least 1 percent but less than 20 percent impaired, limited or restricted   Galen ManilaSpencer, Adanely Reynoso Jeanette 11/11/2013, 1:32 PM

## 2013-11-11 NOTE — Evaluation (Signed)
Occupational Therapy Evaluation Patient Details Name: Renee Adkins MRN: [id number] DOB: 05-15-1946 Today's Date: 11/11/2013    History of Present Illness Renee Adkins  is a 68 y.o. female, with history of anxiety, asthma, chronically elevated d-dimer, chronic diastolic CHF, paresthesias who is in her usual state of health until yesterday evening when she started to experience some right-sided tingling and numbness which starts in her chest, goes into her right arm and sometimes into her right leg, she also describes some comparative weakness on the right side as compared to the left. CT and MRI of brain negative as well as MRI of cervical spine   Clinical Impression   Pt is Mod I with ADLs and ADL mobility safety. No further acute OT services are indicated at this time and pt should continue with acute PT services to address functional balance/mobility safety    Follow Up Recommendations  No OT follow up    Equipment Recommendations  None recommended by OT    Recommendations for Other Services       Precautions / Restrictions Precautions Precautions: None Restrictions Weight Bearing Restrictions: No      Mobility Bed Mobility Overal bed mobility: Independent                Transfers Overall transfer level: Independent Equipment used: None                  Balance Overall balance assessment: Independent, pt able correct slight LOB to R side when challenged by therapist                                 Sit to Stand: Able to stand without using hands and stabilize independently         ADL Overall ADL's : Modified independent                                       General ADL Comments: decreased pace for safety due to decreased balance     Vision  wears glasses at all times                              Pertinent Vitals/Pain 7/10 pain in R chest radiating down R UE Nrsg aware and pt premedicated VSS      Hand Dominance Right   Extremity/Trunk Assessment Upper Extremity Assessment Upper Extremity Assessment: Generalized weakness;RUE deficits/detail RUE Deficits / Details: 3/5 strength grossly RUE Sensation: decreased light touch   Lower Extremity Assessment Lower Extremity Assessment: Defer to PT evaluation RLE Deficits / Details: 3/5 hip flexion, knee flexion and extension. Dorsiflexion 5/5 RLE Sensation: decreased light touch (pt reports lateral thigh tingling and the rest of the leg as decreased sensation compared to LLE)   Cervical / Trunk Assessment Cervical / Trunk Assessment: Normal   Communication Communication Communication: No difficulties   Cognition Arousal/Alertness: Awake/alert Behavior During Therapy: WFL for tasks assessed/performed Overall Cognitive Status: Within Functional Limits for tasks assessed                     General Comments   Pt pleasant and cooperative                 Home Living Family/patient expects to be discharged to:: Private residence Living Arrangements:  Alone Available Help at Discharge: Friend(s) Type of Home: House Home Access: Stairs to enter [employer] of Steps: 2   Home Layout: One level     Bathroom Shower/Tub: Tub/shower unit Shower/tub characteristics: Engineer, building services: Standard     Home Equipment: Production assistant, radio - single point;Walker - 2 wheels;Bedside commode   Additional Comments: had R TKR 2013      Prior Functioning/Environment Level of Independence: Independent        Comments: independent with home mgt, cooking and still drives. In school fulltime    OT Diagnosis:     OT Problem List:     OT Treatment/Interventions:      OT Goals(Current goals can be found in the care plan section) Acute Rehab OT Goals Patient Stated Goal: be able to return to school  OT Frequency:     Barriers to D/C:  none                        End of Session    Activity  Tolerance: Patient tolerated treatment well Patient left: in chair;with call bell/phone within reach   Time: 1117-1141 OT Time Calculation (min): 24 min Charges:  OT General Charges $OT Visit: 1 Procedure OT Evaluation $Initial OT Evaluation Tier I: 1 Procedure OT Treatments $Therapeutic Activity: 8-22 mins G-Codes: OT G-codes **NOT FOR INPATIENT CLASS** Functional Assessment Tool Used: clinical judgement Functional Limitation: Self care Self Care Current Status (Y8657): At least 1 percent but less than 20 percent impaired, limited or restricted Self Care Discharge Status [phone]): At least 1 percent but less than 20 percent impaired, limited or restricted  Galen Manila 11/11/2013, 1:27 PM

## 2013-11-11 NOTE — Progress Notes (Signed)
OT Cancellation Note  Patient Details Name: Renee Adkins MRN: [id number] DOB: 1946/03/26   Cancelled Treatment:    Reason Eval/Treat Not Completed: Patient at procedure or test/ unavailable. Will re attempt later today as time allows  Galen ManilaSpencer, Maelle Sheaffer Jeanette 11/11/2013, 9:36 AM

## 2013-11-12 MED ORDER — ASPIRIN 81 MG PO CHEW: 81.0000 mg | CHEWABLE_TABLET | Freq: Every day | ORAL | Status: DC

## 2013-11-12 NOTE — Progress Notes (Signed)
Physical Therapy Treatment Patient Details Name: Renee Adkins MRN: [id number] DOB: 1946-01-24 Today's Date: 11/12/2013    History of Present Illness Renee Adkins  is a 68 y.o. female, with history of anxiety, asthma, chronically elevated d-dimer, chronic diastolic CHF, paresthesias who is in her usual state of health until yesterday evening when she started to experience some right-sided tingling and numbness which starts in her chest, goes into her right arm and sometimes into her right leg, she also describes some comparative weakness on the right side as compared to the left. CT and MRI of brain negative as well as MRI of cervical spine    PT Comments    Patient independent with ambulation and mobility.  No dizziness reported today - attempted to elicit with supine <> sit and gait with head turns.  No symptoms reported.  Balance good with gait.  Encouraged patient to f/u with OP PT with any increase in dizziness/symptoms.  No further acute PT needs identified - patient ready for discharge from PT perspective.  Follow Up Recommendations  Outpatient PT (Vestibular)     Equipment Recommendations  None recommended by PT    Recommendations for Other Services       Precautions / Restrictions Precautions Precautions: None Restrictions Weight Bearing Restrictions: No    Mobility  Bed Mobility Overal bed mobility: Independent                Transfers Overall transfer level: Independent Equipment used: None                Ambulation/Gait Ambulation/Gait assistance: Independent Ambulation Distance (Feet): 200 Feet Assistive device: None Gait Pattern/deviations: Step-through pattern;Decreased stride length Gait velocity: Slow Gait velocity interpretation: Below normal speed for age/gender General Gait Details: Patient with slow steady gait pattern.  No loss of balance during gait.  No dizziness with high level balance activities.   Stairs             Wheelchair Mobility    Modified Rankin (Stroke Patients Only)       Balance Overall balance assessment: Independent                                  Cognition Arousal/Alertness: Awake/alert Behavior During Therapy: WFL for tasks assessed/performed Overall Cognitive Status: Within Functional Limits for tasks assessed                      Exercises      General Comments        Pertinent Vitals/Pain     Home Living                      Prior Function            PT Goals (current goals can now be found in the care plan section) Progress towards PT goals: Progressing toward goals    Frequency  Min 3X/week    PT Plan Current plan remains appropriate    Co-evaluation             End of Session Equipment Utilized During Treatment: Gait belt Activity Tolerance: Patient tolerated treatment well Patient left: in chair;with call bell/phone within reach;with family/visitor present     Time: 6213-0865 PT Time Calculation (min): 11 min  Charges:  $Gait Training: 8-22 mins  G Codes:  Functional Assessment Tool Used: clinical judgement Functional Limitation: Mobility: Walking and moving around Mobility: Walking and Moving Around Goal Status [phone]): 0 percent impaired, limited or restricted Mobility: Walking and Moving Around Discharge Status [phone]): 0 percent impaired, limited or restricted   Vena Austria 11/12/2013, 1:28 PM Durenda Hurt. Renaldo Fiddler, [facility] Acute Rehab Services Pager [phone]

## 2013-11-12 NOTE — Discharge Instructions (Signed)
Follow with Primary MD Lolita PatellaEADE,ROBERT ALEXANDER, MD in 3 days   Get CBC, CMP, checked 3 days by Primary MD and again as instructed by your Primary MD. Get a 2 view Chest X ray done next visit  .   Activity: As tolerated with Full fall precautions use walker/cane & assistance as needed   Disposition Home     Diet: Heart Healthy   For Heart failure patients - Check your Weight same time everyday, if you gain over 2 pounds, or you develop in leg swelling, experience more shortness of breath or chest pain, call your Primary MD immediately. Follow Cardiac Low Salt Diet and 1.8 lit/day fluid restriction.   On your next visit with her primary care physician please Get Medicines reviewed and adjusted.  Please request your Prim.MD to go over all Hospital Tests and Procedure/Radiological results at the follow up, please get all Hospital records sent to your Prim MD by signing hospital release before you go home.   If you experience worsening of your admission symptoms, develop shortness of breath, life threatening emergency, suicidal or homicidal thoughts you must seek medical attention immediately by calling 911 or calling your MD immediately  if symptoms less severe.  You Must read complete instructions/literature along with all the possible adverse reactions/side effects for all the Medicines you take and that have been prescribed to you. Take any new Medicines after you have completely understood and accpet all the possible adverse reactions/side effects.   Do not drive and provide baby sitting services if your were admitted for syncope or siezures until you have seen by Primary MD or a Neurologist and advised to do so again.  Do not drive when taking Pain medications.    Do not take more than prescribed Pain, Sleep and Anxiety Medications  Special Instructions: If you have smoked or chewed Tobacco  in the last 2 yrs please stop smoking, stop any regular Alcohol  and or any Recreational drug  use.  Wear Seat belts while driving.   Please note  You were cared for by a hospitalist during your hospital stay. If you have any questions about your discharge medications or the care you received while you were in the hospital after you are discharged, you can call the unit and asked to speak with the hospitalist on call if the hospitalist that took care of you is not available. Once you are discharged, your primary care physician will handle any further medical issues. Please note that NO REFILLS for any discharge medications will be authorized once you are discharged, as it is imperative that you return to your primary care physician (or establish a relationship with a primary care physician if you do not have one) for your aftercare needs so that they can reassess your need for medications and monitor your lab values.

## 2013-11-12 NOTE — Discharge Summary (Addendum)
Renee Adkins, is a 68 y.o. female  DOB 1945/09/22  MRN [id number].  Admission date:  11/10/2013  Admitting Physician  Leroy Sea, MD  Discharge Date:  11/12/2013   Primary MD  [name], MD  Recommendations for primary care physician for things to follow:   Monitor clinically, consider outpatient neurosurgery evaluation for C-spine degenerative disease   Admission Diagnosis  Unspecified essential hypertension [401.9] Cough [786.2] Numbness [782.0] Paresthesia [782.0] Anxiety [300.00] GERD (gastroesophageal reflux disease) [530.81] Weakness [780.79] SOB (shortness of breath) [786.05] Hypoxia [799.02] Chronic diastolic congestive heart failure [428.32, 428.0]   Discharge Diagnosis  Unspecified essential hypertension [401.9] Cough [786.2] Numbness [782.0] Paresthesia [782.0] Anxiety [300.00] GERD (gastroesophageal reflux disease) [530.81] Weakness [780.79] SOB (shortness of breath) [786.05] Hypoxia [799.02] Chronic diastolic congestive heart failure [428.32, 428.0]     Active Problems:   HYPERTENSION   ASTHMA   Anxiety   GERD (gastroesophageal reflux disease)   Paresthesia   Chronic diastolic congestive heart failure   Hypoxia   Numbness and tingling      Past Medical History  Diagnosis Date  . Hypertension   . Asthma   . Paresthesia 01/27/2012  . Chronic diastolic congestive heart failure 01/29/2012  . Anxiety 01/25/2012    Past Surgical History  Procedure Laterality Date  . Vesicovaginal fistula closure w/ tah    . Total knee arthroplasty  Jan 2011    rt knee     Discharge Condition: Stable   Follow UP  Follow-up Information   Follow up with READE,ROBERT Lyn Hollingshead, MD. Schedule an appointment as soon as possible for a visit in 3 days.   Specialty:  Family Medicine   Contact  information:   [id number] [place] [address] [phone]       Follow up with Karn Cassis, MD. Schedule an appointment as soon as possible for a visit in 1 week.   Specialty:  Neurosurgery   Contact information:   [address] [place] [address] [phone]         Discharge Instructions  and  Discharge Medications     Discharge Orders   Future Orders Complete By Expires   Ambulatory referral to Physical Therapy  As directed    Comments:     Vestibular rehabilitation   Questions:     Iontophoresis - 4 mg/ml of dexamethasone:     T.E.N.S. Unit Evaluation and Dispense as Indicated:     Diet - low sodium heart healthy  As directed    Discharge instructions  As directed    Comments:     Follow with Primary MD [name], MD in 3 days   Get CBC, CMP, checked 3 days by Primary MD and again as instructed by your Primary MD. Get a 2 view Chest X ray done next visit  .   Activity: As tolerated with Full fall precautions use walker/cane & assistance as needed   Disposition Home  Diet: Heart Healthy   For Heart failure patients - Check your Weight same time everyday, if you gain over 2 pounds, or you develop in leg swelling, experience more shortness of breath or chest pain, call your Primary MD immediately. Follow Cardiac Low Salt Diet and 1.8 lit/day fluid restriction.   On your next visit with her primary care physician please Get Medicines reviewed and adjusted.  Please request your Prim.MD to go over all Hospital Tests and Procedure/Radiological results at the follow up, please get all Hospital records sent to your Prim MD by signing hospital release before you go home.   If you experience worsening of your admission symptoms, develop shortness of breath, life threatening emergency, suicidal or homicidal thoughts you must seek medical attention immediately by calling 911 or  calling your MD immediately  if symptoms less severe.  You Must read complete instructions/literature along with all the possible adverse reactions/side effects for all the Medicines you take and that have been prescribed to you. Take any new Medicines after you have completely understood and accpet all the possible adverse reactions/side effects.   Do not drive and provide baby sitting services if your were admitted for syncope or siezures until you have seen by Primary MD or a Neurologist and advised to do so again.  Do not drive when taking Pain medications.    Do not take more than prescribed Pain, Sleep and Anxiety Medications  Special Instructions: If you have smoked or chewed Tobacco  in the last 2 yrs please stop smoking, stop any regular Alcohol  and or any Recreational drug use.  Wear Seat belts while driving.   Please note  You were cared for by a hospitalist during your hospital stay. If you have any questions about your discharge medications or the care you received while you were in the hospital after you are discharged, you can call the unit and asked to speak with the hospitalist on call if the hospitalist that took care of you is not available. Once you are discharged, your primary care physician will handle any further medical issues. Please note that NO REFILLS for any discharge medications will be authorized once you are discharged, as it is imperative that you return to your primary care physician (or establish a relationship with a primary care physician if you do not have one) for your aftercare needs so that they can reassess your need for medications and monitor your lab values.   Increase activity slowly  As directed        Medication List         albuterol 108 (90 BASE) MCG/ACT inhaler  Commonly known as:  PROVENTIL HFA;VENTOLIN HFA  Inhale 2 puffs into the lungs every 6 (six) hours as needed for wheezing or shortness of breath.     albuterol (2.5 MG/3ML)  0.083% nebulizer solution  Commonly known as:  PROVENTIL  Inhale 2.5 mg into the lungs every 6 (six) hours as needed for wheezing.     ALPRAZolam 0.5 MG tablet  Commonly known as:  XANAX  Take 0.5 mg by mouth at bedtime as needed for sleep.     arformoterol 15 MCG/2ML Nebu  Commonly known as:  BROVANA  Take 15 mcg by nebulization 2 (two) times daily.     aspirin 81 MG chewable tablet  Chew 1 tablet (81 mg total) by mouth daily.     budesonide 0.5 MG/2ML nebulizer solution  Commonly known as:  PULMICORT  Take 0.5 mg by  nebulization 2 (two) times daily.     citalopram 40 MG tablet  Commonly known as:  CELEXA  Take 40 mg by mouth daily.     [employer] VIT D 5000 HIGH-POTENCY PO  Take 5,000 Units by mouth daily.     cyclobenzaprine 10 MG tablet  Commonly known as:  FLEXERIL  Take 10 mg by mouth 3 (three) times daily as needed. For muscle spasms     diazepam 5 MG tablet  Commonly known as:  VALIUM  Take 2.5-5 mg by mouth daily as needed. To relax     eszopiclone 3 MG Tabs  Generic drug:  Eszopiclone  Take 3 mg by mouth at bedtime as needed. For sleep  --Take immediately before bedtime     hydrochlorothiazide 25 MG tablet  Commonly known as:  HYDRODIURIL  Take 25 mg by mouth every morning.     nebivolol 10 MG tablet  Commonly known as:  BYSTOLIC  Take 10 mg by mouth daily.     potassium chloride SA 20 MEQ tablet  Commonly known as:  K-DUR,KLOR-CON  Take 1 tablet (20 mEq total) by mouth 2 (two) times daily.     vitamin C 500 MG tablet  Commonly known as:  ASCORBIC ACID  Take 500 mg by mouth daily.          Diet and Activity recommendation: See Discharge Instructions above   Consults obtained - neuro   Major procedures and Radiology Reports - PLEASE review detailed and final reports for all details, in brief -       Dg Chest 2 View  11/10/2013   CLINICAL DATA:  Chest pain, asthma  EXAM: CHEST  2 VIEW  COMPARISON:  01/25/2012  FINDINGS: The heart size and  mediastinal contours are within normal limits. Both lungs are clear. The visualized skeletal structures are unremarkable.  IMPRESSION: No active cardiopulmonary disease.   Electronically Signed   By: Alcide Clever M.D.   On: 11/10/2013 11:11   Ct Head Wo Contrast  11/10/2013   CLINICAL DATA:  Right arm and leg weakness.  EXAM: CT HEAD WITHOUT CONTRAST  TECHNIQUE: Contiguous axial images were obtained from the base of the skull through the vertex without intravenous contrast.  COMPARISON:  Head CT 11/01/2009.  FINDINGS: The brain has a normal appearance without evidence of malformation, atrophy, old or acute infarction, mass lesion, hemorrhage, hydrocephalus or extra-axial collection. The calvarium appears normal. Visualized sinuses, middle ears and mastoids are clear except for chronic appearing mucosal thickening of the maxillary sinuses.  IMPRESSION: Normal head CT.  Chronic inflammatory changes of the maxillary sinuses.   Electronically Signed   By: Paulina Fusi M.D.   On: 11/10/2013 10:44   Mr Brain Wo Contrast  11/10/2013   CLINICAL DATA:  Right-sided numbness and weakness  EXAM: MRI HEAD WITHOUT CONTRAST  TECHNIQUE: Multiplanar, multiecho pulse sequences of the brain and surrounding structures were obtained without intravenous contrast.  COMPARISON:  CT 11/10/2013, MRI 07/18/2009  FINDINGS: Ventricle size is normal. Cerebral volume is normal. Craniocervical junction is normal. Pituitary is normal in size.  Negative for acute infarct. Several small white matter hyperintensities bilaterally, new since 2010 and most consistent with mild chronic microvascular ischemia. Brainstem and cerebellum are normal.  Negative for intracranial hemorrhage.  No mass or fluid collection.  Mucosal edema in the paranasal sinuses.  IMPRESSION: No acute abnormality.  Within normal limits for age.   Electronically Signed   By: Marlan Palau M.D.   On:  11/10/2013 14:10   Mr Cervical Spine Wo Contrast  11/11/2013   CLINICAL DATA:   Right arm numbness.  EXAM: MRI CERVICAL SPINE WITHOUT CONTRAST  TECHNIQUE: Multiplanar, multisequence MR imaging was performed. No intravenous contrast was administered.  COMPARISON:  Cervical MRI 03/17/2012.  FINDINGS: The cervical alignment is normal. There is no evidence of acute cervical spine fracture or paraspinous ligamentous injury. There is stable mild heterogeneity of the marrow signal without focally suspicious lesion.  The craniocervical junction appears normal. The cervical cord is normal in signal and caliber. There are bilateral vertebral artery flow voids. Mildly prominent lymph nodes within the upper neck are stable and not pathologically enlarged.  C2-3:  Normal interspace.  C3-4: Spondylosis with slightly greater asymmetric uncinate spurring on the left. There is moderate left and mild right foraminal stenosis. No cord deformity.  C4-5: Small central disc protrusion and mild uncinate spurring. No cord deformity or foraminal compromise.  C5-6: Minimal disc bulging. No cord deformity or foraminal compromise.  C6-7:  No significant findings.  C7-T1: No significant findings.  IMPRESSION: 1. Compared with the prior study from 2013, no acute findings or clear explanation for right arm numbness identified. 2. Mildly progressive asymmetric uncinate spurring on the left at C3-4 contributing to moderate left and mild right foraminal stenosis. There is no high-grade foraminal narrowing on the right or definite nerve root encroachment. 3. No cord deformity or abnormal cord signal.   Electronically Signed   By: Roxy Horseman M.D.   On: 11/11/2013 10:05    Micro Results      No results found for this or any previous visit (from the past 240 hour(s)).   History of present illness and  Hospital Course:     Kindly see H&P for history of present illness and admission details, please review complete Labs, Consult reports and Test reports for all details in brief Renee Adkins, is a 68 y.o. female,  patient with history of  anxiety, asthma, chronically elevated d-dimer, chronic diastolic CHF, paresthesias who is in her usual state of health until yesterday evening when she started to experience some right-sided tingling and numbness which starts in her chest, goes into her right arm and sometimes into her right leg, she also describes some comparative weakness on the right side as compared to the left, no facial droop or tingling, no headache, no problems swallowing food or liquids, no chest pain or pressure, no palpitations, no cough shortness of breath, no abdominal pain, no diarrhea no weakness.     In the ER her workup was unremarkable, head CT and MRI stable, EKG lab work stable, chest x-ray stable. On ambition she was noted to be mildly hypoxic but she does not experience any shortness of breath, I was called to admit the patient for right-sided tingling numbness and some exertional hypoxia without the sensation of shortness of breath.      1. Right-sided tingling numbness. Unclear etiology likely anxiety with psychosomatic component. Head CT, head MRI, carotid US , MRI C-spine, echogram, lipid panel along with A1c all stable, seen by neurology cleared for home discharge and no further neurology workup. If symptoms recur in the future consider outpatient psych eval for anxiety management. She's currently symptom-free. As TIA cannot be completely ruled out we'll place her on 81 mg of aspirin with close outpatient followup by PCP for risks factor modulation.     Lab Results  Component Value Date   CHOL 167 11/11/2013   HDL 59 11/11/2013  LDLCALC 84 11/11/2013   TRIG 121 11/11/2013   CHOLHDL 2.8 11/11/2013    Lab Results   Component  Value  Date    HGBA1C  5.6  11/10/2013        2. Mild exertional hypoxia/SOB in ER. Completely resolved, no oxygen need, no chest pain or shortness of breath, no tachycardia, negative troponins along with BNP, no wheezing on exam, no rales, chest x-ray stable,  EKG stable, lab work is unremarkable, follow echogram. Essentially completely resolved. Ambulated in the hallway without any distress with pulse ox on room air were 93%.     3. Chronic diastolic CHF EF 55%. Clinically appears compensated, stable pro BNP and serial troponins, stable echogram EF 55% and grade 1 chronic diastolic CHF. Request PCP to monitor an outpatient setting secondary factors cardiology evaluation if needed.     4. Hypertension. Continue home medications which include HCTZ, Bystolic.      5. History of anxiety. Continue Celexa along with Valium and as needed Xanax.         Today   Subjective:   Renee Adkins today has no headache,no chest abdominal pain,no new weakness tingling or numbness, feels much better wants to go home today.    Objective:   Blood pressure 128/58, pulse 78, temperature 97.8 F (36.6 C), temperature source Oral, resp. rate 18, height 5\' 1"  (1.549 m), weight 74.844 kg (165 lb), SpO2 93.00%.   Intake/Output Summary (Last 24 hours) at 11/12/13 0934 Last data filed at 11/12/13 0907  Gross per 24 hour  Intake   1420 ml  Output    650 ml  Net    770 ml    Exam Awake Alert, Oriented *3, No new F.N deficits, Normal affect [place].AT,PERRAL Supple Neck,No JVD, No cervical lymphadenopathy appriciated.  Symmetrical Chest wall movement, Good air movement bilaterally, CTAB RRR,No Gallops,Rubs or new Murmurs, No Parasternal Heave +ve B.Sounds, Abd Soft, Non tender, No organomegaly appriciated, No rebound -guarding or rigidity. No Cyanosis, Clubbing or edema, No new Rash or bruise  Data Review   CBC w Diff: Lab Results  Component Value Date   WBC 8.0 11/11/2013   HGB 13.8 11/11/2013   HCT 40.8 11/11/2013   PLT 155 11/11/2013   LYMPHOPCT 38 11/10/2013   MONOPCT 6 11/10/2013   EOSPCT 4 11/10/2013   BASOPCT 1 11/10/2013    CMP: Lab Results  Component Value Date   NA 137 11/11/2013   K 3.8 11/11/2013   CL 97 11/11/2013   CO2 26 11/11/2013   BUN 15  11/11/2013   CREATININE 0.97 11/11/2013   PROT 6.4 02/18/2009   ALBUMIN 3.9 02/18/2009   BILITOT 0.6 02/18/2009   ALKPHOS 56 02/18/2009   AST 27 02/18/2009   ALT 16 02/18/2009  .   Total Time in preparing paper work, data evaluation and todays exam - 35 minutes  Leroy Sea M.D on 11/12/2013 at 9:34 AM  [facility] Office  [phone]

## 2014-02-14 ENCOUNTER — Ambulatory Visit: Payer: Medicare Other | Admitting: Obstetrics

## 2014-03-02 ENCOUNTER — Ambulatory Visit: Payer: Medicare Other | Admitting: Obstetrics

## 2014-03-15 ENCOUNTER — Ambulatory Visit (INDEPENDENT_AMBULATORY_CARE_PROVIDER_SITE_OTHER): Payer: Medicare Other | Admitting: Obstetrics

## 2014-03-15 ENCOUNTER — Encounter: Payer: Self-pay | Admitting: Obstetrics

## 2014-03-15 VITALS — Temp 97.3°F | Ht 61.0 in | Wt 161.0 lb

## 2014-03-15 DIAGNOSIS — N951 Menopausal and female climacteric states: Secondary | ICD-10-CM

## 2014-03-15 DIAGNOSIS — Z01419 Encounter for gynecological examination (general) (routine) without abnormal findings: Secondary | ICD-10-CM

## 2014-03-15 NOTE — Progress Notes (Addendum)
Subjective:     Renee Adkins is a 68 y.o. female here for a routine exam.  Current complaints: c/o hot flushes.    Personal health questionnaire:  Is patient [name], have a family history of breast and/or ovarian cancer: no Is there a family history of uterine cancer diagnosed at age < 2950, gastrointestinal cancer, urinary tract cancer, family member who is a Personnel officerLynch syndrome-associated carrier: no Is the patient overweight and hypertensive, family history of diabetes, personal history of gestational diabetes or PCOS: no Is patient over 3955, have PCOS,  family history of premature CHD under age 69, diabetes, smoke, have hypertension or peripheral artery disease:  no At any time, has a partner hit, kicked or otherwise hurt or frightened you?: no Over the past 2 weeks, have you felt down, depressed or hopeless?: no Over the past 2 weeks, have you felt little interest or pleasure in doing things?:no   Gynecologic History No LMP recorded. Patient is postmenopausal. Contraception: status post hysterectomy Last Pap: several yrs.. Results were: normal Last mammogram: 2015. Results were: normal  Obstetric History OB History  Gravida Para Term Preterm AB SAB TAB Ectopic Multiple Living  2 2 2       1     # Outcome Date GA Lbr Len/2nd Weight Sex Delivery Anes PTL Lv  2 TRM         Y  1 TRM         N      Past Medical History  Diagnosis Date  . Hypertension   . Asthma   . Paresthesia 01/27/2012  . Chronic diastolic congestive heart failure 01/29/2012  . Anxiety 01/25/2012    Past Surgical History  Procedure Laterality Date  . Vesicovaginal fistula closure w/ tah    . Total knee arthroplasty  Jan 2011    rt knee    Current outpatient prescriptions:albuterol (PROVENTIL HFA;VENTOLIN HFA) 108 (90 BASE) MCG/ACT inhaler, Inhale 2 puffs into the lungs every 6 (six) hours as needed for wheezing or shortness of breath. , Disp: , Rfl: ;  albuterol (PROVENTIL) (2.5 MG/3ML) 0.083%  nebulizer solution, Inhale 2.5 mg into the lungs every 6 (six) hours as needed for wheezing., Disp: , Rfl:  arformoterol (BROVANA) 15 MCG/2ML NEBU, Take 15 mcg by nebulization 2 (two) times daily. , Disp: , Rfl: ;  aspirin 81 MG chewable tablet, Chew 1 tablet (81 mg total) by mouth daily., Disp: 30 tablet, Rfl: 0;  budesonide (PULMICORT) 0.5 MG/2ML nebulizer solution, Take 0.5 mg by nebulization 2 (two) times daily. , Disp: , Rfl: ;  Cholecalciferol ([employer] VIT D 5000 HIGH-POTENCY PO), Take 5,000 Units by mouth daily., Disp: , Rfl:  citalopram (CELEXA) 40 MG tablet, Take 40 mg by mouth daily., Disp: , Rfl: ;  cyclobenzaprine (FLEXERIL) 10 MG tablet, Take 10 mg by mouth 3 (three) times daily as needed. For muscle spasms, Disp: , Rfl: ;  diazepam (VALIUM) 5 MG tablet, Take 2.5-5 mg by mouth daily as needed. To relax, Disp: , Rfl: ;  eszopiclone (LUNESTA) 1 MG TABS tablet, Take 3 mg by mouth at bedtime as needed for sleep. Take immediately before bedtime, Disp: , Rfl:  hydrochlorothiazide (HYDRODIURIL) 25 MG tablet, Take 25 mg by mouth every morning., Disp: , Rfl: ;  hydrOXYzine (ATARAX/VISTARIL) 10 MG tablet, Take 10 mg by mouth 3 (three) times daily as needed., Disp: , Rfl: ;  nebivolol (BYSTOLIC) 10 MG tablet, Take 10 mg by mouth daily.  , Disp: , Rfl: ;  pantoprazole (PROTONIX) 40 MG tablet, Take 40 mg by mouth daily., Disp: , Rfl:  potassium chloride SA (K-DUR,KLOR-CON) 20 MEQ tablet, Take 1 tablet (20 mEq total) by mouth 2 (two) times daily., Disp: 60 tablet, Rfl: 5;  promethazine-dextromethorphan (PROMETHAZINE-DM) 6.25-15 MG/5ML syrup, Take 5 mLs by mouth 4 (four) times daily as needed for cough., Disp: , Rfl: ;  vitamin C (ASCORBIC ACID) 500 MG tablet, Take 500 mg by mouth daily., Disp: , Rfl:  Allergies  Allergen Reactions  . Azithromycin Shortness Of Breath and Swelling  . Meloxicam Shortness Of Breath  . Penicillins Anaphylaxis  . Codeine     REACTION: tachycardia  . Levofloxacin   . Naproxen  Nausea And Vomiting    History  Substance Use Topics  . Smoking status: Never Smoker   . Smokeless tobacco: Never Used  . Alcohol Use: Yes     Comment: Socially     Family History  Problem Relation Age of Onset  . Asthma Neg Hx   . Allergies Neg Hx   . Heart attack Father 81      Review of Systems  Constitutional: negative for fatigue and weight loss Respiratory: negative for cough and wheezing Cardiovascular: negative for chest pain, fatigue and palpitations Gastrointestinal: negative for abdominal pain and change in bowel habits Musculoskeletal:negative for myalgias Neurological: negative for gait problems and tremors Behavioral/Psych: negative for abusive relationship, depression Endocrine: negative for temperature intolerance   Genitourinary:negative for menstrual periods, genital lesions, sexual problems and vaginal discharge.  Positive for hot flashes.  Integument/breast: negative for breast lump, breast tenderness, nipple discharge and skin lesion(s)    Objective:       Temp(Src) 97.3 F (36.3 C)  Ht 5\' 1"  (1.549 m)  Wt 161 lb (73.029 kg)  BMI 30.44 kg/m2 General:   alert  Skin:   no rash or abnormalities  Lungs:   clear to auscultation bilaterally  Heart:   regular rate and rhythm, S1, S2 normal, no murmur, click, rub or gallop  Breasts:   normal without suspicious masses, skin or nipple changes or axillary nodes  Abdomen:  normal findings: no organomegaly, soft, non-tender and no hernia  Pelvis:  External genitalia: normal general appearance Urinary system: urethral meatus normal and bladder without fullness, nontender Vaginal: normal without tenderness, induration or masses Cervix: absent Adnexa: absent Uterus:   absent   Lab Review Urine pregnancy test Labs reviewed yes Radiologic studies reviewed yes   Assessment:    Healthy female exam.   Postmenopausal Hot Flushes, prolonged.   Plan:    Education reviewed: calcium supplements, low fat,  low cholesterol diet, self breast exams and weight bearing exercise. Follow up in: 2 years.   Meds ordered this encounter  Medications  . pantoprazole (PROTONIX) 40 MG tablet    Sig: Take 40 mg by mouth daily.  . eszopiclone (LUNESTA) 1 MG TABS tablet    Sig: Take 3 mg by mouth at bedtime as needed for sleep. Take immediately before bedtime  . promethazine-dextromethorphan (PROMETHAZINE-DM) 6.25-15 MG/5ML syrup    Sig: Take 5 mLs by mouth 4 (four) times daily as needed for cough.  . hydrOXYzine (ATARAX/VISTARIL) 10 MG tablet    Sig: Take 10 mg by mouth 3 (three) times daily as needed.   Orders Placed This Encounter  Procedures  . WET PREP BY MOLECULAR PROBE

## 2014-03-16 ENCOUNTER — Other Ambulatory Visit: Payer: Self-pay | Admitting: Obstetrics

## 2014-03-16 LAB — WET PREP BY MOLECULAR PROBE
Candida species: NEGATIVE
Gardnerella vaginalis: NEGATIVE
Trichomonas vaginosis: NEGATIVE

## 2014-03-16 MED ORDER — PAROXETINE MESYLATE 7.5 MG PO CAPS: 7.5000 mg | ORAL_CAPSULE | Freq: Every day | ORAL | Status: DC

## 2014-03-16 NOTE — Addendum Note (Signed)
Addended by: Coral CeoHARPER, CHARLES A on: 03/16/2014 08:51 AM   Modules accepted: Orders

## 2014-03-21 ENCOUNTER — Ambulatory Visit: Payer: Medicare Other | Admitting: Obstetrics

## 2014-04-16 ENCOUNTER — Encounter: Payer: Self-pay | Admitting: *Deleted

## 2014-05-17 ENCOUNTER — Emergency Department (HOSPITAL_COMMUNITY): Payer: Medicare Other

## 2014-05-17 ENCOUNTER — Encounter (HOSPITAL_COMMUNITY): Payer: Self-pay | Admitting: Emergency Medicine

## 2014-05-17 ENCOUNTER — Emergency Department (HOSPITAL_COMMUNITY)
Admission: EM | Admit: 2014-05-17 | Discharge: 2014-05-17 | Disposition: A | Discharge status: Home / Self Care | Payer: Medicare Other | Attending: Emergency Medicine | Admitting: Emergency Medicine

## 2014-05-17 DIAGNOSIS — J45909 Unspecified asthma, uncomplicated: Secondary | ICD-10-CM | POA: Diagnosis not present

## 2014-05-17 DIAGNOSIS — Z23 Encounter for immunization: Secondary | ICD-10-CM | POA: Insufficient documentation

## 2014-05-17 DIAGNOSIS — I10 Essential (primary) hypertension: Secondary | ICD-10-CM | POA: Diagnosis not present

## 2014-05-17 DIAGNOSIS — S299XXA Unspecified injury of thorax, initial encounter: Secondary | ICD-10-CM | POA: Diagnosis present

## 2014-05-17 DIAGNOSIS — F419 Anxiety disorder, unspecified: Secondary | ICD-10-CM | POA: Diagnosis not present

## 2014-05-17 DIAGNOSIS — Z79899 Other long term (current) drug therapy: Secondary | ICD-10-CM | POA: Insufficient documentation

## 2014-05-17 DIAGNOSIS — S61401A Unspecified open wound of right hand, initial encounter: Secondary | ICD-10-CM | POA: Insufficient documentation

## 2014-05-17 DIAGNOSIS — W109XXA Fall (on) (from) unspecified stairs and steps, initial encounter: Secondary | ICD-10-CM | POA: Diagnosis not present

## 2014-05-17 DIAGNOSIS — S2020XA Contusion of thorax, unspecified, initial encounter: Secondary | ICD-10-CM | POA: Diagnosis not present

## 2014-05-17 DIAGNOSIS — Z880 Allergy status to penicillin: Secondary | ICD-10-CM | POA: Insufficient documentation

## 2014-05-17 DIAGNOSIS — S20211A Contusion of right front wall of thorax, initial encounter: Secondary | ICD-10-CM

## 2014-05-17 DIAGNOSIS — Z7982 Long term (current) use of aspirin: Secondary | ICD-10-CM | POA: Insufficient documentation

## 2014-05-17 DIAGNOSIS — I5032 Chronic diastolic (congestive) heart failure: Secondary | ICD-10-CM | POA: Diagnosis not present

## 2014-05-17 DIAGNOSIS — IMO0002 Reserved for concepts with insufficient information to code with codable children: Secondary | ICD-10-CM

## 2014-05-17 DIAGNOSIS — Y9289 Other specified places as the place of occurrence of the external cause: Secondary | ICD-10-CM | POA: Insufficient documentation

## 2014-05-17 DIAGNOSIS — Y9301 Activity, walking, marching and hiking: Secondary | ICD-10-CM | POA: Diagnosis not present

## 2014-05-17 MED ORDER — TRAMADOL HCL 50 MG PO TABS: 50.0000 mg | ORAL_TABLET | Freq: Four times a day (QID) | ORAL | Status: DC | PRN

## 2014-05-17 MED ORDER — TETANUS-DIPHTH-ACELL PERTUSSIS 5-2.5-18.5 LF-MCG/0.5 IM SUSP
0.5000 mL | Freq: Once | INTRAMUSCULAR | Status: AC
  Administered 2014-05-17: 0.5 mL via INTRAMUSCULAR
  Filled 2014-05-17: qty 0.5

## 2014-05-17 MED ORDER — MORPHINE SULFATE 4 MG/ML IJ SOLN
4.0000 mg | Freq: Once | INTRAMUSCULAR | Status: AC
  Administered 2014-05-17: 4 mg via INTRAMUSCULAR
  Filled 2014-05-17: qty 1

## 2014-05-17 MED ORDER — LIDOCAINE HCL 1 % IJ SOLN
30.0000 mL | Freq: Once | INTRAMUSCULAR | Status: AC
  Administered 2014-05-17: 30 mL
  Filled 2014-05-17: qty 40

## 2014-05-17 MED ORDER — OXYCODONE-ACETAMINOPHEN 5-325 MG PO TABS: 1.0000 | ORAL_TABLET | Freq: Four times a day (QID) | ORAL | Status: DC | PRN

## 2014-05-17 MED ORDER — OXYCODONE-ACETAMINOPHEN 5-325 MG PO TABS
1.0000 | ORAL_TABLET | Freq: Once | ORAL | Status: DC
  Filled 2014-05-17: qty 1

## 2014-05-17 NOTE — ED Provider Notes (Signed)
Medical screening examination/treatment/procedure(s) were conducted as a shared visit with non-physician practitioner(s) and myself.  I personally evaluated the patient during the encounter.   EKG Interpretation None      See additional note for details  Shon Batonourtney F Eleshia Wooley, MD 05/17/14 1943

## 2014-05-17 NOTE — ED Notes (Addendum)
Pt ambulated in hallway with no difficulty 

## 2014-05-17 NOTE — ED Notes (Signed)
Bed: WA06 Expected date:  Expected time:  Means of arrival:  Comments: EMS-fall 

## 2014-05-17 NOTE — ED Provider Notes (Signed)
LACERATION REPAIR Performed by: Marval RegalHess, Lennix Kneisel, Morgan Armstrong, PA student Authorized by: Celene SkeenHess, Daylan Juhnke Consent: Verbal consent obtained. Risks and benefits: risks, benefits and alternatives were discussed Consent given by: patient Patient identity confirmed: provided demographic data Prepped and Draped in normal sterile fashion Wound explored  Laceration Location: right hand  Laceration Length: 3 cm  No Foreign Bodies seen or palpated  Anesthesia: local infiltration  Local anesthetic: lidocaine 2% without epinephrine  Anesthetic total: 5 ml  Irrigation method: syringe Amount of cleaning: standard  Skin closure: 4-0 prolene  Number of sutures: 8  Technique: simple interrupted  Patient tolerance: Patient tolerated the procedure well with no immediate complications.   Trevor MaceRobyn M Albert, PA-C 05/17/14 1219

## 2014-05-17 NOTE — ED Provider Notes (Addendum)
CSN: [id number]     Arrival date & time 05/17/14  1017 History   First MD Initiated Contact with Patient 05/17/14 1023     Chief Complaint  Patient presents with  . Fall     (Consider location/radiation/quality/duration/timing/severity/associated sxs/prior Treatment) HPI  This is a 68 year old female who presents following a fall. Patient reports that she was walking down some steps when she missed the last step. She fell onto her right side. She reports pain to the right chest, right breast area, and right hand. She did not hit her at her lose consciousness. Unknown last tetanus shot. She does take an aspirin daily. Current pain is 10 out of 10. She is right-handed.  Past Medical History  Diagnosis Date  . Hypertension   . Asthma   . Paresthesia 01/27/2012  . Chronic diastolic congestive heart failure 01/29/2012  . Anxiety 01/25/2012   Past Surgical History  Procedure Laterality Date  . Vesicovaginal fistula closure w/ tah    . Total knee arthroplasty  Jan 2011    rt knee   Family History  Problem Relation Age of Onset  . Asthma Neg Hx   . Allergies Neg Hx   . Heart attack Father 3956   History  Substance Use Topics  . Smoking status: Never Smoker   . Smokeless tobacco: Never Used  . Alcohol Use: Yes     Comment: Socially    OB History   Grav Para Term Preterm Abortions TAB SAB Ect Mult Living   2 2 2       1      Review of Systems  Constitutional: Negative for fever.  Respiratory: Positive for chest tightness. Negative for shortness of breath.   Cardiovascular: Positive for chest pain.  Gastrointestinal: Negative for nausea, vomiting and abdominal pain.  Genitourinary: Negative for dysuria.  Musculoskeletal: Negative for back pain.  Skin: Positive for wound. Negative for color change.  Neurological: Negative for headaches.  Psychiatric/Behavioral: Negative for confusion.  All other systems reviewed and are negative.     Allergies  Azithromycin; Levofloxacin;  Meloxicam; Penicillins; Codeine; and Naproxen  Home Medications   Prior to Admission medications   Medication Sig Start Date End Date Taking? Authorizing Provider  albuterol (PROVENTIL HFA;VENTOLIN HFA) 108 (90 BASE) MCG/ACT inhaler Inhale 2 puffs into the lungs every 6 (six) hours as needed for wheezing or shortness of breath.    Yes Historical Provider, MD  arformoterol (BROVANA) 15 MCG/2ML NEBU Take 15 mcg by nebulization 2 (two) times daily.    Yes Historical Provider, MD  aspirin EC 81 MG tablet Take 81 mg by mouth every evening.   Yes Historical Provider, MD  benzonatate (TESSALON) 100 MG capsule Take 100 mg by mouth 3 (three) times daily as needed for cough.   Yes Historical Provider, MD  budesonide (PULMICORT) 0.5 MG/2ML nebulizer solution Take 0.5 mg by nebulization 2 (two) times daily.    Yes Historical Provider, MD  Cholecalciferol ([employer] VIT D 5000 HIGH-POTENCY PO) Take 5,000 Units by mouth daily.   Yes Historical Provider, MD  citalopram (CELEXA) 40 MG tablet Take 40 mg by mouth daily.   Yes Historical Provider, MD  cyclobenzaprine (FLEXERIL) 10 MG tablet Take 10 mg by mouth 3 (three) times daily as needed. For muscle spasms   Yes Historical Provider, MD  diazepam (VALIUM) 5 MG tablet Take 2.5 mg by mouth daily as needed for anxiety. To relax   Yes Historical Provider, MD  Eszopiclone (ESZOPICLONE) 3 MG TABS Take  3 mg by mouth at bedtime as needed (insomnia). Take immediately before bedtime   Yes Historical Provider, MD  hydrOXYzine (ATARAX/VISTARIL) 10 MG tablet Take 10 mg by mouth daily as needed for itching.    Yes Historical Provider, MD  nebivolol (BYSTOLIC) 10 MG tablet Take 20 mg by mouth daily.    Yes Historical Provider, MD  pantoprazole (PROTONIX) 40 MG tablet Take 40 mg by mouth daily.   Yes Historical Provider, MD  PARoxetine Mesylate (BRISDELLE) 7.5 MG CAPS Take 7.5 mg by mouth at bedtime. 03/16/14  Yes Brock Bad, MD  polyvinyl alcohol (LIQUIFILM TEARS) 1.4 %  ophthalmic solution Place 1 drop into both eyes daily.   Yes Historical Provider, MD  potassium chloride SA (K-DUR,KLOR-CON) 20 MEQ tablet Take 20 mEq by mouth daily.   Yes Historical Provider, MD  promethazine-dextromethorphan (PROMETHAZINE-DM) 6.25-15 MG/5ML syrup Take 5 mLs by mouth 4 (four) times daily as needed for cough.   Yes Historical Provider, MD  vitamin C (ASCORBIC ACID) 500 MG tablet Take 500 mg by mouth daily.   Yes Historical Provider, MD  oxyCODONE-acetaminophen (PERCOCET/ROXICET) 5-325 MG per tablet Take 1 tablet by mouth every 6 (six) hours as needed for severe pain. 05/17/14   Shon Baton, MD   BP 139/60  Pulse 75  Temp(Src) 97.6 F (36.4 C) (Oral)  Resp 16  SpO2 95% Physical Exam  Nursing note and vitals reviewed. Constitutional: She is oriented to person, place, and time. She appears well-developed and well-nourished. No distress.  HENT:  Head: Normocephalic and atraumatic.  Mouth/Throat: Oropharynx is clear and moist.  Eyes: EOM are normal. Pupils are equal, round, and reactive to light.  Cardiovascular: Normal rate, regular rhythm and normal heart sounds.   No murmur heard. Pulmonary/Chest: Effort normal and breath sounds normal. No respiratory distress. She has no wheezes. She exhibits tenderness.  Tenderness to palpation of the left breast and left chest wall without crepitus, symmetric rise of the chest  Abdominal: Soft. Bowel sounds are normal. There is no tenderness. There is no rebound.  Musculoskeletal: She exhibits no edema.  Focused examination of the right upper extremity reveals a 3 cm laceration just proximal to the fifth MCP joint on the palmar aspect of the hand, bleeding controlled, patient has full range of motion with flexion and extension of the fingers, no tendon involvement noted, 2+ radial pulse, no snuffbox tenderness, no other deformity noted  Neurological: She is alert and oriented to person, place, and time.  Skin: Skin is warm and dry.   Psychiatric: She has a normal mood and affect.    ED Course  Procedures (including critical care time) Labs Review Labs Reviewed - No data to display  Imaging Review Dg Chest 2 View  05/17/2014   CLINICAL DATA:  Fall down steps at office. Landed on sidewalk. Acute injury. Initial visit.  EXAM: CHEST  2 VIEW  COMPARISON:  11/10/2013.  FINDINGS: Mediastinum hilar structures normal. Chronic mild interstitial prominence that is just to chronic interstitial lung disease. No focal alveolar infiltrate. No pleural effusion or pneumothorax. No acute bony abnormality.  IMPRESSION: 1. Chronic interstitial lung disease. 2. No acute cardiopulmonary disease.   Electronically Signed   By: Maisie Fus  Register   On: 05/17/2014 11:27   Dg Hand Complete Right  05/17/2014   CLINICAL DATA:  Patient fell down steps earlier today and landed on side wall. Pain primarily medially  EXAM: RIGHT HAND - COMPLETE 3+ VIEW  COMPARISON:  None.  FINDINGS: Frontal,  oblique, and lateral views were obtained. There is no demonstrable fracture or dislocation. There is narrowing of all MCP, PIP, and DIP joints. No erosive change. No radiopaque foreign body.  IMPRESSION: Multilevel osteoarthritic change. No fracture or dislocation. No radiopaque foreign body.   Electronically Signed   By: Bretta Bang M.D.   On: 05/17/2014 11:26     EKG Interpretation None      MDM   Final diagnoses:  Chest wall contusion, right, initial encounter  Laceration    Patient presents following mechanical fall. Tenderness palpation over the right chest wall and right breast without crepitus or obvious ecchymosis. ABCs intact. Vital signs stable. Patient's tetanus was updated. Plain films are negative. Laceration repaired at the bedside by PA. See additional note. Patient was ambulatory. Discuss with patient aggressive pain management at home for possible occult rib fracture. Repeat exam continues to be reassuring. She was given an incentive  spirometer and will be given a prescription for Percocet at discharge. She's to followup for suture removal in 7-10 days  After history, exam, and medical workup I feel the patient has been appropriately medically screened and is safe for discharge home. Pertinent diagnoses were discussed with the patient. Patient was given return precautions.     Shon Baton, MD 05/17/14 1319  REceived a call from the patient's pharmacy.  Patient concerned regarding percocet rx.  Allergy to codeine (documented as tachycardia; however, patient stating that it was rash and trouble breathing).  Patient has previously tolerated hydromorphone.  Discussed with both outpatient pharmacist and inpatient pharmacist at [facility] that I felt that crossreactivity with oxycodone and codeine likely low and given that she has tolerating hydromorphone in the past, feel she would likely tolerate oxycodone.  Pharmacist agreed.  Patient returned to ER requesting po morphine rx.  Feel this is not appropriate for patient's injury.  Have offered the patient tramadol.  Rx provided and percocet Rx shredded.  Shon Baton, MD 05/17/14 [phone]
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
## 2014-05-17 NOTE — ED Notes (Signed)
Per EMS- Patient was walking down some steps and missed one of the steps. Patient landed on her right side and now c/o right breast, right rib cage pain and right hand pain. Patient has a laceration to the right hand. No LOC, neck , or back pain.

## 2014-05-26 ENCOUNTER — Encounter (HOSPITAL_COMMUNITY): Payer: Self-pay | Admitting: Emergency Medicine

## 2014-05-26 ENCOUNTER — Emergency Department (HOSPITAL_COMMUNITY)
Admission: EM | Admit: 2014-05-26 | Discharge: 2014-05-26 | Disposition: A | Discharge status: Home / Self Care | Payer: Medicare Other | Attending: Emergency Medicine | Admitting: Emergency Medicine

## 2014-05-26 DIAGNOSIS — Z79899 Other long term (current) drug therapy: Secondary | ICD-10-CM | POA: Diagnosis not present

## 2014-05-26 DIAGNOSIS — Z7982 Long term (current) use of aspirin: Secondary | ICD-10-CM | POA: Diagnosis not present

## 2014-05-26 DIAGNOSIS — I10 Essential (primary) hypertension: Secondary | ICD-10-CM | POA: Diagnosis not present

## 2014-05-26 DIAGNOSIS — I5032 Chronic diastolic (congestive) heart failure: Secondary | ICD-10-CM | POA: Diagnosis not present

## 2014-05-26 DIAGNOSIS — F419 Anxiety disorder, unspecified: Secondary | ICD-10-CM | POA: Insufficient documentation

## 2014-05-26 DIAGNOSIS — Z4802 Encounter for removal of sutures: Secondary | ICD-10-CM | POA: Diagnosis present

## 2014-05-26 DIAGNOSIS — Z880 Allergy status to penicillin: Secondary | ICD-10-CM | POA: Insufficient documentation

## 2014-05-26 DIAGNOSIS — J45909 Unspecified asthma, uncomplicated: Secondary | ICD-10-CM | POA: Diagnosis not present

## 2014-05-26 NOTE — Discharge Instructions (Signed)

## 2014-05-26 NOTE — ED Provider Notes (Signed)
CSN: [id number]     Arrival date & time 05/26/14  1606 History  This chart was scribed for non-physician practitioner working with Harrold DonathNathan R. Rubin PayorPickering, MD by Elveria Risingimelie Horne, ED Scribe. This patient was seen in room WTR1/WLPT1 and the patient's care was started at 4:45 PM.   Chief Complaint  Patient presents with  . Suture / Staple Removal   The history is provided by the patient. No language interpreter was used.   HPI Comments: Renee Adkins is a 68 y.o. female who presents to the Emergency Department to have sutures removed from right hand laceration. Patient's laceration sustained 05/17/14 after a fall descending stairs. Patient's laceration was repaired same day with 8 sutures. Patient reports continued pain in her right hand however she denies other complications.    Past Medical History  Diagnosis Date  . Hypertension   . Asthma   . Paresthesia 01/27/2012  . Chronic diastolic congestive heart failure 01/29/2012  . Anxiety 01/25/2012   Past Surgical History  Procedure Laterality Date  . Vesicovaginal fistula closure w/ tah    . Total knee arthroplasty  Jan 2011    rt knee   Family History  Problem Relation Age of Onset  . Asthma Neg Hx   . Allergies Neg Hx   . Heart attack Father 757   History  Substance Use Topics  . Smoking status: Never Smoker   . Smokeless tobacco: Never Used  . Alcohol Use: Yes     Comment: Socially    OB History   Grav Para Term Preterm Abortions TAB SAB Ect Mult Living   2 2 2       1      Review of Systems  Constitutional: Negative for fever and chills.  Skin: Positive for wound.    Allergies  Azithromycin; Levofloxacin; Meloxicam; Penicillins; Codeine; and Naproxen  Home Medications   Prior to Admission medications   Medication Sig Start Date End Date Taking? Authorizing Provider  albuterol (PROVENTIL HFA;VENTOLIN HFA) 108 (90 BASE) MCG/ACT inhaler Inhale 2 puffs into the lungs every 6 (six) hours as needed for wheezing or shortness  of breath.     Historical Provider, MD  arformoterol (BROVANA) 15 MCG/2ML NEBU Take 15 mcg by nebulization 2 (two) times daily.     Historical Provider, MD  aspirin EC 81 MG tablet Take 81 mg by mouth every evening.    Historical Provider, MD  benzonatate (TESSALON) 100 MG capsule Take 100 mg by mouth 3 (three) times daily as needed for cough.    Historical Provider, MD  budesonide (PULMICORT) 0.5 MG/2ML nebulizer solution Take 0.5 mg by nebulization 2 (two) times daily.     Historical Provider, MD  Cholecalciferol ([employer] VIT D 5000 HIGH-POTENCY PO) Take 5,000 Units by mouth daily.    Historical Provider, MD  citalopram (CELEXA) 40 MG tablet Take 40 mg by mouth daily.    Historical Provider, MD  cyclobenzaprine (FLEXERIL) 10 MG tablet Take 10 mg by mouth 3 (three) times daily as needed. For muscle spasms    Historical Provider, MD  diazepam (VALIUM) 5 MG tablet Take 2.5 mg by mouth daily as needed for anxiety. To relax    Historical Provider, MD  Eszopiclone (ESZOPICLONE) 3 MG TABS Take 3 mg by mouth at bedtime as needed (insomnia). Take immediately before bedtime    Historical Provider, MD  hydrOXYzine (ATARAX/VISTARIL) 10 MG tablet Take 10 mg by mouth daily as needed for itching.     Historical Provider, MD  nebivolol (BYSTOLIC) 10 MG tablet Take 20 mg by mouth daily.     Historical Provider, MD  pantoprazole (PROTONIX) 40 MG tablet Take 40 mg by mouth daily.    Historical Provider, MD  PARoxetine Mesylate (BRISDELLE) 7.5 MG CAPS Take 7.5 mg by mouth at bedtime. 03/16/14   Brock Badharles A Harper, MD  polyvinyl alcohol (LIQUIFILM TEARS) 1.4 % ophthalmic solution Place 1 drop into both eyes daily.    Historical Provider, MD  potassium chloride SA (K-DUR,KLOR-CON) 20 MEQ tablet Take 20 mEq by mouth daily.    Historical Provider, MD  promethazine-dextromethorphan (PROMETHAZINE-DM) 6.25-15 MG/5ML syrup Take 5 mLs by mouth 4 (four) times daily as needed for cough.    Historical Provider, MD  traMADol (ULTRAM) 50  MG tablet Take 1 tablet (50 mg total) by mouth every 6 (six) hours as needed for moderate pain. 05/17/14   Shon Batonourtney F Horton, MD  vitamin C (ASCORBIC ACID) 500 MG tablet Take 500 mg by mouth daily.    Historical Provider, MD   Triage Vitals: BP 152/75  Pulse 63  Temp(Src) 98.9 F (37.2 C) (Oral)  Resp 16  SpO2 98%  Physical Exam  Nursing note and vitals reviewed. Constitutional: She is oriented to person, place, and time. She appears well-developed and well-nourished. No distress.  HENT:  Head: Normocephalic and atraumatic.  Eyes: EOM are normal.  Neck: Neck supple. No tracheal deviation present.  Cardiovascular: Normal rate.   Pulmonary/Chest: Effort normal. No respiratory distress.  Musculoskeletal: Normal range of motion.  Neurological: She is alert and oriented to person, place, and time.  Skin: Skin is warm and dry.  Well healing laceration to palmar aspect to right hand overlying 5th MCP with sutures in place. no tenderness to palpation. No evidence of infection.   Psychiatric: She has a normal mood and affect. Her behavior is normal.    ED Course  Procedures (including critical care time)  COORDINATION OF CARE: 4:49 PM- Discussed treatment plan with patient at bedside and patient agreed to plan.   SUTURE REMOVAL Performed by: Fayrene HelperRAN,Keyvin Rison  Consent: Verbal consent obtained. Patient identity confirmed: provided demographic data Time out: Immediately prior to procedure a "time out" was called to verify the correct patient, procedure, equipment, support staff and site/side marked as required.  Location details: R hand: palmar region  Wound Appearance: clean  Sutures/Staples Removed: sutures  Facility: sutures placed in this facility Patient tolerance: Patient tolerated the procedure well with no immediate complications.     Labs Review Labs Reviewed - No data to display  Imaging Review No results found.   EKG Interpretation None      MDM   Final  diagnoses:  Encounter for removal of sutures    BP 152/75  Pulse 63  Temp(Src) 98.9 F (37.2 C) (Oral)  Resp 16  SpO2 98%   I personally performed the services described in this documentation, which was scribed in my presence. The recorded information has been reviewed and is accurate.    Fayrene HelperBowie Shakisha Abend, PA-C 05/26/14 [id number]

## 2014-05-26 NOTE — ED Notes (Signed)
Pt here for removal of hand sutures placed on 10/6. No redness or edema noted.

## 2014-05-27 NOTE — ED Provider Notes (Signed)
Medical screening examination/treatment/procedure(s) were performed by non-physician practitioner and as supervising physician I was immediately available for consultation/collaboration.   EKG Interpretation None       Nour Rodrigues R. Gaynelle Pastrana, MD 05/27/14 0016 

## 2014-06-13 ENCOUNTER — Encounter (HOSPITAL_COMMUNITY): Payer: Self-pay | Admitting: Emergency Medicine

## 2014-11-11 HISTORY — PX: DG  BONE DENSITY (ARMC HX): HXRAD1102

## 2014-11-15 ENCOUNTER — Emergency Department (HOSPITAL_COMMUNITY)
Admission: EM | Admit: 2014-11-15 | Discharge: 2014-11-15 | Disposition: A | Discharge status: Home / Self Care | Payer: Medicare Other | Attending: Emergency Medicine | Admitting: Emergency Medicine

## 2014-11-15 ENCOUNTER — Encounter (HOSPITAL_COMMUNITY): Payer: Self-pay | Admitting: Emergency Medicine

## 2014-11-15 ENCOUNTER — Emergency Department (HOSPITAL_COMMUNITY): Payer: Medicare Other

## 2014-11-15 DIAGNOSIS — I10 Essential (primary) hypertension: Secondary | ICD-10-CM | POA: Insufficient documentation

## 2014-11-15 DIAGNOSIS — R0602 Shortness of breath: Secondary | ICD-10-CM

## 2014-11-15 DIAGNOSIS — Z79899 Other long term (current) drug therapy: Secondary | ICD-10-CM | POA: Diagnosis not present

## 2014-11-15 DIAGNOSIS — J45901 Unspecified asthma with (acute) exacerbation: Secondary | ICD-10-CM | POA: Insufficient documentation

## 2014-11-15 DIAGNOSIS — Z7982 Long term (current) use of aspirin: Secondary | ICD-10-CM | POA: Diagnosis not present

## 2014-11-15 DIAGNOSIS — I5032 Chronic diastolic (congestive) heart failure: Secondary | ICD-10-CM | POA: Diagnosis not present

## 2014-11-15 DIAGNOSIS — F419 Anxiety disorder, unspecified: Secondary | ICD-10-CM | POA: Diagnosis not present

## 2014-11-15 DIAGNOSIS — Z880 Allergy status to penicillin: Secondary | ICD-10-CM | POA: Insufficient documentation

## 2014-11-15 DIAGNOSIS — Z7951 Long term (current) use of inhaled steroids: Secondary | ICD-10-CM | POA: Insufficient documentation

## 2014-11-15 LAB — BASIC METABOLIC PANEL
Anion gap: 4 — ABNORMAL LOW (ref 5–15)
BUN: 8 mg/dL (ref 6–23)
CO2: 29 mmol/L (ref 19–32)
Calcium: 8.8 mg/dL (ref 8.4–10.5)
Chloride: 107 mmol/L (ref 96–112)
Creatinine, Ser: 1.05 mg/dL (ref 0.50–1.10)
GFR calc non Af Amer: 53 mL/min — ABNORMAL LOW (ref >90)
GFR, EST AFRICAN AMERICAN: 61 mL/min — AB (ref >90)
Glucose, Bld: 106 mg/dL — ABNORMAL HIGH (ref 70–99)
Potassium: 3.8 mmol/L (ref 3.5–5.1)
Sodium: 140 mmol/L (ref 135–145)

## 2014-11-15 LAB — I-STAT TROPONIN, ED: Troponin i, poc: 0 ng/mL (ref 0.00–0.08)

## 2014-11-15 LAB — CBC WITH DIFFERENTIAL/PLATELET
BASOS ABS: 0 10*3/uL (ref 0.0–0.1)
Basophils Relative: 1 % (ref 0–1)
EOS ABS: 0.4 10*3/uL (ref 0.0–0.7)
Eosinophils Relative: 6 % — ABNORMAL HIGH (ref 0–5)
HEMATOCRIT: 37.4 % (ref 36.0–46.0)
Hemoglobin: 12.6 g/dL (ref 12.0–15.0)
Lymphocytes Relative: 35 % (ref 12–46)
Lymphs Abs: 2.3 10*3/uL (ref 0.7–4.0)
MCH: 30.6 pg (ref 26.0–34.0)
MCHC: 33.7 g/dL (ref 30.0–36.0)
MCV: 90.8 fL (ref 78.0–100.0)
Monocytes Absolute: 0.3 10*3/uL (ref 0.1–1.0)
Monocytes Relative: 5 % (ref 3–12)
Neutro Abs: 3.6 10*3/uL (ref 1.7–7.7)
Neutrophils Relative %: 53 % (ref 43–77)
Platelets: 196 10*3/uL (ref 150–400)
RBC: 4.12 MIL/uL (ref 3.87–5.11)
RDW: 13.2 % (ref 11.5–15.5)
WBC: 6.6 10*3/uL (ref 4.0–10.5)

## 2014-11-15 LAB — BRAIN NATRIURETIC PEPTIDE: B Natriuretic Peptide: 52.4 pg/mL (ref 0.0–100.0)

## 2014-11-15 MED ORDER — IPRATROPIUM-ALBUTEROL 0.5-2.5 (3) MG/3ML IN SOLN
3.0000 mL | Freq: Once | RESPIRATORY_TRACT | Status: AC
  Administered 2014-11-15: 3 mL via RESPIRATORY_TRACT
  Filled 2014-11-15: qty 3

## 2014-11-15 MED ORDER — ALBUTEROL SULFATE HFA 108 (90 BASE) MCG/ACT IN AERS: 2.0000 | INHALATION_SPRAY | RESPIRATORY_TRACT | Status: DC | PRN

## 2014-11-15 MED ORDER — METHYLPREDNISOLONE SODIUM SUCC 125 MG IJ SOLR
125.0000 mg | Freq: Once | INTRAMUSCULAR | Status: AC
  Administered 2014-11-15: 125 mg via INTRAVENOUS
  Filled 2014-11-15: qty 2

## 2014-11-15 MED ORDER — PREDNISONE 20 MG PO TABS: 60.0000 mg | ORAL_TABLET | Freq: Every day | ORAL | Status: DC

## 2014-11-15 MED ORDER — ALBUTEROL (5 MG/ML) CONTINUOUS INHALATION SOLN
10.0000 mg/h | INHALATION_SOLUTION | Freq: Once | RESPIRATORY_TRACT | Status: AC
  Administered 2014-11-15: 10 mg/h via RESPIRATORY_TRACT
  Filled 2014-11-15: qty 20

## 2014-11-15 NOTE — Discharge Instructions (Signed)
Asthma, Acute Bronchospasm °Acute bronchospasm caused by asthma is also referred to as an asthma attack. Bronchospasm means your air passages become narrowed. The narrowing is caused by inflammation and tightening of the muscles in the air tubes (bronchi) in your lungs. This can make it hard to breathe or cause you to wheeze and cough. °CAUSES °Possible triggers are: °· Animal dander from the skin, hair, or feathers of animals. °· Dust mites contained in house dust. °· Cockroaches. °· Pollen from trees or grass. °· Mold. °· Cigarette or tobacco smoke. °· Air pollutants such as dust, household cleaners, hair sprays, aerosol sprays, paint fumes, strong chemicals, or strong odors. °· Cold air or weather changes. Cold air may trigger inflammation. Winds increase molds and pollens in the air. °· Strong emotions such as crying or laughing hard. °· Stress. °· Certain medicines such as aspirin or beta-blockers. °· Sulfites in foods and drinks, such as dried fruits and wine. °· Infections or inflammatory conditions, such as a flu, cold, or inflammation of the nasal membranes (rhinitis). °· Gastroesophageal reflux disease (GERD). GERD is a condition where stomach acid backs up into your esophagus. °· Exercise or strenuous activity. °SIGNS AND SYMPTOMS  °· Wheezing. °· Excessive coughing, particularly at night. °· Chest tightness. °· Shortness of breath. °DIAGNOSIS  °Your health care provider will ask you about your medical history and perform a physical exam. A chest X-ray or blood testing may be performed to look for other causes of your symptoms or other conditions that may have triggered your asthma attack.  °TREATMENT  °Treatment is aimed at reducing inflammation and opening up the airways in your lungs.  Most asthma attacks are treated with inhaled medicines. These include quick relief or rescue medicines (such as bronchodilators) and controller medicines (such as inhaled corticosteroids). These medicines are sometimes  given through an inhaler or a nebulizer. Systemic steroid medicine taken by mouth or given through an IV tube also can be used to reduce the inflammation when an attack is moderate or severe. Antibiotic medicines are only used if a bacterial infection is present.  °HOME CARE INSTRUCTIONS  °· Rest. °· Drink plenty of liquids. This helps the mucus to remain thin and be easily coughed up. Only use caffeine in moderation and do not use alcohol until you have recovered from your illness. °· Do not smoke. Avoid being exposed to secondhand smoke. °· You play a critical role in keeping yourself in good health. Avoid exposure to things that cause you to wheeze or to have breathing problems. °· Keep your medicines up-to-date and available. Carefully follow your health care provider's treatment plan. °· Take your medicine exactly as prescribed. °· When pollen or pollution is bad, keep windows closed and use an air conditioner or go to places with air conditioning. °· Asthma requires careful medical care. See your health care provider for a follow-up as advised. If you are more than [redacted] weeks pregnant and you were prescribed any new medicines, let your obstetrician know about the visit and how you are doing. Follow up with your health care provider as directed. °· After you have recovered from your asthma attack, make an appointment with your outpatient doctor to talk about ways to reduce the likelihood of future attacks. If you do not have a doctor who manages your asthma, make an appointment with a primary care doctor to discuss your asthma. °SEEK IMMEDIATE MEDICAL CARE IF:  °· You are getting worse. °· You have trouble breathing. If severe, call your local   emergency services (911 in the [place] °· You develop chest pain or discomfort. °· You are vomiting. °· You are not able to keep fluids down. °· You are coughing up yellow, green, brown, or bloody sputum. °· You have a fever and your symptoms suddenly get worse. °· You have  trouble swallowing. °MAKE SURE YOU:  °· Understand these instructions. °· Will watch your condition. °· Will get help right away if you are not doing well or get worse. °Document Released: 11/13/2006 Document Revised: 08/03/2013 Document Reviewed: 02/03/2013 °[employer]® Patient Information ©2015 [employer]. This information is not intended to replace advice given to you by your health care provider. Make sure you discuss any questions you have with your health care provider. ° °Asthma Attack Prevention °Although there is no way to prevent asthma from starting, you can take steps to control the disease and reduce its symptoms. Learn about your asthma and how to control it. Take an active role to control your asthma by working with your health care provider to create and follow an asthma action plan. An asthma action plan guides you in: °· Taking your medicines properly. °· Avoiding things that set off your asthma or make your asthma worse (asthma triggers). °· Tracking your level of asthma control. °· Responding to worsening asthma. °· Seeking emergency care when needed. °To track your asthma, keep records of your symptoms, check your peak flow number using a handheld device that shows how well air moves out of your lungs (peak flow meter), and get regular asthma checkups.  °WHAT ARE SOME WAYS TO PREVENT AN ASTHMA ATTACK? °· Take medicines as directed by your health care provider. °· Keep track of your asthma symptoms and level of control. °· With your health care provider, write a detailed plan for taking medicines and managing an asthma attack. Then be sure to follow your action plan. Asthma is an ongoing condition that needs regular monitoring and treatment. °· Identify and avoid asthma triggers. Many outdoor allergens and irritants (such as pollen, mold, cold air, and air pollution) can trigger asthma attacks. Find out what your asthma triggers are and take steps to avoid them. °· Monitor your breathing. Learn  to recognize warning signs of an attack, such as coughing, wheezing, or shortness of breath. Your lung function may decrease before you notice any signs or symptoms, so regularly measure and record your peak airflow with a home peak flow meter. °· Identify and treat attacks early. If you act quickly, you are less likely to have a severe attack. You will also need less medicine to control your symptoms. When your peak flow measurements decrease and alert you to an upcoming attack, take your medicine as instructed and immediately stop any activity that may have triggered the attack. If your symptoms do not improve, get medical help. °· Pay attention to increasing quick-relief inhaler use. If you find yourself relying on your quick-relief inhaler, your asthma is not under control. See your health care provider about adjusting your treatment. °WHAT CAN MAKE MY SYMPTOMS WORSE? °A number of common things can set off or make your asthma symptoms worse and cause temporary increased inflammation of your airways. Keep track of your asthma symptoms for several weeks, detailing all the environmental and emotional factors that are linked with your asthma. When you have an asthma attack, go back to your asthma diary to see which factor, or combination of factors, might have contributed to it. Once you know what these factors are, you can   take steps to control many of them. If you have allergies and asthma, it is important to take asthma prevention steps at home. Minimizing contact with the substance to which you are allergic will help prevent an asthma attack. Some triggers and ways to avoid these triggers are: °Animal Dander:  °Some people are allergic to the flakes of skin or dried saliva from animals with fur or feathers.  °· There is no such thing as a hypoallergenic dog or cat breed. All dogs or cats can cause allergies, even if they don't shed. °· Keep these pets out of your home. °· If you are not able to keep a pet  outdoors, keep the pet out of your bedroom and other sleeping areas at all times, and keep the door closed. °· Remove carpets and furniture covered with cloth from your home. If that is not possible, keep the pet away from fabric-covered furniture and carpets. °Dust Mites: °Many people with asthma are allergic to dust mites. Dust mites are tiny bugs that are found in every home in mattresses, pillows, carpets, fabric-covered furniture, bedcovers, clothes, stuffed toys, and other fabric-covered items.  °· Cover your mattress in a special dust-proof cover. °· Cover your pillow in a special dust-proof cover, or wash the pillow each week in hot water. Water must be hotter than 130° F (54.4° C) to kill dust mites. Cold or warm water used with detergent and bleach can also be effective. °· Wash the sheets and blankets on your bed each week in hot water. °· Try not to sleep or lie on cloth-covered cushions. °· Call ahead when traveling and ask for a smoke-free hotel room. Bring your own bedding and pillows in case the hotel only supplies feather pillows and down comforters, which may contain dust mites and cause asthma symptoms. °· Remove carpets from your bedroom and those laid on concrete, if you can. °· Keep stuffed toys out of the bed, or wash the toys weekly in hot water or cooler water with detergent and bleach. °Cockroaches: °Many people with asthma are allergic to the droppings and remains of cockroaches.  °· Keep food and garbage in closed containers. Never leave food out. °· Use poison baits, traps, powders, gels, or paste (for example, boric acid). °· If a spray is used to kill cockroaches, stay out of the room until the odor goes away. °Indoor Mold: °· Fix leaky faucets, pipes, or other sources of water that have mold around them. °· Clean floors and moldy surfaces with a fungicide or diluted bleach. °· Avoid using humidifiers, vaporizers, or swamp coolers. These can spread molds through the air. °Pollen and  Outdoor Mold: °· When pollen or mold spore counts are high, try to keep your windows closed. °· Stay indoors with windows closed from late morning to afternoon. Pollen and some mold spore counts are highest at that time. °· Ask your health care provider whether you need to take anti-inflammatory medicine or increase your dose of the medicine before your allergy season starts. °Other Irritants to Avoid: °· Tobacco smoke is an irritant. If you smoke, ask your health care provider how you can quit. Ask family members to quit smoking, too. Do not allow smoking in your home or car. °· If possible, do not use a wood-burning stove, kerosene heater, or fireplace. Minimize exposure to all sources of smoke, including incense, candles, fires, and fireworks. °· Try to stay away from strong odors and sprays, such as perfume, talcum powder, hair spray, and   paints. °· Decrease humidity in your home and use an indoor air cleaning device. Reduce indoor humidity to below 60%. Dehumidifiers or central air conditioners can do this. °· Decrease house dust exposure by changing furnace and air cooler filters frequently. °· Try to have someone else vacuum for you once or twice a week. Stay out of rooms while they are being vacuumed and for a short while afterward. °· If you vacuum, use a dust mask from a hardware store, a double-layered or microfilter vacuum cleaner bag, or a vacuum cleaner with a HEPA filter. °· Sulfites in foods and beverages can be irritants. Do not drink beer or wine or eat dried fruit, processed potatoes, or shrimp if they cause asthma symptoms. °· Cold air can trigger an asthma attack. Cover your nose and mouth with a scarf on cold or windy days. °· Several health conditions can make asthma more difficult to manage, including a runny nose, sinus infections, reflux disease, psychological stress, and sleep apnea. Work with your health care provider to manage these conditions. °· Avoid close contact with people who have  a respiratory infection such as a cold or the flu, since your asthma symptoms may get worse if you catch the infection. Wash your hands thoroughly after touching items that may have been handled by people with a respiratory infection. °· Get a flu shot every year to protect against the flu virus, which often makes asthma worse for days or weeks. Also get a pneumonia shot if you have not previously had one. Unlike the flu shot, the pneumonia shot does not need to be given yearly. °Medicines: °· Talk to your health care provider about whether it is safe for you to take aspirin or non-steroidal anti-inflammatory medicines (NSAIDs). In a small number of people with asthma, aspirin and NSAIDs can cause asthma attacks. These medicines must be avoided by people who have known aspirin-sensitive asthma. It is important that people with aspirin-sensitive asthma read labels of all over-the-counter medicines used to treat pain, colds, coughs, and fever. °· Beta-blockers and ACE inhibitors are other medicines you should discuss with your health care provider. °HOW CAN I FIND OUT WHAT I AM ALLERGIC TO? °Ask your asthma health care provider about allergy skin testing or blood testing (the RAST test) to identify the allergens to which you are sensitive. If you are found to have allergies, the most important thing to do is to try to avoid exposure to any allergens that you are sensitive to as much as possible. Other treatments for allergies, such as medicines and allergy shots (immunotherapy) are available.  °CAN I EXERCISE? °Follow your health care provider's advice regarding asthma treatment before exercising. It is important to maintain a regular exercise program, but vigorous exercise or exercise in cold, humid, or dry environments can cause asthma attacks, especially for those people who have exercise-induced asthma. °Document Released: 07/17/2009 Document Revised: 08/03/2013 Document Reviewed: 02/03/2013 °[employer]® Patient  Information ©2015 [employer]. This information is not intended to replace advice given to you by your health care provider. Make sure you discuss any questions you have with your health care provider. ° °

## 2014-11-15 NOTE — ED Notes (Signed)
Pt ambulates well. Pulse ox sats at 100% while ambulating to restroom. No distress noted.

## 2014-11-15 NOTE — ED Notes (Signed)
Pt sts mid chest pain and SOB; pt hyperventilating; pt noted to have some tightness; pt sts pain worse with cough and inspiration

## 2014-11-15 NOTE — ED Provider Notes (Signed)
TIME SEEN: 10:25 AM  CHIEF COMPLAINT: chest pain, shortness of breath  HPI: Pt is a 69 y.o. female with history of hypertension, CHF, asthma who presents to the emergency department with complaints of chest tightness, shortness of breath, wheezing that started at 10 AM. Reports that her asthma is normally triggered by changes in season. She describes the pain as pressure, tightness diffusely across her anterior chest without radiation. No aggravating or relieving factors. Has had a dry cough but no fever. No lower extremity swelling or pain. No prior history of PE or DVT.  ROS: See HPI Constitutional: no fever  Eyes: no drainage  ENT: no runny nose   Cardiovascular:   chest pain  Resp:  SOB  GI: no vomiting GU: no dysuria Integumentary: no rash  Allergy: no hives  Musculoskeletal: no leg swelling  Neurological: no slurred speech ROS otherwise negative  PAST MEDICAL HISTORY/PAST SURGICAL HISTORY:  Past Medical History  Diagnosis Date  . Hypertension   . Asthma   . Paresthesia 01/27/2012  . Chronic diastolic congestive heart failure 01/29/2012  . Anxiety 01/25/2012    MEDICATIONS:  Prior to Admission medications   Medication Sig Start Date End Date Taking? Authorizing Provider  albuterol (PROVENTIL HFA;VENTOLIN HFA) 108 (90 BASE) MCG/ACT inhaler Inhale 2 puffs into the lungs every 6 (six) hours as needed for wheezing or shortness of breath.     Historical Provider, MD  arformoterol (BROVANA) 15 MCG/2ML NEBU Take 15 mcg by nebulization 2 (two) times daily.     Historical Provider, MD  aspirin EC 81 MG tablet Take 81 mg by mouth every evening.    Historical Provider, MD  benzonatate (TESSALON) 100 MG capsule Take 100 mg by mouth 3 (three) times daily as needed for cough.    Historical Provider, MD  budesonide (PULMICORT) 0.5 MG/2ML nebulizer solution Take 0.5 mg by nebulization 2 (two) times daily.     Historical Provider, MD  Cholecalciferol ([employer] VIT D 5000 HIGH-POTENCY PO) Take 5,000  Units by mouth daily.    Historical Provider, MD  citalopram (CELEXA) 40 MG tablet Take 40 mg by mouth daily.    Historical Provider, MD  cyclobenzaprine (FLEXERIL) 10 MG tablet Take 10 mg by mouth 3 (three) times daily as needed. For muscle spasms    Historical Provider, MD  diazepam (VALIUM) 5 MG tablet Take 2.5 mg by mouth daily as needed for anxiety. To relax    Historical Provider, MD  Eszopiclone (ESZOPICLONE) 3 MG TABS Take 3 mg by mouth at bedtime as needed (insomnia). Take immediately before bedtime    Historical Provider, MD  hydrOXYzine (ATARAX/VISTARIL) 10 MG tablet Take 10 mg by mouth daily as needed for itching.     Historical Provider, MD  nebivolol (BYSTOLIC) 10 MG tablet Take 20 mg by mouth daily.     Historical Provider, MD  pantoprazole (PROTONIX) 40 MG tablet Take 40 mg by mouth daily.    Historical Provider, MD  PARoxetine Mesylate (BRISDELLE) 7.5 MG CAPS Take 7.5 mg by mouth at bedtime. 03/16/14   Brock Bad, MD  polyvinyl alcohol (LIQUIFILM TEARS) 1.4 % ophthalmic solution Place 1 drop into both eyes daily.    Historical Provider, MD  potassium chloride SA (K-DUR,KLOR-CON) 20 MEQ tablet Take 20 mEq by mouth daily.    Historical Provider, MD  promethazine-dextromethorphan (PROMETHAZINE-DM) 6.25-15 MG/5ML syrup Take 5 mLs by mouth 4 (four) times daily as needed for cough.    Historical Provider, MD  traMADol (ULTRAM) 50 MG  tablet Take 1 tablet (50 mg total) by mouth every 6 (six) hours as needed for moderate pain. 05/17/14   Shon Batonourtney F Horton, MD  vitamin C (ASCORBIC ACID) 500 MG tablet Take 500 mg by mouth daily.    Historical Provider, MD    ALLERGIES:  Allergies  Allergen Reactions  . Azithromycin Shortness Of Breath and Swelling  . Levofloxacin Shortness Of Breath  . Meloxicam Shortness Of Breath  . Penicillins Anaphylaxis  . Codeine     REACTION: tachycardia  . Naproxen Nausea And Vomiting    SOCIAL HISTORY:  History  Substance Use Topics  . Smoking status:  Never Smoker   . Smokeless tobacco: Never Used  . Alcohol Use: Yes     Comment: Socially     FAMILY HISTORY: Family History  Problem Relation Age of Onset  . Asthma Neg Hx   . Allergies Neg Hx   . Heart attack Father [id number]    EXAM: BP 103/85 mmHg  Pulse 80  Temp(Src) 97.2 F (36.2 C)  Resp 26  SpO2 98% CONSTITUTIONAL: Alert and oriented and responds appropriately to questions. Mild to moderate respiratory distress, nontoxic HEAD: Normocephalic EYES: Conjunctivae clear, PERRL ENT: normal nose; no rhinorrhea; moist mucous membranes; pharynx without lesions noted NECK: Supple, no meningismus, no LAD  CARD: RRR; S1 and S2 appreciated; no murmurs, no clicks, no rubs, no gallops RESP: Normal chest excursion without splinting, patient has mild to In speaking short sentences, no hypoxia, diminished aeration diffusely with expiratory wheezing, no rhonchi or rales ABD/GI: Normal bowel sounds; non-distended; soft, non-tender, no rebound, no guarding BACK:  The back appears normal and is non-tender to palpation, there is no CVA tenderness EXT: Normal ROM in all joints; non-tender to palpation; no edema; normal capillary refill; no cyanosis, no calf tenderness or swelling    SKIN: Normal color for age and race; warm NEURO: Moves all extremities equally PSYCH: The patient's mood and manner are appropriate. Grooming and personal hygiene are appropriate.  MEDICAL DECISION MAKING: Patient here with what appears to be an asthma exacerbation. She is tachypneic, speaking short sentences, has diminished aeration and diffuse expiratory wheezing. We'll give continuous albuterol, Solu-Medrol. We'll also obtain cardiac labs including troponin and BNP. We'll also obtain portable chest x-ray.  ED PROGRESS: Patient is now able to speak full sentences and no longer has tachypnea. Reports her symptoms have markedly improved after continuous albuterol and Solu-Medrol. Her labs are unremarkable including normal  BNP and normal troponin. Chest x-ray is clear. She still has very mild expiratory wheezing. We'll give DuoNeb and then ambulate patient with pulse ox. Anticipate if she is still doing well but we will discharge her home. She is comfortable with this plan.    Lungs sound clear after DuoNeb treatment. She reports feeling much better. No hypoxia with ambulation. We'll discharge with prescription for albuterol inhaler and steroid burst. Discussed return precautions and importance of PCP follow-up. She verbalized understanding and is comfortable with plan.     EKG Interpretation  Date/Time:  Tuesday November 15 2014 10:13:31 EDT Ventricular Rate:  87 PR Interval:  156 QRS Duration: 60 QT Interval:  372 QTC Calculation: 447 R Axis:   86 Text Interpretation:  Normal sinus rhythm Nonspecific ST abnormality Abnormal ECG No significant change since last tracing Confirmed by Korie Streat,  DO, Trinia Georgi ([id number]) on 11/15/2014 10:23:02 AM          CRITICAL CARE Performed by: Raelyn NumberWARD, Ethell Blatchford N   Total critical care time: 45 min  Critical care time was exclusive of separately billable procedures and treating other patients.  Critical care was necessary to treat or prevent imminent or life-threatening deterioration.  Critical care was time spent personally by me on the following activities: development of treatment plan with patient and/or surrogate as well as nursing, discussions with consultants, evaluation of patient's response to treatment, examination of patient, obtaining history from patient or surrogate, ordering and performing treatments and interventions, ordering and review of laboratory studies, ordering and review of radiographic studies, pulse oximetry and re-evaluation of patient's condition.   Layla Maw Moksh Loomer, DO 11/15/14 [id number]

## 2015-03-16 ENCOUNTER — Ambulatory Visit: Payer: Medicare Other | Admitting: Obstetrics

## 2015-07-25 ENCOUNTER — Ambulatory Visit (INDEPENDENT_AMBULATORY_CARE_PROVIDER_SITE_OTHER): Payer: Medicare Other | Admitting: Obstetrics

## 2015-07-25 ENCOUNTER — Encounter: Payer: Self-pay | Admitting: Obstetrics

## 2015-07-25 VITALS — BP 127/68 | HR 73 | Ht 61.0 in | Wt 161.0 lb

## 2015-07-25 DIAGNOSIS — F411 Generalized anxiety disorder: Secondary | ICD-10-CM

## 2015-07-25 DIAGNOSIS — Z01419 Encounter for gynecological examination (general) (routine) without abnormal findings: Secondary | ICD-10-CM

## 2015-07-25 DIAGNOSIS — G4700 Insomnia, unspecified: Secondary | ICD-10-CM

## 2015-07-25 DIAGNOSIS — Z780 Asymptomatic menopausal state: Secondary | ICD-10-CM

## 2015-07-25 MED ORDER — ALPRAZOLAM 0.5 MG PO TABS: 0.5000 mg | ORAL_TABLET | Freq: Every evening | ORAL | Status: DC | PRN

## 2015-07-25 NOTE — Progress Notes (Signed)
Patient ID: Renee Adkins, female   DOB: 1945/10/13, 69 y.o.   MRN: [id number]  Chief Complaint  Patient presents with  . Gynecologic Exam    HPI Renee Adkins is a 69 y.o. female.  Continued insomnia, hot flashes and situational anxiety over an educational disappointment.  Patient's school was shut down just months before she was to be granted a degree.  Medicine prescribed previously does not work for hot flashes and insomnia. HPI  Past Medical History  Diagnosis Date  . Hypertension   . Asthma   . Paresthesia 01/27/2012  . Chronic diastolic congestive heart failure (HCC) 01/29/2012  . Anxiety 01/25/2012    Past Surgical History  Procedure Laterality Date  . Vesicovaginal fistula closure w/ tah    . Total knee arthroplasty  Jan 2011    rt knee    Family History  Problem Relation Age of Onset  . Asthma Neg Hx   . Allergies Neg Hx   . Heart attack Father 85    Social History Social History  Substance Use Topics  . Smoking status: Never Smoker   . Smokeless tobacco: Never Used  . Alcohol Use: 0.0 oz/week    0 Standard drinks or equivalent per week     Comment: Socially     Allergies  Allergen Reactions  . Azithromycin Shortness Of Breath and Swelling  . Levofloxacin Shortness Of Breath  . Meloxicam Shortness Of Breath  . Penicillins Anaphylaxis  . Codeine     REACTION: tachycardia  . Naproxen Nausea And Vomiting    Current Outpatient Prescriptions  Medication Sig Dispense Refill  . albuterol (PROVENTIL HFA;VENTOLIN HFA) 108 (90 BASE) MCG/ACT inhaler Inhale 2 puffs into the lungs every 6 (six) hours as needed for wheezing or shortness of breath.     Marland Kitchen albuterol (PROVENTIL HFA;VENTOLIN HFA) 108 (90 BASE) MCG/ACT inhaler Inhale 2-4 puffs into the lungs every 4 (four) hours as needed for wheezing or shortness of breath. 1 Inhaler 0  . arformoterol (BROVANA) 15 MCG/2ML NEBU Take 15 mcg by nebulization 2 (two) times daily.     Marland Kitchen aspirin EC 81 MG tablet Take 81 mg  by mouth every evening.    . benzonatate (TESSALON) 100 MG capsule Take 100 mg by mouth 3 (three) times daily as needed for cough.    . budesonide (PULMICORT) 0.5 MG/2ML nebulizer solution Take 0.5 mg by nebulization 2 (two) times daily.     . Cholecalciferol ([employer] VIT D 5000 HIGH-POTENCY PO) Take 5,000 Units by mouth daily.    . citalopram (CELEXA) 40 MG tablet Take 40 mg by mouth daily.    . cyclobenzaprine (FLEXERIL) 10 MG tablet Take 10 mg by mouth 3 (three) times daily as needed. For muscle spasms    . diazepam (VALIUM) 5 MG tablet Take 2.5 mg by mouth daily as needed for anxiety. To relax    . Eszopiclone (ESZOPICLONE) 3 MG TABS Take 3 mg by mouth at bedtime as needed (insomnia). Take immediately before bedtime    . hydrOXYzine (ATARAX/VISTARIL) 10 MG tablet Take 10 mg by mouth daily as needed for itching.     . nebivolol (BYSTOLIC) 10 MG tablet Take 20 mg by mouth daily.     . pantoprazole (PROTONIX) 40 MG tablet Take 40 mg by mouth daily.    Marland Kitchen PARoxetine Mesylate (BRISDELLE) 7.5 MG CAPS Take 7.5 mg by mouth at bedtime. 30 capsule 11  . potassium chloride SA (K-DUR,KLOR-CON) 20 MEQ tablet Take 20 mEq by  mouth daily.    . traMADol (ULTRAM) 50 MG tablet Take 1 tablet (50 mg total) by mouth every 6 (six) hours as needed for moderate pain. 20 tablet 0  . vitamin C (ASCORBIC ACID) 500 MG tablet Take 500 mg by mouth daily.    Marland Kitchen ALPRAZolam (XANAX) 0.5 MG tablet Take 1 tablet (0.5 mg total) by mouth at bedtime as needed for anxiety. 30 tablet 3  . polyvinyl alcohol (LIQUIFILM TEARS) 1.4 % ophthalmic solution Place 1 drop into both eyes daily.    . predniSONE (DELTASONE) 20 MG tablet Take 3 tablets (60 mg total) by mouth daily. (Patient not taking: Reported on 07/25/2015) 15 tablet 0  . promethazine-dextromethorphan (PROMETHAZINE-DM) 6.25-15 MG/5ML syrup Take 5 mLs by mouth 4 (four) times daily as needed for cough.     No current facility-administered medications for this visit.    Review of  Systems Review of Systems Constitutional: negative for fatigue and weight loss Respiratory: negative for cough and wheezing Cardiovascular: negative for chest pain, fatigue and palpitations Gastrointestinal: negative for abdominal pain and change in bowel habits Genitourinary:negative Integument/breast: negative for nipple discharge Musculoskeletal:negative for myalgias Neurological: negative for gait problems and tremors Behavioral/Psych: negative for abusive relationship, depression.  Positive for anxiety / insomnia. Endocrine: positive for hot flushes    Blood pressure 127/68, pulse 73, height 5\' 1"  (1.549 m), weight 161 lb (73.029 kg).  Physical Exam Physical Exam General:   alert  Skin:   no rash or abnormalities  Lungs:   clear to auscultation bilaterally  Heart:   regular rate and rhythm, S1, S2 normal, no murmur, click, rub or gallop  Breasts:   normal without suspicious masses, skin or nipple changes or axillary nodes  Abdomen:  normal findings: no organomegaly, soft, non-tender and no hernia  Pelvis:  External genitalia: normal general appearance Urinary system: urethral meatus normal and bladder without fullness, nontender Vaginal: normal without tenderness, induration or masses Cervix: normal appearance Adnexa: normal bimanual exam Uterus: anteverted and non-tender, normal size      Data Reviewed Labs Mammogram  Assessment     Annual exam.  Doing well.  Menopausal symptoms Anxiety / Insomnia - situational    Plan    Xanax Rx  F/U in 1 year.  Orders Placed This Encounter  Procedures  . SureSwab, Vaginosis/Vaginitis Plus   Meds ordered this encounter  Medications  . ALPRAZolam (XANAX) 0.5 MG tablet    Sig: Take 1 tablet (0.5 mg total) by mouth at bedtime as needed for anxiety.    Dispense:  30 tablet    Refill:  3

## 2015-07-26 ENCOUNTER — Telehealth: Payer: Self-pay | Admitting: *Deleted

## 2015-07-26 ENCOUNTER — Encounter: Payer: Self-pay | Admitting: *Deleted

## 2015-07-26 ENCOUNTER — Encounter: Payer: Self-pay | Admitting: Obstetrics

## 2015-07-26 NOTE — Telephone Encounter (Signed)
Patient is calling to let Dr Clearance CootsHarper know that she had a colonoscopy 03/21/2009- follow up in 5-10 years and a bone density in 11/2014- normal. Patient called and canceled her consult that she had next week. She will call Dr Loreta AveMann in August 2017 for an appointment.

## 2015-07-28 LAB — SURESWAB, VAGINOSIS/VAGINITIS PLUS
ATOPOBIUM VAGINAE: NOT DETECTED Log (cells/mL)
C. TROPICALIS, DNA: NOT DETECTED
C. albicans, DNA: NOT DETECTED
C. glabrata, DNA: DETECTED — AB
C. parapsilosis, DNA: NOT DETECTED
C. trachomatis RNA, TMA: NOT DETECTED
GARDNERELLA VAGINALIS: 7.7 Log (cells/mL)
LACTOBACILLUS SPECIES: NOT DETECTED Log (cells/mL)
MEGASPHAERA SPECIES: NOT DETECTED Log (cells/mL)
N. gonorrhoeae RNA, TMA: NOT DETECTED
T. vaginalis RNA, QL TMA: NOT DETECTED

## 2015-07-29 ENCOUNTER — Other Ambulatory Visit: Payer: Self-pay | Admitting: Obstetrics

## 2015-07-29 DIAGNOSIS — B9689 Other specified bacterial agents as the cause of diseases classified elsewhere: Secondary | ICD-10-CM

## 2015-07-29 DIAGNOSIS — N760 Acute vaginitis: Principal | ICD-10-CM

## 2015-07-29 DIAGNOSIS — B3731 Acute candidiasis of vulva and vagina: Secondary | ICD-10-CM

## 2015-07-29 DIAGNOSIS — B373 Candidiasis of vulva and vagina: Secondary | ICD-10-CM

## 2015-07-29 MED ORDER — FLUCONAZOLE 150 MG PO TABS: 150.0000 mg | ORAL_TABLET | Freq: Once | ORAL | Status: DC

## 2015-07-29 MED ORDER — TINIDAZOLE 500 MG PO TABS: 1000.0000 mg | ORAL_TABLET | Freq: Every day | ORAL | Status: DC

## 2015-08-01 ENCOUNTER — Other Ambulatory Visit: Payer: Self-pay | Admitting: *Deleted

## 2015-08-01 DIAGNOSIS — N760 Acute vaginitis: Principal | ICD-10-CM

## 2015-08-01 DIAGNOSIS — B9689 Other specified bacterial agents as the cause of diseases classified elsewhere: Secondary | ICD-10-CM

## 2015-08-01 MED ORDER — METRONIDAZOLE 500 MG PO TABS: 500.0000 mg | ORAL_TABLET | Freq: Two times a day (BID) | ORAL | Status: DC

## 2015-08-01 NOTE — Progress Notes (Signed)
Pt made aware of lab results.  [facility]

## 2015-08-02 ENCOUNTER — Other Ambulatory Visit: Payer: Self-pay | Admitting: *Deleted

## 2016-04-11 ENCOUNTER — Telehealth: Payer: Self-pay | Admitting: *Deleted

## 2016-04-11 NOTE — Telephone Encounter (Signed)
Patient wants a prescription for Xanax for her nerves and her disability forms filled out for chronic leg and back pain.Explained to patient that she needs to have her PCP handle this for her,and she stated that is what she will do.

## 2016-04-20 ENCOUNTER — Observation Stay (HOSPITAL_COMMUNITY)
Admission: EM | Admit: 2016-04-20 | Discharge: 2016-04-21 | Disposition: A | Discharge status: Home / Self Care | Payer: Medicare Other | Attending: Internal Medicine | Admitting: Internal Medicine

## 2016-04-20 ENCOUNTER — Observation Stay (HOSPITAL_COMMUNITY): Payer: Medicare Other

## 2016-04-20 ENCOUNTER — Emergency Department (HOSPITAL_COMMUNITY): Payer: Medicare Other

## 2016-04-20 ENCOUNTER — Encounter (HOSPITAL_COMMUNITY): Payer: Self-pay

## 2016-04-20 DIAGNOSIS — Z79899 Other long term (current) drug therapy: Secondary | ICD-10-CM | POA: Insufficient documentation

## 2016-04-20 DIAGNOSIS — E876 Hypokalemia: Secondary | ICD-10-CM | POA: Diagnosis not present

## 2016-04-20 DIAGNOSIS — K219 Gastro-esophageal reflux disease without esophagitis: Secondary | ICD-10-CM | POA: Insufficient documentation

## 2016-04-20 DIAGNOSIS — I5032 Chronic diastolic (congestive) heart failure: Secondary | ICD-10-CM | POA: Diagnosis not present

## 2016-04-20 DIAGNOSIS — I110 Hypertensive heart disease with heart failure: Secondary | ICD-10-CM | POA: Insufficient documentation

## 2016-04-20 DIAGNOSIS — Z7982 Long term (current) use of aspirin: Secondary | ICD-10-CM | POA: Diagnosis not present

## 2016-04-20 DIAGNOSIS — Y92410 Unspecified street and highway as the place of occurrence of the external cause: Secondary | ICD-10-CM | POA: Insufficient documentation

## 2016-04-20 DIAGNOSIS — M545 Low back pain: Secondary | ICD-10-CM | POA: Insufficient documentation

## 2016-04-20 DIAGNOSIS — F411 Generalized anxiety disorder: Secondary | ICD-10-CM | POA: Diagnosis not present

## 2016-04-20 DIAGNOSIS — J45909 Unspecified asthma, uncomplicated: Secondary | ICD-10-CM | POA: Diagnosis present

## 2016-04-20 DIAGNOSIS — I10 Essential (primary) hypertension: Secondary | ICD-10-CM | POA: Diagnosis present

## 2016-04-20 DIAGNOSIS — R413 Other amnesia: Secondary | ICD-10-CM | POA: Diagnosis not present

## 2016-04-20 DIAGNOSIS — R4182 Altered mental status, unspecified: Principal | ICD-10-CM | POA: Diagnosis present

## 2016-04-20 DIAGNOSIS — N390 Urinary tract infection, site not specified: Secondary | ICD-10-CM | POA: Diagnosis present

## 2016-04-20 DIAGNOSIS — Z96651 Presence of right artificial knee joint: Secondary | ICD-10-CM | POA: Diagnosis not present

## 2016-04-20 DIAGNOSIS — Z7951 Long term (current) use of inhaled steroids: Secondary | ICD-10-CM | POA: Diagnosis not present

## 2016-04-20 DIAGNOSIS — R079 Chest pain, unspecified: Secondary | ICD-10-CM | POA: Diagnosis not present

## 2016-04-20 DIAGNOSIS — M549 Dorsalgia, unspecified: Secondary | ICD-10-CM

## 2016-04-20 DIAGNOSIS — R569 Unspecified convulsions: Secondary | ICD-10-CM

## 2016-04-20 HISTORY — DX: Unspecified convulsions: R56.9

## 2016-04-20 HISTORY — DX: Disease of pericardium, unspecified: I31.9

## 2016-04-20 LAB — COMPREHENSIVE METABOLIC PANEL
ALT: 11 U/L — AB (ref 14–54)
AST: 17 U/L (ref 15–41)
Albumin: 4 g/dL (ref 3.5–5.0)
Alkaline Phosphatase: 39 U/L (ref 38–126)
Anion gap: 10 (ref 5–15)
BILIRUBIN TOTAL: 0.7 mg/dL (ref 0.3–1.2)
BUN: 13 mg/dL (ref 6–20)
CO2: 23 mmol/L (ref 22–32)
CREATININE: 0.91 mg/dL (ref 0.44–1.00)
Calcium: 8.8 mg/dL — ABNORMAL LOW (ref 8.9–10.3)
Chloride: 106 mmol/L (ref 101–111)
GFR calc Af Amer: >60 mL/min (ref >60)
GLUCOSE: 84 mg/dL (ref 65–99)
Potassium: 3.4 mmol/L — ABNORMAL LOW (ref 3.5–5.1)
Sodium: 139 mmol/L (ref 135–145)
TOTAL PROTEIN: 6.2 g/dL — AB (ref 6.5–8.1)

## 2016-04-20 LAB — CBC WITH DIFFERENTIAL/PLATELET
BASOS ABS: 0 10*3/uL (ref 0.0–0.1)
Basophils Relative: 0 %
Eosinophils Absolute: 0.4 10*3/uL (ref 0.0–0.7)
Eosinophils Relative: 6 %
HEMATOCRIT: 37.4 % (ref 36.0–46.0)
HEMOGLOBIN: 12.5 g/dL (ref 12.0–15.0)
LYMPHS PCT: 35 %
Lymphs Abs: 2.5 10*3/uL (ref 0.7–4.0)
MCH: 30.6 pg (ref 26.0–34.0)
MCHC: 33.4 g/dL (ref 30.0–36.0)
MCV: 91.4 fL (ref 78.0–100.0)
Monocytes Absolute: 0.4 10*3/uL (ref 0.1–1.0)
Monocytes Relative: 6 %
NEUTROS ABS: 3.8 10*3/uL (ref 1.7–7.7)
NEUTROS PCT: 53 %
Platelets: 147 10*3/uL — ABNORMAL LOW (ref 150–400)
RBC: 4.09 MIL/uL (ref 3.87–5.11)
RDW: 13 % (ref 11.5–15.5)
WBC: 7.2 10*3/uL (ref 4.0–10.5)

## 2016-04-20 LAB — URINALYSIS, ROUTINE W REFLEX MICROSCOPIC
BILIRUBIN URINE: NEGATIVE
GLUCOSE, UA: NEGATIVE mg/dL
HGB URINE DIPSTICK: NEGATIVE
Ketones, ur: NEGATIVE mg/dL
Nitrite: POSITIVE — AB
Protein, ur: NEGATIVE mg/dL
SPECIFIC GRAVITY, URINE: 1.019 (ref 1.005–1.030)
pH: 6.5 (ref 5.0–8.0)

## 2016-04-20 LAB — TROPONIN I: Troponin I: <0.03 ng/mL (ref <0.03)

## 2016-04-20 LAB — URINE MICROSCOPIC-ADD ON: RBC / HPF: NONE SEEN RBC/hpf (ref 0–5)

## 2016-04-20 LAB — BRAIN NATRIURETIC PEPTIDE: B Natriuretic Peptide: 38.1 pg/mL (ref 0.0–100.0)

## 2016-04-20 LAB — I-STAT CG4 LACTIC ACID, ED: LACTIC ACID, VENOUS: 0.84 mmol/L (ref 0.5–1.9)

## 2016-04-20 LAB — TSH: TSH: 2.201 u[IU]/mL (ref 0.350–4.500)

## 2016-04-20 LAB — CBG MONITORING, ED: GLUCOSE-CAPILLARY: 87 mg/dL (ref 65–99)

## 2016-04-20 LAB — VITAMIN B12: VITAMIN B 12: 836 pg/mL (ref 180–914)

## 2016-04-20 LAB — I-STAT TROPONIN, ED: Troponin i, poc: 0 ng/mL (ref 0.00–0.08)

## 2016-04-20 MED ORDER — ACETAMINOPHEN 650 MG RE SUPP: 650.0000 mg | Freq: Four times a day (QID) | RECTAL | Status: DC | PRN

## 2016-04-20 MED ORDER — VITAMIN B-1 100 MG PO TABS
100.0000 mg | ORAL_TABLET | Freq: Every day | ORAL | Status: DC
Start: 2016-04-20 — End: 2016-04-21
  Administered 2016-04-20 – 2016-04-21 (×2): 100 mg via ORAL
  Filled 2016-04-20 (×2): qty 1

## 2016-04-20 MED ORDER — HYDRALAZINE HCL 20 MG/ML IJ SOLN
10.0000 mg | Freq: Four times a day (QID) | INTRAMUSCULAR | Status: DC | PRN
  Administered 2016-04-20: 10 mg via INTRAVENOUS
  Filled 2016-04-20: qty 1

## 2016-04-20 MED ORDER — ALBUTEROL SULFATE (2.5 MG/3ML) 0.083% IN NEBU
2.5000 mg | INHALATION_SOLUTION | RESPIRATORY_TRACT | Status: DC | PRN
  Administered 2016-04-21: 2.5 mg via RESPIRATORY_TRACT
  Filled 2016-04-20: qty 3

## 2016-04-20 MED ORDER — PNEUMOCOCCAL VAC POLYVALENT 25 MCG/0.5ML IJ INJ
0.5000 mL | INJECTION | INTRAMUSCULAR | Status: DC
  Filled 2016-04-20: qty 0.5

## 2016-04-20 MED ORDER — ONDANSETRON HCL 4 MG PO TABS: 4.0000 mg | ORAL_TABLET | Freq: Four times a day (QID) | ORAL | Status: DC | PRN

## 2016-04-20 MED ORDER — LORAZEPAM 1 MG PO TABS
1.0000 mg | ORAL_TABLET | Freq: Once | ORAL | Status: AC
  Administered 2016-04-20: 1 mg via ORAL
  Filled 2016-04-20: qty 1

## 2016-04-20 MED ORDER — TRAMADOL HCL 50 MG PO TABS
50.0000 mg | ORAL_TABLET | Freq: Once | ORAL | Status: AC
  Administered 2016-04-20: 50 mg via ORAL
  Filled 2016-04-20: qty 1

## 2016-04-20 MED ORDER — SULFAMETHOXAZOLE-TRIMETHOPRIM 800-160 MG PO TABS
1.0000 | ORAL_TABLET | Freq: Two times a day (BID) | ORAL | Status: DC
  Administered 2016-04-20 – 2016-04-21 (×2): 1 via ORAL
  Filled 2016-04-20 (×2): qty 1

## 2016-04-20 MED ORDER — IPRATROPIUM-ALBUTEROL 0.5-2.5 (3) MG/3ML IN SOLN
3.0000 mL | Freq: Once | RESPIRATORY_TRACT | Status: AC
  Administered 2016-04-20: 3 mL via RESPIRATORY_TRACT
  Filled 2016-04-20: qty 3

## 2016-04-20 MED ORDER — BUDESONIDE 0.5 MG/2ML IN SUSP
0.5000 mg | Freq: Two times a day (BID) | RESPIRATORY_TRACT | Status: DC
  Administered 2016-04-20 – 2016-04-21 (×2): 0.5 mg via RESPIRATORY_TRACT
  Filled 2016-04-20 (×2): qty 2

## 2016-04-20 MED ORDER — SODIUM CHLORIDE 0.9% FLUSH
3.0000 mL | Freq: Two times a day (BID) | INTRAVENOUS | Status: DC
  Administered 2016-04-20: 3 mL via INTRAVENOUS

## 2016-04-20 MED ORDER — ACETAMINOPHEN 325 MG PO TABS
650.0000 mg | ORAL_TABLET | Freq: Four times a day (QID) | ORAL | Status: DC | PRN
  Filled 2016-04-20: qty 2

## 2016-04-20 MED ORDER — ADULT MULTIVITAMIN W/MINERALS CH
1.0000 | ORAL_TABLET | Freq: Every day | ORAL | Status: DC
  Administered 2016-04-20 – 2016-04-21 (×2): 1 via ORAL
  Filled 2016-04-20 (×2): qty 1

## 2016-04-20 MED ORDER — ARFORMOTEROL TARTRATE 15 MCG/2ML IN NEBU
15.0000 ug | INHALATION_SOLUTION | Freq: Two times a day (BID) | RESPIRATORY_TRACT | Status: DC
  Administered 2016-04-20 – 2016-04-21 (×2): 15 ug via RESPIRATORY_TRACT
  Filled 2016-04-20 (×2): qty 2

## 2016-04-20 MED ORDER — OXYCODONE HCL 5 MG PO TABS
5.0000 mg | ORAL_TABLET | ORAL | Status: DC | PRN
  Administered 2016-04-20 – 2016-04-21 (×2): 5 mg via ORAL
  Filled 2016-04-20 (×2): qty 1

## 2016-04-20 MED ORDER — ONDANSETRON HCL 4 MG/2ML IJ SOLN: 4.0000 mg | Freq: Four times a day (QID) | INTRAMUSCULAR | Status: DC | PRN

## 2016-04-20 MED ORDER — POTASSIUM CHLORIDE CRYS ER 20 MEQ PO TBCR
40.0000 meq | EXTENDED_RELEASE_TABLET | Freq: Once | ORAL | Status: AC
  Administered 2016-04-20: 40 meq via ORAL
  Filled 2016-04-20: qty 2

## 2016-04-20 MED ORDER — ENOXAPARIN SODIUM 40 MG/0.4ML ~~LOC~~ SOLN
40.0000 mg | SUBCUTANEOUS | Status: DC
  Administered 2016-04-20: 40 mg via SUBCUTANEOUS
  Filled 2016-04-20: qty 0.4

## 2016-04-20 MED ORDER — SODIUM CHLORIDE 0.9 % IV SOLN
INTRAVENOUS | Status: DC
  Administered 2016-04-20: 18:00:00 via INTRAVENOUS
  Administered 2016-04-21: 1000 mL via INTRAVENOUS

## 2016-04-20 MED ORDER — DOCUSATE SODIUM 100 MG PO CAPS
100.0000 mg | ORAL_CAPSULE | Freq: Two times a day (BID) | ORAL | Status: DC
  Administered 2016-04-20 – 2016-04-21 (×2): 100 mg via ORAL
  Filled 2016-04-20 (×2): qty 1

## 2016-04-20 NOTE — ED Notes (Signed)
Pt was able to walk to the bathroom with no complaints. Pt attempted to use the restroom and collect a sample but she was unable to get urine in specimen cup.

## 2016-04-20 NOTE — ED Notes (Signed)
Per attending Dr, hold the Fluid/PO Challenge at this time
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
## 2016-04-20 NOTE — ED Notes (Signed)
Patient transported to CT 
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
## 2016-04-20 NOTE — Progress Notes (Signed)
Paged on call.  Patient complains of acute onset right sided chest pain.  O2 at 2L applied, obatained EKG.  H&P show chest pain at rest.  Patient states that was a long time ago.  Up to void.  Waiting for call back.   Prior to paging on call, called rapid response, spoke to MurphysBrooke, who said to do VS and get EKG.  The EKG shows normal sinus rhythm.  Will monitor. Hydralazine 10 mg given for BP and earlier she received 5 mg of oxycodone for low back pain.

## 2016-04-20 NOTE — ED Notes (Signed)
Patient transported to X-ray 

## 2016-04-21 DIAGNOSIS — N390 Urinary tract infection, site not specified: Secondary | ICD-10-CM

## 2016-04-21 DIAGNOSIS — I10 Essential (primary) hypertension: Secondary | ICD-10-CM | POA: Diagnosis not present

## 2016-04-21 DIAGNOSIS — I5032 Chronic diastolic (congestive) heart failure: Secondary | ICD-10-CM

## 2016-04-21 DIAGNOSIS — M545 Low back pain: Secondary | ICD-10-CM | POA: Diagnosis not present

## 2016-04-21 DIAGNOSIS — R413 Other amnesia: Secondary | ICD-10-CM | POA: Diagnosis not present

## 2016-04-21 DIAGNOSIS — M549 Dorsalgia, unspecified: Secondary | ICD-10-CM

## 2016-04-21 LAB — CBC
HCT: 39.8 % (ref 36.0–46.0)
Hemoglobin: 13.2 g/dL (ref 12.0–15.0)
MCH: 30.7 pg (ref 26.0–34.0)
MCHC: 33.2 g/dL (ref 30.0–36.0)
MCV: 92.6 fL (ref 78.0–100.0)
PLATELETS: 158 10*3/uL (ref 150–400)
RBC: 4.3 MIL/uL (ref 3.87–5.11)
RDW: 13.4 % (ref 11.5–15.5)
WBC: 10 10*3/uL (ref 4.0–10.5)

## 2016-04-21 LAB — COMPREHENSIVE METABOLIC PANEL
ALK PHOS: 40 U/L (ref 38–126)
ALT: 11 U/L — AB (ref 14–54)
AST: 24 U/L (ref 15–41)
Albumin: 3.9 g/dL (ref 3.5–5.0)
Anion gap: 6 (ref 5–15)
BILIRUBIN TOTAL: 0.7 mg/dL (ref 0.3–1.2)
BUN: 7 mg/dL (ref 6–20)
CALCIUM: 8.8 mg/dL — AB (ref 8.9–10.3)
CO2: 24 mmol/L (ref 22–32)
CREATININE: 0.95 mg/dL (ref 0.44–1.00)
Chloride: 108 mmol/L (ref 101–111)
GFR, EST NON AFRICAN AMERICAN: 59 mL/min — AB (ref >60)
Glucose, Bld: 164 mg/dL — ABNORMAL HIGH (ref 65–99)
Potassium: 4.1 mmol/L (ref 3.5–5.1)
Sodium: 138 mmol/L (ref 135–145)
TOTAL PROTEIN: 6.2 g/dL — AB (ref 6.5–8.1)

## 2016-04-21 LAB — HIV ANTIBODY (ROUTINE TESTING W REFLEX): HIV SCREEN 4TH GENERATION: NONREACTIVE

## 2016-04-21 LAB — RPR: RPR Ser Ql: NONREACTIVE

## 2016-04-21 MED ORDER — PANTOPRAZOLE SODIUM 40 MG PO TBEC: 40.0000 mg | DELAYED_RELEASE_TABLET | Freq: Every day | ORAL | 1 refills | Status: DC

## 2016-04-21 MED ORDER — DIAZEPAM 5 MG PO TABS: 2.5000 mg | ORAL_TABLET | Freq: Two times a day (BID) | ORAL | Status: DC | PRN

## 2016-04-21 MED ORDER — SULFAMETHOXAZOLE-TRIMETHOPRIM 800-160 MG PO TABS: 1.0000 | ORAL_TABLET | Freq: Two times a day (BID) | ORAL | 0 refills | Status: AC

## 2016-04-21 MED ORDER — CYCLOBENZAPRINE HCL 10 MG PO TABS: 5.0000 mg | ORAL_TABLET | Freq: Three times a day (TID) | ORAL | Status: DC | PRN

## 2016-04-21 MED ORDER — TRAMADOL HCL 50 MG PO TABS: 50.0000 mg | ORAL_TABLET | Freq: Three times a day (TID) | ORAL | 0 refills | Status: DC | PRN

## 2016-04-21 MED ORDER — GI COCKTAIL ~~LOC~~
30.0000 mL | Freq: Once | ORAL | Status: AC
  Administered 2016-04-21: 30 mL via ORAL
  Filled 2016-04-21: qty 30

## 2016-04-21 MED ORDER — MORPHINE SULFATE (PF) 2 MG/ML IV SOLN
2.0000 mg | Freq: Once | INTRAVENOUS | Status: AC
  Administered 2016-04-21: 2 mg via INTRAVENOUS
  Filled 2016-04-21: qty 1

## 2016-04-21 NOTE — Progress Notes (Signed)
NURSING PROGRESS NOTE  Cathe Monslvira S Sarr [id number] Discharge Data: 04/21/2016 3:21 PM Attending Provider: Vassie Lollarlos Madera, MD WUJ:[id number]:READE,ROBERT Lyn HollingsheadALEXANDER, MD   Cathe MonsElvira S Stach to be D/C'd Home per MD order.    All IV's will be discontinued and monitored for bleeding.  All belongings will be returned to patient for patient to take home.  Last Documented Vital Signs:  Blood pressure (!) 170/78, pulse 80, temperature 98.3 F (36.8 C), resp. rate 19, height 5\' 1"  (1.549 m), weight 68.7 kg (151 lb 6.4 oz), SpO2 98 %.  Madelin RearLonnie Alexina Niccoli, MSN, RN, [employer] EnergyCMSRN
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
## 2016-04-21 NOTE — Progress Notes (Signed)
Patient is now complaining that her chest feels like its on fire; on the outside.  Troponin was 0.03, B/P is down from earlier.  Paged on call and checked in with Rapid Response. Will continue to monitor.

## 2016-04-21 NOTE — Progress Notes (Signed)
OT Cancellation Note  Patient Details Name: Renee Adkins MRN: [id number] DOB: 04/21/1946   Cancelled Treatment:    Reason Eval/Treat Not Completed: OT screened, no needs identified, will sign off. Per PT, pt independent with all mobility; no balance deficits noted with higher level balance activities. No acute OT needs identified; will screen pt at this time. Please re-consult if needs change. Thank you for this referral.  Gaye AlkenBailey A Markevius Trombetta M.S., OTR/L Pager: [phone]  04/21/2016, 1:43 PM

## 2016-04-21 NOTE — Progress Notes (Signed)
Called per floor RN ar [phone] regarding Pt with acute chest pain this am. Pt seen at bedside. Found resting in bed quietly. Oriented x 4, chest and lower back pain10/10 pain. Chest intact, no bruising or swelling, pain is reproducible with touch in middle of chest and she also complains of pain in her ABD to touch. ABD soft non distended. Merdis DelayK. Schorr Triad NP paged and updated on Pt status. GI cocktail and morphine IVP ordered. RN to given meds as ordered and update myself and Provider for worsening changes. Will continue to follow tonight.
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
## 2016-04-21 NOTE — Progress Notes (Signed)
Call received per floor RN regarding Pt with chest pain. Unable to come to bedside right away. Advised RN to check EKG, set of VS and update Primary Provider on Pt status. EKG reviewed per [facility], Troponin ordered, and ativan 1 mg po given. Came to floor around midnight to check on Pt and she was asleep. Per floor RN she has denied chest pain since ativan earlier this evening. RRT will continue to follow tonight, no RRT interventions at this time.
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
## 2016-04-22 LAB — URINE CULTURE

## 2016-05-14 ENCOUNTER — Encounter: Payer: Self-pay | Admitting: *Deleted

## 2016-05-14 ENCOUNTER — Ambulatory Visit (INDEPENDENT_AMBULATORY_CARE_PROVIDER_SITE_OTHER): Payer: Medicare Other | Admitting: Diagnostic Neuroimaging

## 2016-05-14 VITALS — BP 137/71 | HR 68 | Ht 61.0 in | Wt 149.0 lb

## 2016-05-14 DIAGNOSIS — R404 Transient alteration of awareness: Secondary | ICD-10-CM | POA: Diagnosis not present

## 2016-05-14 DIAGNOSIS — F0781 Postconcussional syndrome: Secondary | ICD-10-CM
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
## 2016-05-31 ENCOUNTER — Telehealth: Payer: Self-pay | Admitting: *Deleted

## 2016-05-31 NOTE — Telephone Encounter (Signed)
[place] DMV forms completed, signed, sent to MR for processing. 

## 2016-06-03 ENCOUNTER — Ambulatory Visit: Payer: Medicare Other | Admitting: Neurology

## 2016-06-04 DIAGNOSIS — Z0289 Encounter for other administrative examinations: Secondary | ICD-10-CM

## 2016-06-05 ENCOUNTER — Ambulatory Visit (INDEPENDENT_AMBULATORY_CARE_PROVIDER_SITE_OTHER): Payer: Medicare Other | Admitting: Diagnostic Neuroimaging

## 2016-06-05 DIAGNOSIS — R410 Disorientation, unspecified: Secondary | ICD-10-CM

## 2016-06-05 DIAGNOSIS — R404 Transient alteration of awareness: Secondary | ICD-10-CM

## 2016-06-10 ENCOUNTER — Telehealth: Payer: Self-pay | Admitting: Diagnostic Neuroimaging

## 2016-06-10 NOTE — Telephone Encounter (Signed)
Patient is calling to get EEG results. °

## 2016-06-12 ENCOUNTER — Telehealth: Payer: Self-pay | Admitting: *Deleted

## 2016-06-12 NOTE — Procedures (Signed)
[facility]  EEG (ELECTROENCEPHALOGRAM) REPORT   STUDY DATE: 06/05/16 PATIENT NAME: Renee Adkins DOB: April 27, 1946 MRN: [id number]  ORDERING CLINICIAN: Joycelyn Schmid, MD   TECHNOLOGIST: Gearldine Shown  TECHNIQUE: Electroencephalogram was recorded utilizing standard 10-20 system of lead placement and reformatted into average and bipolar montages.  RECORDING TIME: 27 minutes ACTIVATION: hyperventilation and photic stimulation  CLINICAL INFORMATION: 70 year old female with transient confusion.  FINDINGS: Background rhythms of 8-9 hertz and 30-40 microvolts. No focal, lateralizing, epileptiform activity or seizures are seen. Patient recorded in the awake and drowsy state. EKG channel shows regular rhythm of 65-70 beats per minute.  IMPRESSION:  Normal EEG in the awake and drowsy states.    INTERPRETING PHYSICIAN:  Suanne Marker, MD Certified in Neurology, Neurophysiology and Neuroimaging  [facility] [address] [place], [place] [address] [phone]

## 2016-06-12 NOTE — Telephone Encounter (Signed)
Called patient, apologized for the delay in calling her with EEG results. Advised her Dr Marjory LiesPenumalli is reviewing her EEG this evening. She should get a call either from Dr Marjory LiesPenumalli tonight or this RN tomorrow. She stated [facility] and has to [facility] back within limited time. This is the reason for her calling. She verbalized understanding, appreciation.

## 2016-06-12 NOTE — Telephone Encounter (Signed)
Pt called in to get EEG results. She is needing results to give [facility] to [facility]. Please call [phone]

## 2016-06-12 NOTE — Telephone Encounter (Signed)
I called patient. No answer. EEG is normal. -VRP
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
## 2016-07-30 ENCOUNTER — Ambulatory Visit: Payer: Self-pay | Admitting: Obstetrics

## 2016-08-20 ENCOUNTER — Encounter: Payer: Self-pay | Admitting: Diagnostic Neuroimaging

## 2016-08-20 ENCOUNTER — Ambulatory Visit (INDEPENDENT_AMBULATORY_CARE_PROVIDER_SITE_OTHER): Payer: Medicare Other | Admitting: Diagnostic Neuroimaging

## 2016-08-20 VITALS — BP 128/73 | HR 73 | Wt 145.6 lb

## 2016-08-20 DIAGNOSIS — R404 Transient alteration of awareness: Secondary | ICD-10-CM

## 2016-08-20 DIAGNOSIS — F0781 Postconcussional syndrome: Secondary | ICD-10-CM | POA: Diagnosis not present
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
## 2016-09-12 ENCOUNTER — Telehealth: Payer: Self-pay | Admitting: Diagnostic Neuroimaging

## 2016-09-12 NOTE — Telephone Encounter (Signed)
Pt called said she has some questions reg her last OV. She did not want to go into detail
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
## 2016-10-22 ENCOUNTER — Telehealth: Payer: Self-pay | Admitting: *Deleted

## 2016-10-22 NOTE — Telephone Encounter (Signed)
DMV papers received , completed, reviewed and signed by Dr Marjory LiesPenumalli. Sent to MR for processing.

## 2016-11-07 DIAGNOSIS — Z0289 Encounter for other administrative examinations: Secondary | ICD-10-CM

## 2016-11-27 ENCOUNTER — Telehealth: Payer: Self-pay | Admitting: *Deleted

## 2016-11-27 NOTE — Telephone Encounter (Signed)
Pt calling asking for a call to know if the paperwork she brought in this month (re: DMV) is ready to be sent out or for her to come and get.

## 2016-11-27 NOTE — Telephone Encounter (Signed)
Pt DMV form on [place] young desk.

## 2016-11-27 NOTE — Telephone Encounter (Signed)
Pt brought in Renee Adkins form for Renee Adkins to fill out.  The first form did not have page 7.  This was completed and placed in Dr. Richrd Humbles office for review and signature.  She may drive 1-61-09, as per pt she has not had any further episodes.

## 2016-11-28 NOTE — Telephone Encounter (Signed)
Form to MR,  Stanton Kidney in MR.

## 2016-11-28 NOTE — Telephone Encounter (Signed)
Form signed. -VRP 

## 2017-07-16 ENCOUNTER — Other Ambulatory Visit (HOSPITAL_COMMUNITY)
Admission: RE | Admit: 2017-07-16 | Discharge: 2017-07-16 | Disposition: A | Discharge status: Home / Self Care | Payer: Medicare Other | Source: Ambulatory Visit | Attending: Obstetrics | Admitting: Obstetrics

## 2017-07-16 ENCOUNTER — Ambulatory Visit (INDEPENDENT_AMBULATORY_CARE_PROVIDER_SITE_OTHER): Payer: Medicare Other | Admitting: Obstetrics

## 2017-07-16 ENCOUNTER — Other Ambulatory Visit: Payer: Self-pay

## 2017-07-16 ENCOUNTER — Encounter: Payer: Self-pay | Admitting: Obstetrics

## 2017-07-16 VITALS — BP 108/66 | HR 99 | Ht 59.0 in | Wt 148.0 lb

## 2017-07-16 DIAGNOSIS — Z01419 Encounter for gynecological examination (general) (routine) without abnormal findings: Secondary | ICD-10-CM

## 2017-07-16 DIAGNOSIS — Z780 Asymptomatic menopausal state: Secondary | ICD-10-CM

## 2017-07-16 NOTE — Progress Notes (Signed)
Subjective:        Renee Adkins is a 71 y.o. female here for a routine exam.  Current complaints: None.    Personal health questionnaire:  Is patient Renee Adkins, have a family history of breast and/or ovarian cancer: no Is there a family history of uterine cancer diagnosed at age < 6450, gastrointestinal cancer, urinary tract cancer, family member who is a Personnel officerLynch syndrome-associated carrier: no Is the patient overweight and hypertensive, family history of diabetes, personal history of gestational diabetes, preeclampsia or PCOS: no Is patient over 3855, have PCOS,  family history of premature CHD under age 72, diabetes, smoke, have hypertension or peripheral artery disease:  no At any time, has a partner hit, kicked or otherwise hurt or frightened you?: no Over the past 2 weeks, have you felt down, depressed or hopeless?: no Over the past 2 weeks, have you felt little interest or pleasure in doing things?:no   Gynecologic History No LMP recorded. Patient has had a hysterectomy. Contraception: status post hysterectomy Last Pap: n/a. Results were: n/a Last mammogram: 2018. Results were: normal  Obstetric History OB History  Gravida Para Term Preterm AB Living  2 2 2     1   SAB TAB Ectopic Multiple Live Births          2    # Outcome Date GA Lbr Len/2nd Weight Sex Delivery Anes PTL Lv  2 Term         LIV  1 Term         DEC      Past Medical History:  Diagnosis Date  . Anxiety 01/25/2012  . Asthma   . Chronic diastolic congestive heart failure (HCC) 01/29/2012  . Hypertension   . Paresthesia 01/27/2012  . Pericarditis   . Seizures (HCC)     Past Surgical History:  Procedure Laterality Date  . COLONOSCOPY  03/2009   repeat due 5-10 years  . DG  BONE DENSITY ([facility] HX)  11/2014   normal  . TOTAL KNEE ARTHROPLASTY  Jan 2011   rt knee  . VESICOVAGINAL FISTULA CLOSURE W/ TAH       Current Outpatient Medications:  .  albuterol (PROVENTIL HFA;VENTOLIN HFA) 108 (90  BASE) MCG/ACT inhaler, Inhale 2-4 puffs into the lungs every 4 (four) hours as needed for wheezing or shortness of breath., Disp: 1 Inhaler, Rfl: 0 .  arformoterol (BROVANA) 15 MCG/2ML NEBU, Take 15 mcg by nebulization 2 (two) times daily. , Disp: , Rfl:  .  aspirin EC 81 MG tablet, Take 81 mg by mouth every evening., Disp: , Rfl:  .  budesonide (PULMICORT) 0.5 MG/2ML nebulizer solution, Take 0.5 mg by nebulization 2 (two) times daily. , Disp: , Rfl:  .  Cholecalciferol ([employer]S VIT D 5000 HIGH-POTENCY PO), Take 5,000 Units by mouth daily., Disp: , Rfl:  .  cyclobenzaprine (FLEXERIL) 10 MG tablet, Take 0.5 tablets (5 mg total) by mouth 3 (three) times daily as needed. For muscle spasms, Disp: , Rfl:  .  diazepam (VALIUM) 5 MG tablet, Take 0.5 tablets (2.5 mg total) by mouth every 12 (twelve) hours as needed for anxiety. To relax, Disp: , Rfl:  .  hydrochlorothiazide (HYDRODIURIL) 25 MG tablet, Take 12.5 mg by mouth daily., Disp: , Rfl:  .  Nebivolol HCl 20 MG TABS, Take 20 mg by mouth every morning., Disp: , Rfl:  .  polyvinyl alcohol (LIQUIFILM TEARS) 1.4 % ophthalmic solution, Place 1 drop into both eyes daily as needed  for dry eyes. , Disp: , Rfl:  .  potassium chloride SA (K-DUR,KLOR-CON) 20 MEQ tablet, Take 20 mEq by mouth daily., Disp: , Rfl:  .  promethazine-dextromethorphan (PROMETHAZINE-DM) 6.25-15 MG/5ML syrup, Take 5 mLs by mouth 4 (four) times daily as needed for cough., Disp: , Rfl:  .  traMADol (ULTRAM) 50 MG tablet, Take 1 tablet (50 mg total) by mouth every 8 (eight) hours as needed for severe pain., Disp: 20 tablet, Rfl: 0 .  vitamin C (ASCORBIC ACID) 500 MG tablet, Take 500 mg by mouth daily., Disp: , Rfl:  .  zolpidem (AMBIEN) 10 MG tablet, 10 mg as needed., Disp: , Rfl:  Allergies  Allergen Reactions  . Azithromycin Shortness Of Breath and Swelling  . Codeine Nausea And Vomiting and Other (See Comments)    REACTION: tachycardia  . Levofloxacin Shortness Of Breath  . Meloxicam  Shortness Of Breath  . Penicillins Anaphylaxis  . Naproxen Nausea And Vomiting    Social History   Tobacco Use  . Smoking status: Never Smoker  . Smokeless tobacco: Never Used  Substance Use Topics  . Alcohol use: Yes    Alcohol/week: 0.0 oz    Comment: Socially     Family History  Problem Relation Age of Onset  . Heart attack Father 5557  . Heart attack Brother   . Cancer - Lung Brother   . Cancer Sister        liver  . Asthma Neg Hx   . Allergies Neg Hx       Review of Systems  Constitutional: negative for fatigue and weight loss Respiratory: negative for cough and wheezing Cardiovascular: negative for chest pain, fatigue and palpitations Gastrointestinal: negative for abdominal pain and change in bowel habits Musculoskeletal:negative for myalgias Neurological: negative for gait problems and tremors Behavioral/Psych: negative for abusive relationship, depression Endocrine: negative for temperature intolerance    Genitourinary:negative for abnormal menstrual periods, genital lesions, hot flashes, sexual problems and vaginal discharge Integument/breast: negative for breast lump, breast tenderness, nipple discharge and skin lesion(s)    Objective:       BP 108/66   Pulse 99   Ht 4\' 11"  (1.499 m)   Wt 148 lb (67.1 kg)   BMI 29.89 kg/m  General:   alert  Skin:   no rash or abnormalities  Lungs:   clear to auscultation bilaterally  Heart:   regular rate and rhythm, S1, S2 normal, no murmur, click, rub or gallop  Breasts:   normal without suspicious masses, skin or nipple changes or axillary nodes  Abdomen:  normal findings: no organomegaly, soft, non-tender and no hernia  Pelvis:  External genitalia: normal general appearance Urinary system: urethral meatus normal and bladder without fullness, nontender Vaginal: normal without tenderness, induration or masses Cervix: absent Adnexa: absent Uterus: absent   Lab Review Urine pregnancy test Labs reviewed  yes Radiologic studies reviewed yes  50% of 20 min visit spent on counseling and coordination of care.   Assessment:     1. Encounter for gynecological examination - doing well  2. Postmenopause - mild vasomotor changes - sexually active, no problems   Plan:    Education reviewed: calcium supplements, depression evaluation, low fat, low cholesterol diet, self breast exams and weight bearing exercise. Follow up in: 2 years.   No orders of the defined types were placed in this encounter.  No orders of the defined types were placed in this encounter.

## 2017-07-16 NOTE — Progress Notes (Signed)
Presents for AEX. Reports no problems today. Had her Mammogram and Bone Density Tests done in 11//2018.

## 2017-07-16 NOTE — Addendum Note (Signed)
Addended by: Maretta BeesMCGLASHAN, Ballard Budney J on: 07/16/2017 11:58 AM   Modules accepted: Orders

## 2017-07-17 LAB — CERVICOVAGINAL ANCILLARY ONLY
Bacterial vaginitis: POSITIVE — AB
CANDIDA VAGINITIS: POSITIVE — AB
TRICH (WINDOWPATH): NEGATIVE

## 2017-07-18 ENCOUNTER — Telehealth: Payer: Self-pay

## 2017-07-18 ENCOUNTER — Other Ambulatory Visit: Payer: Self-pay | Admitting: Obstetrics

## 2017-07-18 DIAGNOSIS — N760 Acute vaginitis: Secondary | ICD-10-CM

## 2017-07-18 DIAGNOSIS — B3731 Acute candidiasis of vulva and vagina: Secondary | ICD-10-CM

## 2017-07-18 DIAGNOSIS — B373 Candidiasis of vulva and vagina: Secondary | ICD-10-CM

## 2017-07-18 DIAGNOSIS — B9689 Other specified bacterial agents as the cause of diseases classified elsewhere: Secondary | ICD-10-CM

## 2017-07-18 MED ORDER — TERCONAZOLE 0.8 % VA CREA: 1.0000 | TOPICAL_CREAM | Freq: Every day | VAGINAL | 2 refills | Status: DC

## 2017-07-18 MED ORDER — TINIDAZOLE 500 MG PO TABS: 1000.0000 mg | ORAL_TABLET | Freq: Every day | ORAL | 2 refills | Status: DC

## 2017-07-18 NOTE — Telephone Encounter (Signed)
-----   Message from Brock Badharles A Harper, MD sent at 07/18/2017  6:07 AM EST ----- Tinidazole Rx for BV Terazol 3 Rx for yeast

## 2017-07-18 NOTE — Telephone Encounter (Signed)
Patient notified of results and Rx. 
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
## 2017-07-29 ENCOUNTER — Telehealth: Payer: Self-pay | Admitting: Pediatrics

## 2017-07-29 NOTE — Telephone Encounter (Signed)
Pt called nurse line-did not leave voicemail.  I called patient to check in.  LMTCB if needed.

## 2017-07-29 NOTE — Telephone Encounter (Signed)
I spoke with Ms. Bolle on the phone this am. She requests to speak directly with you in regards to some research you asked her to do.  Please call back. Thanks.

## 2017-12-02 ENCOUNTER — Telehealth: Payer: Self-pay | Admitting: Diagnostic Neuroimaging

## 2017-12-02 NOTE — Telephone Encounter (Signed)
Spoke to pt and she received a f/u DMV form for us to fill out.  (dated 11/18/2017 but she just received).  Has 30 days to fill out and fax.  I relayed that not sure if needs to be seen or can fill out.  Will touch base on 12-11-17 to see what Dr. Marjory LiesPenumalli suggests.  Pt last seen, was told to go back to pcp, but pcp would rather us do form per pt.  No availability on NP schedule up to 12-18-17.  Pt has been fine no episodes.  Has been driving since 1/61/093/10/19. Please advise.

## 2017-12-02 NOTE — Telephone Encounter (Signed)
Pt called requesting an appt with Dr. Marjory LiesPenumalli to have her evaluated for her DMV paperwork. Pt would like a call back to discuss an appt once hearing Dr. Richrd HumblesPenumalli's next available appt will be October

## 2017-12-11 NOTE — Telephone Encounter (Signed)
Ok to fill form. -VRP

## 2017-12-11 NOTE — Telephone Encounter (Signed)
Spoke to pt and relayed per Dr. Marjory Lies ok to fill out, no need for appt. Pt will bring in Friday or next Monday. Relayed about release form and $50.00.  I would not be here next week, but other RN would get, pt verbalized understanding.

## 2017-12-15 DIAGNOSIS — Z0289 Encounter for other administrative examinations: Secondary | ICD-10-CM

## 2017-12-25 ENCOUNTER — Telehealth: Payer: Self-pay | Admitting: *Deleted

## 2017-12-25 ENCOUNTER — Telehealth: Payer: Self-pay | Admitting: Diagnostic Neuroimaging

## 2017-12-25 NOTE — Telephone Encounter (Signed)
Pt called stating today is the only day she will have transportation and would like to pick up DMV forms today if possible. Please call to advise

## 2017-12-25 NOTE — Telephone Encounter (Signed)
Spoke to pt and informed her the finished documents have been faxed to the [facility] and a copy of the forms will be left up front for her to pick up at any time.

## 2017-12-25 NOTE — Telephone Encounter (Signed)
Completed and signed by Dr. Marjory Lies. To MR, Lowella Bandy covering today.

## 2017-12-25 NOTE — Telephone Encounter (Signed)
Pt came into the office today wanting her finished DMV forms. I informed her we did receive them and the doctor has started filling them out. She wants [place] to fax them to the [facility] when they are done and also save her a copy to pick up for herself. I let her know we will give her a call when they finished.

## 2017-12-25 NOTE — Telephone Encounter (Signed)
Pt calling about her DMV form.  I see from 11/2016 DMV note.  I have not received another form.  Will forward to [employer].

## 2019-05-18 ENCOUNTER — Other Ambulatory Visit: Payer: Self-pay | Admitting: Family Medicine

## 2019-05-18 DIAGNOSIS — M8580 Other specified disorders of bone density and structure, unspecified site: Secondary | ICD-10-CM

## 2020-01-06 ENCOUNTER — Other Ambulatory Visit: Payer: Self-pay

## 2020-01-06 ENCOUNTER — Ambulatory Visit (INDEPENDENT_AMBULATORY_CARE_PROVIDER_SITE_OTHER): Payer: Medicare Other | Admitting: Obstetrics

## 2020-01-06 ENCOUNTER — Other Ambulatory Visit (HOSPITAL_COMMUNITY)
Admission: RE | Admit: 2020-01-06 | Discharge: 2020-01-06 | Disposition: A | Discharge status: Home / Self Care | Payer: Medicare Other | Source: Ambulatory Visit | Attending: Obstetrics | Admitting: Obstetrics

## 2020-01-06 ENCOUNTER — Encounter: Payer: Self-pay | Admitting: Obstetrics

## 2020-01-06 VITALS — BP 176/80 | HR 112 | Wt 159.2 lb

## 2020-01-06 DIAGNOSIS — N898 Other specified noninflammatory disorders of vagina: Secondary | ICD-10-CM

## 2020-01-06 DIAGNOSIS — B351 Tinea unguium: Secondary | ICD-10-CM

## 2020-01-06 DIAGNOSIS — Z90710 Acquired absence of both cervix and uterus: Secondary | ICD-10-CM

## 2020-01-06 DIAGNOSIS — Z90722 Acquired absence of ovaries, bilateral: Secondary | ICD-10-CM

## 2020-01-06 DIAGNOSIS — Z9079 Acquired absence of other genital organ(s): Secondary | ICD-10-CM

## 2020-01-06 DIAGNOSIS — Z01419 Encounter for gynecological examination (general) (routine) without abnormal findings: Secondary | ICD-10-CM

## 2020-01-06 DIAGNOSIS — I10 Essential (primary) hypertension: Secondary | ICD-10-CM

## 2020-01-06 NOTE — Progress Notes (Signed)
Subjective:        Renee Adkins is a 74 y.o. female here for a routine exam.  Current complaints: None.    Personal health questionnaire:  Is patient Renee Adkins, have a family history of breast and/or ovarian cancer: no Is there a family history of uterine cancer diagnosed at age < 68, gastrointestinal cancer, urinary tract cancer, family member who is a Field seismologist syndrome-associated carrier: no Is the patient overweight and hypertensive, family history of diabetes, personal history of gestational diabetes, preeclampsia or PCOS: no Is patient over 18, have PCOS,  family history of premature CHD under age 58, diabetes, smoke, have hypertension or peripheral artery disease:  no At any time, has a partner hit, kicked or otherwise hurt or frightened you?: no Over the past 2 weeks, have you felt down, depressed or hopeless?: no Over the past 2 weeks, have you felt little interest or pleasure in doing things?:no   Gynecologic History No LMP recorded. Patient has had a hysterectomy. Contraception: status post hysterectomy Last Pap: unknown. Results were: normal Last mammogram: 2020. Results were: normal  Obstetric History OB History  Gravida Para Term Preterm AB Living  2 2 2     1   SAB TAB Ectopic Multiple Live Births          2    # Outcome Date GA Lbr Len/2nd Weight Sex Delivery Anes PTL Lv  2 Term         LIV  1 Term         DEC    Past Medical History:  Diagnosis Date  . Anxiety 01/25/2012  . Asthma   . Chronic diastolic congestive heart failure ([place]) 01/29/2012  . Hypertension   . Paresthesia 01/27/2012  . Pericarditis   . Seizures ([place])     Past Surgical History:  Procedure Laterality Date  . COLONOSCOPY  03/2009   repeat due 5-10 years  . DG  BONE DENSITY ([facility] HX)  11/2014   normal  . TOTAL KNEE ARTHROPLASTY  Jan 2011   rt knee  . VESICOVAGINAL FISTULA CLOSURE W/ TAH       Current Outpatient Medications:  .  albuterol (PROVENTIL HFA;VENTOLIN HFA) 108 (90  BASE) MCG/ACT inhaler, Inhale 2-4 puffs into the lungs every 4 (four) hours as needed for wheezing or shortness of breath., Disp: 1 Inhaler, Rfl: 0 .  aspirin EC 81 MG tablet, Take 81 mg by mouth every evening., Disp: , Rfl:  .  budesonide (PULMICORT) 0.5 MG/2ML nebulizer solution, Take 0.5 mg by nebulization 2 (two) times daily. , Disp: , Rfl:  .  Cholecalciferol ([employer]S VIT D 5000 HIGH-POTENCY PO), Take 5,000 Units by mouth daily., Disp: , Rfl:  .  citalopram (CELEXA) 40 MG tablet, Take 40 mg by mouth daily., Disp: , Rfl:  .  cyclobenzaprine (FLEXERIL) 10 MG tablet, Take 0.5 tablets (5 mg total) by mouth 3 (three) times daily as needed. For muscle spasms, Disp: , Rfl:  .  diazepam (VALIUM) 5 MG tablet, Take 0.5 tablets (2.5 mg total) by mouth every 12 (twelve) hours as needed for anxiety. To relax, Disp: , Rfl:  .  hydrochlorothiazide (HYDRODIURIL) 25 MG tablet, Take 12.5 mg by mouth daily., Disp: , Rfl:  .  pantoprazole (PROTONIX) 40 MG tablet, Take 40 mg by mouth daily., Disp: , Rfl:  .  polyvinyl alcohol (LIQUIFILM TEARS) 1.4 % ophthalmic solution, Place 1 drop into both eyes daily as needed for dry eyes. , Disp: , Rfl:  .  potassium chloride SA (K-DUR,KLOR-CON) 20 MEQ tablet, Take 20 mEq by mouth daily., Disp: , Rfl:  .  promethazine-dextromethorphan (PROMETHAZINE-DM) 6.25-15 MG/5ML syrup, Take 5 mLs by mouth 4 (four) times daily as needed for cough., Disp: , Rfl:  .  traMADol (ULTRAM) 50 MG tablet, Take 1 tablet (50 mg total) by mouth every 8 (eight) hours as needed for severe pain., Disp: 20 tablet, Rfl: 0 .  vitamin C (ASCORBIC ACID) 500 MG tablet, Take 500 mg by mouth daily., Disp: , Rfl:  .  zolpidem (AMBIEN) 10 MG tablet, 10 mg as needed., Disp: , Rfl:  .  arformoterol (BROVANA) 15 MCG/2ML NEBU, Take 15 mcg by nebulization 2 (two) times daily. , Disp: , Rfl:  .  Nebivolol HCl 20 MG TABS, Take 20 mg by mouth every morning., Disp: , Rfl:  .  terconazole (TERAZOL 3) 0.8 % vaginal cream, Place  1 applicator vaginally at bedtime. (Patient not taking: Reported on 01/06/2020), Disp: 20 g, Rfl: 2 .  tinidazole (TINDAMAX) 500 MG tablet, Take 2 tablets (1,000 mg total) by mouth daily with breakfast. (Patient not taking: Reported on 01/06/2020), Disp: 10 tablet, Rfl: 2 Allergies  Allergen Reactions  . Azithromycin Shortness Of Breath and Swelling  . Codeine Nausea And Vomiting and Other (See Comments)    REACTION: tachycardia  . Levofloxacin Shortness Of Breath  . Meloxicam Shortness Of Breath  . Penicillins Anaphylaxis  . Naproxen Nausea And Vomiting    Social History   Tobacco Use  . Smoking status: Never Smoker  . Smokeless tobacco: Never Used  Substance Use Topics  . Alcohol use: Yes    Alcohol/week: 0.0 standard drinks    Comment: Socially     Family History  Problem Relation Age of Onset  . Heart attack Father 54  . Heart attack Brother   . Cancer - Lung Brother   . Cancer Sister        liver  . Asthma Neg Hx   . Allergies Neg Hx       Review of Systems  Constitutional: negative for fatigue and weight loss Respiratory: negative for cough and wheezing Cardiovascular: negative for chest pain, fatigue and palpitations Gastrointestinal: negative for abdominal pain and change in bowel habits Musculoskeletal:negative for myalgias Neurological: negative for gait problems and tremors Behavioral/Psych: negative for abusive relationship, depression Endocrine: negative for temperature intolerance    Genitourinary:negative for abnormal menstrual periods, genital lesions, hot flashes, sexual problems and vaginal discharge Integument/breast: negative for breast lump, breast tenderness, nipple discharge and skin lesion(s)    Objective:       BP (!) 176/80   Pulse (!) 112   Wt 159 lb 3.2 oz (72.2 kg)   BMI 32.15 kg/m  General:   alert  Skin:   no rash or abnormalities  Lungs:   clear to auscultation bilaterally  Heart:   regular rate and rhythm, S1, S2 normal, no  murmur, click, rub or gallop  Breasts:   normal without suspicious masses, skin or nipple changes or axillary nodes  Abdomen:  normal findings: no organomegaly, soft, non-tender and no hernia  Pelvis:  External genitalia: normal general appearance Urinary system: urethral meatus normal and bladder without fullness, nontender Vaginal: normal without tenderness, induration or masses Cervix: absent Adnexa: absent Uterus: absent   Lab Review Urine pregnancy test Labs reviewed yes Radiologic studies reviewed yes  50% of 20 min visit spent on counseling and coordination of care.   Assessment:     1. Encounter  for gynecological examination  2. S/P TAH-BSO (total abdominal hysterectomy and bilateral salpingo-oophorectomy)  3. Vaginal discharge Rx: - Cervicovaginal ancillary only( [place])  4. Onychomycosis of toenail Rx: - Ambulatory referral to Podiatry  5. HTN (hypertension), benign - managed by PCP    Plan:    Education reviewed: calcium supplements, depression evaluation, low fat, low cholesterol diet, safe sex/STD prevention, self breast exams, skin cancer screening and weight bearing exercise. Follow up in: 1 year.    Orders Placed This Encounter  Procedures  . Ambulatory referral to Podiatry    Referral Priority:   Routine    Referral Type:   Consultation    Referral Reason:   Specialty Services Required    Requested Specialty:   Podiatry    Number of Visits Requested:   1    Brock Bad, MD 01/06/2020 9:59 AM

## 2020-01-06 NOTE — Progress Notes (Signed)
Pt presents for annual and vaginal discharge.  GAD = 12 -Takes Celexa as needed  Colonoscopy due in 3 yrs per pt  Bone Density due in 2 yrs per pt  Mammogram UTD per pt

## 2020-01-07 LAB — CERVICOVAGINAL ANCILLARY ONLY
Chlamydia: NEGATIVE
Comment: NEGATIVE
Comment: NEGATIVE
Comment: NORMAL
Neisseria Gonorrhea: NEGATIVE
Trichomonas: NEGATIVE

## 2020-02-02 ENCOUNTER — Telehealth: Payer: Self-pay | Admitting: Family Medicine

## 2020-02-02 NOTE — Telephone Encounter (Signed)
LVM for pt to cb regarding her message on her pap results.

## 2020-02-02 NOTE — Telephone Encounter (Signed)
Pt called she need her pap smear Result please Advice

## 2020-02-10 ENCOUNTER — Other Ambulatory Visit: Payer: Self-pay

## 2020-02-10 ENCOUNTER — Ambulatory Visit (INDEPENDENT_AMBULATORY_CARE_PROVIDER_SITE_OTHER): Payer: Medicare Other | Admitting: Podiatry

## 2020-02-10 VITALS — Temp 97.1°F

## 2020-02-10 DIAGNOSIS — M79674 Pain in right toe(s): Secondary | ICD-10-CM | POA: Diagnosis not present

## 2020-02-10 DIAGNOSIS — M2011 Hallux valgus (acquired), right foot: Secondary | ICD-10-CM | POA: Diagnosis not present

## 2020-02-10 DIAGNOSIS — M21611 Bunion of right foot: Secondary | ICD-10-CM

## 2020-02-10 DIAGNOSIS — M2041 Other hammer toe(s) (acquired), right foot: Secondary | ICD-10-CM

## 2020-02-10 DIAGNOSIS — L608 Other nail disorders: Secondary | ICD-10-CM

## 2020-02-10 NOTE — Progress Notes (Signed)
Subjective:  Patient ID: Renee Adkins, female    DOB: 12-20-1945,  MRN: [id number]  Chief Complaint  Patient presents with  . Callouses    "Corns" - R 1st and 2nd toes. x1+ yr. Pt stated, "They're painful - they make it difficult to wear shoes".  . Nail Problem    Dark line through nail - L hallux.   74 y.o. female presents with the above complaint. History confirmed with patient.   Objective:  Physical Exam: warm, good capillary refill, no trophic changes or ulcerative lesions, normal DP and PT pulses and normal sensory exam. Left Foot: HAV deformity, mild, no POP. Left hallux nail longitudinal melanonychia wihtout Hutchinson's sign  Right Foot: HAV deformity, mod, with HPK lateral hallux, medial 2nd toe   Assessment:   1. Hallux valgus with bunions of right foot   2. Hammer toe of right foot   3. Pain in toe of right foot   4. Longitudinal melanonychia      Plan:  Patient was evaluated and treated and all questions answered.  Bunion and Hammertoe -Educated on etiology of deformity -Discussed proper shoe gear modifications and padding  -Silicone toe spacer dispensed -XRs next visit to eval bunion  L hallux longitudinal melanonychia -Benign, educated on etiology -Educated on signs of worsening. -F/u PRN  Return in about 6 weeks (around 03/23/2020) for Bunion f/u right with XRs .

## 2020-03-02 ENCOUNTER — Ambulatory Visit (INDEPENDENT_AMBULATORY_CARE_PROVIDER_SITE_OTHER): Payer: Medicare Other | Admitting: Obstetrics

## 2020-03-02 ENCOUNTER — Encounter: Payer: Self-pay | Admitting: Obstetrics

## 2020-03-02 ENCOUNTER — Other Ambulatory Visit: Payer: Self-pay

## 2020-03-02 VITALS — BP 162/85 | HR 78 | Wt 170.0 lb

## 2020-03-02 DIAGNOSIS — N766 Ulceration of vulva: Secondary | ICD-10-CM | POA: Diagnosis not present

## 2020-03-02 DIAGNOSIS — N898 Other specified noninflammatory disorders of vagina: Secondary | ICD-10-CM

## 2020-03-02 NOTE — Progress Notes (Signed)
RGYN patient present for problem visit today.  CC: Vaginal burning and itching x 1 week now.pt states she looked with a mirror and noticed a bump with puss pt is not sexually active.   Pt needs Brovana Deleted from medication list.

## 2020-03-02 NOTE — Progress Notes (Signed)
Patient ID: Renee Adkins, female   DOB: 1946-01-03, 74 y.o.   MRN: [id number]  Chief Complaint  Patient presents with  . Vaginitis    HPI Renee Adkins is a 74 y.o. female.  Complains of sore on left vulva that presented 2 weeks ago, and is tender and itchy. HPI  Past Medical History:  Diagnosis Date  . Anxiety 01/25/2012  . Asthma   . Chronic diastolic congestive heart failure (HCC) 01/29/2012  . Hypertension   . Paresthesia 01/27/2012  . Pericarditis   . Seizures (HCC)     Past Surgical History:  Procedure Laterality Date  . COLONOSCOPY  03/2009   repeat due 5-10 years  . DG  BONE DENSITY ([facility] HX)  11/2014   normal  . TOTAL KNEE ARTHROPLASTY  Jan 2011   rt knee  . VESICOVAGINAL FISTULA CLOSURE W/ TAH      Family History  Problem Relation Age of Onset  . Heart attack Father 79  . Heart attack Brother   . Cancer - Lung Brother   . Cancer Sister        liver  . Asthma Neg Hx   . Allergies Neg Hx     Social History Social History   Tobacco Use  . Smoking status: Never Smoker  . Smokeless tobacco: Never Used  Substance Use Topics  . Alcohol use: Yes    Alcohol/week: 0.0 standard drinks    Comment: Socially   . Drug use: No    Allergies  Allergen Reactions  . Azithromycin Shortness Of Breath and Swelling  . Codeine Nausea And Vomiting and Other (See Comments)    REACTION: tachycardia  . Levofloxacin Shortness Of Breath  . Meloxicam Shortness Of Breath  . Penicillins Anaphylaxis  . Naproxen Nausea And Vomiting    Current Outpatient Medications  Medication Sig Dispense Refill  . albuterol (PROVENTIL HFA;VENTOLIN HFA) 108 (90 BASE) MCG/ACT inhaler Inhale 2-4 puffs into the lungs every 4 (four) hours as needed for wheezing or shortness of breath. 1 Inhaler 0  . aspirin EC 81 MG tablet Take 81 mg by mouth every evening.    . budesonide (PULMICORT) 0.5 MG/2ML nebulizer solution Take 0.5 mg by nebulization 2 (two) times daily.     . Cholecalciferol ([facility]  VIT D 5000 HIGH-POTENCY PO) Take 5,000 Units by mouth daily.    . citalopram (CELEXA) 40 MG tablet Take 40 mg by mouth daily.    . cyclobenzaprine (FLEXERIL) 10 MG tablet Take 0.5 tablets (5 mg total) by mouth 3 (three) times daily as needed. For muscle spasms    . diazepam (VALIUM) 5 MG tablet Take 0.5 tablets (2.5 mg total) by mouth every 12 (twelve) hours as needed for anxiety. To relax    . hydrochlorothiazide (HYDRODIURIL) 25 MG tablet Take 12.5 mg by mouth daily.    . Nebivolol HCl 20 MG TABS Take 20 mg by mouth every morning.    . pantoprazole (PROTONIX) 40 MG tablet Take 40 mg by mouth daily.    . polyvinyl alcohol (LIQUIFILM TEARS) 1.4 % ophthalmic solution Place 1 drop into both eyes daily as needed for dry eyes.     . vitamin C (ASCORBIC ACID) 500 MG tablet Take 500 mg by mouth daily.    . potassium chloride SA (K-DUR,KLOR-CON) 20 MEQ tablet Take 20 mEq by mouth daily. (Patient not taking: Reported on 03/02/2020)    . promethazine-dextromethorphan (PROMETHAZINE-DM) 6.25-15 MG/5ML syrup Take 5 mLs by mouth 4 (four) times daily  as needed for cough.    . terconazole (TERAZOL 3) 0.8 % vaginal cream Place 1 applicator vaginally at bedtime. (Patient not taking: Reported on 03/02/2020) 20 g 2  . tinidazole (TINDAMAX) 500 MG tablet Take 2 tablets (1,000 mg total) by mouth daily with breakfast. (Patient not taking: Reported on 03/02/2020) 10 tablet 2  . traMADol (ULTRAM) 50 MG tablet Take 1 tablet (50 mg total) by mouth every 8 (eight) hours as needed for severe pain. (Patient not taking: Reported on 03/02/2020) 20 tablet 0  . zolpidem (AMBIEN) 10 MG tablet 10 mg as needed. (Patient not taking: Reported on 03/02/2020)     No current facility-administered medications for this visit.    Review of Systems Review of Systems Constitutional: negative for fatigue and weight loss Respiratory: negative for cough and wheezing Cardiovascular: negative for chest pain, fatigue and  palpitations Gastrointestinal: negative for abdominal pain and change in bowel habits Genitourinary:positive for vulva ulceration Integument/breast: negative for nipple discharge Musculoskeletal:negative for myalgias Neurological: negative for gait problems and tremors Behavioral/Psych: negative for abusive relationship, depression Endocrine: negative for temperature intolerance      Blood pressure (!) 162/85, pulse 78, weight 170 lb (77.1 kg).  Physical Exam Physical Exam           General: Alert and no distress Abdomen:  normal findings: no organomegaly, soft, non-tender and no hernia  Pelvis:  External genitalia: small ulceration of vulva-left.  Culture done. Urinary system: urethral meatus normal and bladder without fullness, nontender Vaginal: normal without tenderness, induration or masses Cervix: absent Adnexa: normal bimanual exam Uterus: absent    50% of 20 min visit spent on counseling and coordination of care.   Data Reviewed Cultures Wet Prep  Assessment     1. Ulceration of vulva Rx: - Herpes simplex virus culture  2. Vaginal discharge Rx: - Cervicovaginal ancillary only( [facility])    Plan   Follow up prn  Brock Bad, MD 03/02/2020 9:40 AM

## 2020-03-05 LAB — HERPES SIMPLEX VIRUS CULTURE

## 2020-03-24 ENCOUNTER — Ambulatory Visit: Payer: Medicare Other | Admitting: Podiatry

## 2020-04-18 ENCOUNTER — Ambulatory Visit: Payer: Medicare Other

## 2020-04-18 ENCOUNTER — Ambulatory Visit: Payer: Medicare Other | Admitting: Podiatry

## 2020-04-18 ENCOUNTER — Ambulatory Visit (INDEPENDENT_AMBULATORY_CARE_PROVIDER_SITE_OTHER): Payer: Medicare Other

## 2020-04-18 ENCOUNTER — Other Ambulatory Visit: Payer: Self-pay

## 2020-04-18 DIAGNOSIS — M2041 Other hammer toe(s) (acquired), right foot: Secondary | ICD-10-CM | POA: Diagnosis not present

## 2020-04-18 DIAGNOSIS — M2011 Hallux valgus (acquired), right foot: Secondary | ICD-10-CM | POA: Diagnosis not present

## 2020-04-18 DIAGNOSIS — M79674 Pain in right toe(s): Secondary | ICD-10-CM

## 2020-04-18 DIAGNOSIS — M21611 Bunion of right foot: Secondary | ICD-10-CM

## 2020-04-18 NOTE — Progress Notes (Signed)
Subjective:  Patient ID: Renee Adkins, female    DOB: 1945/11/09,  MRN: [id number]  Chief Complaint  Patient presents with  . Bunions    Pt stated that its hard to put shoes on and walk    74 y.o. female presents with the above complaint. States it's hard to put on a shoe and walk.  Objective:  Physical Exam: warm, good capillary refill, no trophic changes or ulcerative lesions, normal DP and PT pulses and normal sensory exam. Left Foot: HAV deformity, mild, no POP. Left hallux nail longitudinal melanonychia wihtout Hutchinson's sign  Right Foot: HAV deformity, mod, with HPK lateral hallux, medial 2nd toe   Radiographs: HAV deformity bilat no acute fractures or dislocations. Assessment:   1. Hallux valgus with bunions of right foot   2. Hammer toe of right foot   3. Pain in toe of right foot      Plan:  Patient was evaluated and treated and all questions answered.  Bunion and Hammertoe -Discussed surgical intervention as she has failed conservative therapy. Surgical options discussed. Discussed post-op course. -Patient has failed all conservative therapy and wishes to proceed with surgical intervention. All risks, benefits, and alternatives discussed with patient. No guarantees given. Consent reviewed and signed by patient. -Planned procedures: [place] bunionectomy right foot, right 2nd hammertoe correction  No follow-ups on file.

## 2020-05-05 ENCOUNTER — Telehealth: Payer: Self-pay

## 2020-05-05 NOTE — Telephone Encounter (Signed)
Ms. Gehlhausen called and needed to cancel her surgery with Dr. Samuella Cota on 06/14/2020. She stated her daughter will not be able to come help her at that time. She will call back to reschedule once her daughter has the time available. Notified Dr. Samuella Cota and left a message for Aram Beecham at [facility]

## 2020-05-05 NOTE — Telephone Encounter (Signed)
Noted thanks °

## 2020-06-20 ENCOUNTER — Encounter: Payer: Medicare Other | Admitting: Podiatry

## 2020-06-27 ENCOUNTER — Encounter: Payer: Medicare Other | Admitting: Podiatry

## 2020-07-11 ENCOUNTER — Encounter: Payer: Medicare Other | Admitting: Podiatry

## 2020-12-04 DIAGNOSIS — R053 Chronic cough: Secondary | ICD-10-CM | POA: Diagnosis not present

## 2020-12-04 DIAGNOSIS — E46 Unspecified protein-calorie malnutrition: Secondary | ICD-10-CM | POA: Diagnosis not present

## 2020-12-04 DIAGNOSIS — K219 Gastro-esophageal reflux disease without esophagitis: Secondary | ICD-10-CM | POA: Diagnosis not present

## 2020-12-04 DIAGNOSIS — N1831 Chronic kidney disease, stage 3a: Secondary | ICD-10-CM | POA: Diagnosis not present

## 2020-12-04 DIAGNOSIS — G4700 Insomnia, unspecified: Secondary | ICD-10-CM | POA: Diagnosis not present

## 2020-12-04 DIAGNOSIS — I10 Essential (primary) hypertension: Secondary | ICD-10-CM | POA: Diagnosis not present

## 2020-12-04 DIAGNOSIS — M545 Low back pain, unspecified: Secondary | ICD-10-CM | POA: Diagnosis not present

## 2020-12-04 DIAGNOSIS — J45909 Unspecified asthma, uncomplicated: Secondary | ICD-10-CM | POA: Diagnosis not present

## 2021-02-02 DIAGNOSIS — M25551 Pain in right hip: Secondary | ICD-10-CM | POA: Diagnosis not present

## 2021-02-22 DIAGNOSIS — M47816 Spondylosis without myelopathy or radiculopathy, lumbar region: Secondary | ICD-10-CM | POA: Diagnosis not present

## 2021-02-27 ENCOUNTER — Other Ambulatory Visit: Payer: Self-pay

## 2021-02-27 ENCOUNTER — Ambulatory Visit
Admission: RE | Admit: 2021-02-27 | Discharge: 2021-02-27 | Disposition: A | Discharge status: Home / Self Care | Payer: Medicare Other | Source: Ambulatory Visit | Attending: Physician Assistant | Admitting: Physician Assistant

## 2021-02-27 ENCOUNTER — Other Ambulatory Visit: Payer: Self-pay | Admitting: Physician Assistant

## 2021-02-27 DIAGNOSIS — A491 Streptococcal infection, unspecified site: Secondary | ICD-10-CM

## 2021-02-27 DIAGNOSIS — R918 Other nonspecific abnormal finding of lung field: Secondary | ICD-10-CM | POA: Diagnosis not present

## 2021-02-27 DIAGNOSIS — R053 Chronic cough: Secondary | ICD-10-CM | POA: Diagnosis not present

## 2021-02-27 DIAGNOSIS — J984 Other disorders of lung: Secondary | ICD-10-CM | POA: Diagnosis not present

## 2021-03-12 DIAGNOSIS — H40013 Open angle with borderline findings, low risk, bilateral: Secondary | ICD-10-CM | POA: Diagnosis not present

## 2021-03-13 DIAGNOSIS — S299XXA Unspecified injury of thorax, initial encounter: Secondary | ICD-10-CM | POA: Diagnosis not present

## 2021-03-13 DIAGNOSIS — N1831 Chronic kidney disease, stage 3a: Secondary | ICD-10-CM | POA: Diagnosis not present

## 2021-03-13 DIAGNOSIS — W19XXXA Unspecified fall, initial encounter: Secondary | ICD-10-CM | POA: Diagnosis not present

## 2021-03-15 ENCOUNTER — Other Ambulatory Visit: Payer: Self-pay | Admitting: Physician Assistant

## 2021-03-15 ENCOUNTER — Other Ambulatory Visit: Payer: Self-pay

## 2021-03-15 ENCOUNTER — Ambulatory Visit
Admission: RE | Admit: 2021-03-15 | Discharge: 2021-03-15 | Disposition: A | Discharge status: Home / Self Care | Payer: Medicare Other | Source: Ambulatory Visit | Attending: Physician Assistant | Admitting: Physician Assistant

## 2021-03-15 DIAGNOSIS — J189 Pneumonia, unspecified organism: Secondary | ICD-10-CM

## 2021-03-15 DIAGNOSIS — R0602 Shortness of breath: Secondary | ICD-10-CM | POA: Diagnosis not present

## 2021-04-23 ENCOUNTER — Other Ambulatory Visit: Payer: Self-pay

## 2021-04-23 ENCOUNTER — Ambulatory Visit: Payer: Medicare Other | Admitting: Pulmonary Disease

## 2021-04-23 ENCOUNTER — Ambulatory Visit (INDEPENDENT_AMBULATORY_CARE_PROVIDER_SITE_OTHER): Payer: Medicare Other

## 2021-04-23 ENCOUNTER — Encounter: Payer: Self-pay | Admitting: Pulmonary Disease

## 2021-04-23 VITALS — BP 138/70 | HR 95 | Temp 98.6°F | Ht 61.0 in | Wt 143.6 lb

## 2021-04-23 DIAGNOSIS — J181 Lobar pneumonia, unspecified organism: Secondary | ICD-10-CM

## 2021-04-23 DIAGNOSIS — R0600 Dyspnea, unspecified: Secondary | ICD-10-CM | POA: Diagnosis not present

## 2021-04-23 DIAGNOSIS — R0609 Other forms of dyspnea: Secondary | ICD-10-CM

## 2021-04-23 DIAGNOSIS — J439 Emphysema, unspecified: Secondary | ICD-10-CM | POA: Diagnosis not present

## 2021-04-23 MED ORDER — ARFORMOTEROL TARTRATE 15 MCG/2ML IN NEBU: 15.0000 ug | INHALATION_SOLUTION | Freq: Two times a day (BID) | RESPIRATORY_TRACT | 6 refills | Status: DC

## 2021-04-23 NOTE — Patient Instructions (Signed)
Nice to meet you  I think we should repeat a chest x-ray today to document ongoing improvement from when you had pneumonia in July 2022.  I have ordered PFTs or pulmonary function test to further evaluate why you are short of breath.  These can be scheduled at your convenience.  Please add arformoterol twice a day to your nebulizer routine.  Continue the budesonide nebulized twice a day.  Hopefully the addition of offer will improve your breathing and reduce your reliance on albuterol.  Please keep track of how often you are needing to use albuterol after starting the arformoterol nebulizer.  Return to clinic in 3 months or sooner as needed with Dr. Judeth Horn

## 2021-04-24 DIAGNOSIS — M545 Low back pain, unspecified: Secondary | ICD-10-CM | POA: Diagnosis not present

## 2021-04-27 ENCOUNTER — Other Ambulatory Visit: Payer: Self-pay

## 2021-04-27 MED ORDER — ARFORMOTEROL TARTRATE 15 MCG/2ML IN NEBU: 15.0000 ug | INHALATION_SOLUTION | Freq: Two times a day (BID) | RESPIRATORY_TRACT | 6 refills | Status: DC

## 2021-05-02 ENCOUNTER — Other Ambulatory Visit: Payer: Self-pay

## 2021-05-02 ENCOUNTER — Ambulatory Visit: Payer: Medicare Other | Admitting: Pulmonary Disease

## 2021-05-02 DIAGNOSIS — R0600 Dyspnea, unspecified: Secondary | ICD-10-CM

## 2021-05-02 DIAGNOSIS — R0609 Other forms of dyspnea: Secondary | ICD-10-CM

## 2021-05-02 LAB — PULMONARY FUNCTION TEST
DL/VA % pred: 70 %
DL/VA: 2.96 ml/min/mmHg/L
DLCO cor % pred: 56 %
DLCO cor: 9.62 ml/min/mmHg
DLCO unc % pred: 56 %
DLCO unc: 9.62 ml/min/mmHg
FEF 25-75 Post: 1.15 L/sec
FEF 25-75 Pre: 0.78 L/sec
FEF2575-%Change-Post: 48 %
FEF2575-%Pred-Post: 91 %
FEF2575-%Pred-Pre: 61 %
FEV1-%Change-Post: 11 %
FEV1-%Pred-Post: 102 %
FEV1-%Pred-Pre: 92 %
FEV1-Post: 1.45 L
FEV1-Pre: 1.31 L
FEV1FVC-%Change-Post: 2 %
FEV1FVC-%Pred-Pre: 89 %
FEV6-%Change-Post: 7 %
FEV6-%Pred-Post: 117 %
FEV6-%Pred-Pre: 109 %
FEV6-Post: 2.05 L
FEV6-Pre: 1.91 L
FEV6FVC-%Pred-Post: 105 %
FEV6FVC-%Pred-Pre: 105 %
FVC-%Change-Post: 8 %
FVC-%Pred-Post: 112 %
FVC-%Pred-Pre: 104 %
FVC-Post: 2.08 L
FVC-Pre: 1.91 L
Post FEV1/FVC ratio: 70 %
Post FEV6/FVC ratio: 100 %
Pre FEV1/FVC ratio: 68 %
Pre FEV6/FVC Ratio: 100 %
RV % pred: 168 %
RV: 3.64 L
TLC % pred: 121 %
TLC: 5.58 L

## 2021-05-02 MED ORDER — ARFORMOTEROL TARTRATE 15 MCG/2ML IN NEBU: 15.0000 ug | INHALATION_SOLUTION | Freq: Two times a day (BID) | RESPIRATORY_TRACT | 6 refills | Status: DC

## 2021-05-02 NOTE — Patient Instructions (Signed)
Full PFT performed today. °

## 2021-05-02 NOTE — Progress Notes (Signed)
Full PFT performed today. °

## 2021-05-07 NOTE — Progress Notes (Signed)
@Patient  ID: Renee Adkins, female    DOB: Sep 10, 1945, 75 y.o.   MRN: [id number]  Chief Complaint  Patient presents with   Consult    Pt states had pneumonia referred because they think pneumonia damaged lungs.    Referring provider: Elias Else, MD  HPI:   75 y.o. whom we are seeing in consultation for evaluation of pneumonia and dyspnea on exertion.  PCP note reviewed.  Patient has been diagnosed with pneumonia 02/2021 based on chest x-ray.  At that time she had fever, productive cough, mild dyspnea on exertion.  She recalls being sick to her stomach, vomiting in hours to days preceding symptoms.  Seen by her PCP.  Chest x-ray obtained 02/27/2021 on my review interpretation reveals right basilar left lower hazy infiltrate.  She received antibiotics.  Symptoms improved with still had some ongoing shortness of breath.  Repeat chest x-ray 03/15/2021 reviewed and on my interpretation showed improved bibasilar infiltrates with persistent linear infiltrate favored to represent atelectasis versus evolving or improving pneumonia.  She has dyspnea on exertion.  Present with inclines or stairs.  No time of day when things are better or worse.  No positional things are better or worse.  No seasonal environmental factors that she can identify to make things better or worse.  She uses her albuterol as needed with improvement.  Using about 3 times a week, sometimes more.  She continues on budesonide nebs twice daily.  No other alleviating or exacerbating factors.  PMH: Asthma, GERD, hypertension Surgical history: Knee replacement, vesicovaginal fistula closure Family history: CAD and father, CAD in brother, lung cancer in brother Social history: Never smoker, lives in [place] / Pulmonary Flowsheets:   ACT:  Asthma Control Test ACT Total Score  04/23/2021 8    MMRC: No flowsheet data found.  Epworth:  No flowsheet data found.  Tests:   FENO:  No results found for:  NITRICOXIDE  PFT: PFT Results Latest Ref Rng & Units 05/02/2021  FVC-Pre L 1.91  FVC-Predicted Pre % 104  FVC-Post L 2.08  FVC-Predicted Post % 112  Pre FEV1/FVC % % 68  Post FEV1/FCV % % 70  FEV1-Pre L 1.31  FEV1-Predicted Pre % 92  FEV1-Post L 1.45  DLCO uncorrected ml/min/mmHg 9.62  DLCO UNC% % 56  DLCO corrected ml/min/mmHg 9.62  DLCO COR %Predicted % 56  DLVA Predicted % 70  TLC L 5.58  TLC % Predicted % 121  RV % Predicted % 168    WALK:  No flowsheet data found.  Imaging: Personally reviewed and as per EMR discussion this note DG Chest 2 View  Result Date: 04/24/2021 CLINICAL DATA:  75 year old female with dyspnea on exertion EXAM: CHEST - 2 VIEW COMPARISON:  03/15/2021 FINDINGS: Cardiomediastinal silhouette unchanged in size and contour. No evidence of central vascular congestion. No interlobular septal thickening. Improved aeration at the bilateral lung bases compared to the most recent chest x-ray, with minimal linear opacities at right greater than left lung base. Stigmata of emphysema, with increased retrosternal airspace, flattened hemidiaphragms, increased AP diameter, and hyperinflation on the AP view. No pneumothorax or pleural effusion. Coarsened interstitial markings, with no confluent airspace disease. No acute displaced fracture. Degenerative changes of the spine. IMPRESSION: Near complete resolution of airspace opacities at the bilateral lung bases, with background emphysema Electronically Signed   By: Gilmer Mor D.O.   On: 04/24/2021 09:15    Lab Results: Personally reviewed, notably eosinophils elevated in the past CBC  Component Value Date/Time   WBC 10.0 04/21/2016 0901   RBC 4.30 04/21/2016 0901   HGB 13.2 04/21/2016 0901   HCT 39.8 04/21/2016 0901   PLT 158 04/21/2016 0901   MCV 92.6 04/21/2016 0901   MCH 30.7 04/21/2016 0901   MCHC 33.2 04/21/2016 0901   RDW 13.4 04/21/2016 0901   LYMPHSABS 2.5 04/20/2016 0850   MONOABS 0.4 04/20/2016  0850   EOSABS 0.4 04/20/2016 0850   BASOSABS 0.0 04/20/2016 0850    BMET    Component Value Date/Time   NA 138 04/21/2016 0901   K 4.1 04/21/2016 0901   CL 108 04/21/2016 0901   CO2 24 04/21/2016 0901   GLUCOSE 164 (H) 04/21/2016 0901   BUN 7 04/21/2016 0901   CREATININE 0.95 04/21/2016 0901   CALCIUM 8.8 (L) 04/21/2016 0901   GFRNONAA 59 (L) 04/21/2016 0901   GFRAA >60 04/21/2016 0901    BNP    Component Value Date/Time   BNP 38.1 04/20/2016 0850    ProBNP    Component Value Date/Time   PROBNP 59.4 11/10/2013 1632    Specialty Problems       Pulmonary Problems   COUGH    Qualifier: Diagnosis of  By: Sherene Sires MD, Casimiro Needle B       Asthma   Hypoxia    Allergies  Allergen Reactions   Azithromycin Shortness Of Breath and Swelling   Codeine Nausea And Vomiting and Other (See Comments)    REACTION: tachycardia   Levofloxacin Shortness Of Breath   Meloxicam Shortness Of Breath   Penicillins Anaphylaxis   Naproxen Nausea And Vomiting    Immunization History  Administered Date(s) Administered   PFIZER(Purple Top)SARS-COV-2 Vaccination 09/16/2019, 10/07/2019   Tdap 05/17/2014    Past Medical History:  Diagnosis Date   Anxiety 01/25/2012   Asthma    Chronic diastolic congestive heart failure (HCC) 01/29/2012   Hypertension    Paresthesia 01/27/2012   Pericarditis    Seizures (HCC)     Tobacco History: Social History   Tobacco Use  Smoking Status Never  Smokeless Tobacco Never   Counseling given: Not Answered   Continue to not smoke  Outpatient Encounter Medications as of 04/23/2021  Medication Sig   albuterol (PROVENTIL HFA;VENTOLIN HFA) 108 (90 BASE) MCG/ACT inhaler Inhale 2-4 puffs into the lungs every 4 (four) hours as needed for wheezing or shortness of breath.   aspirin EC 81 MG tablet Take 81 mg by mouth every evening.   budesonide (PULMICORT) 0.5 MG/2ML nebulizer solution Take 0.5 mg by nebulization 2 (two) times daily.    Cholecalciferol  ([facility]S VIT D 5000 HIGH-POTENCY PO) Take 5,000 Units by mouth daily.   citalopram (CELEXA) 40 MG tablet Take 40 mg by mouth daily.   hydrochlorothiazide (HYDRODIURIL) 25 MG tablet Take 12.5 mg by mouth daily.   pantoprazole (PROTONIX) 40 MG tablet Take 40 mg by mouth daily.   potassium chloride SA (K-DUR,KLOR-CON) 20 MEQ tablet Take 20 mEq by mouth daily.   promethazine-dextromethorphan (PROMETHAZINE-DM) 6.25-15 MG/5ML syrup Take 5 mLs by mouth 4 (four) times daily as needed for cough.   vitamin C (ASCORBIC ACID) 500 MG tablet Take 500 mg by mouth daily.   zolpidem (AMBIEN) 10 MG tablet 10 mg as needed.   [DISCONTINUED] arformoterol (BROVANA) 15 MCG/2ML NEBU Take 2 mLs (15 mcg total) by nebulization 2 (two) times daily.   cyclobenzaprine (FLEXERIL) 10 MG tablet Take 0.5 tablets (5 mg total) by mouth 3 (three) times daily as needed. For  muscle spasms (Patient not taking: Reported on 04/23/2021)   diazepam (VALIUM) 5 MG tablet Take 0.5 tablets (2.5 mg total) by mouth every 12 (twelve) hours as needed for anxiety. To relax (Patient not taking: Reported on 04/23/2021)   Nebivolol HCl 20 MG TABS Take 20 mg by mouth every morning. (Patient not taking: Reported on 04/23/2021)   polyvinyl alcohol (LIQUIFILM TEARS) 1.4 % ophthalmic solution Place 1 drop into both eyes daily as needed for dry eyes.  (Patient not taking: Reported on 04/23/2021)   terconazole (TERAZOL 3) 0.8 % vaginal cream Place 1 applicator vaginally at bedtime. (Patient not taking: No sig reported)   tinidazole (TINDAMAX) 500 MG tablet Take 2 tablets (1,000 mg total) by mouth daily with breakfast. (Patient not taking: No sig reported)   traMADol (ULTRAM) 50 MG tablet Take 1 tablet (50 mg total) by mouth every 8 (eight) hours as needed for severe pain. (Patient not taking: No sig reported)   No facility-administered encounter medications on file as of 04/23/2021.     Review of Systems  Review of Systems  No chest pain with exertion.  No  orthopnea or PND.  No worsening lower extremity swelling.  Comprehensive review of systems otherwise negative. Physical Exam  BP 138/70 (BP Location: Right Arm, Patient Position: Sitting, Cuff Size: Normal)   Pulse 95   Temp 98.6 F (37 C) (Oral)   Ht 5\' 1"  (1.549 m)   Wt 143 lb 9.6 oz (65.1 kg)   SpO2 96%   BMI 27.13 kg/m   Wt Readings from Last 5 Encounters:  04/23/21 143 lb 9.6 oz (65.1 kg)  03/02/20 170 lb (77.1 kg)  01/06/20 159 lb 3.2 oz (72.2 kg)  07/16/17 148 lb (67.1 kg)  08/20/16 145 lb 9.6 oz (66 kg)    BMI Readings from Last 5 Encounters:  04/23/21 27.13 kg/m  03/02/20 34.34 kg/m  01/06/20 32.15 kg/m  07/16/17 29.89 kg/m  08/20/16 27.51 kg/m     Physical Exam General: Sitting in chair, in no acute distress Eyes: EOMI, icterus Neck: Supple, no JVP Pulmonary: Clear, normal work of breathing Cardiovascular: Regular rate and rhythm, no edema, warm Abdomen: Soft, bowel sounds present MSK: No synovitis, no joint effusion Neuro: Normal gait, no weakness Psych: Normal mood, full affect   Assessment & Plan:   Pneumonia: Suspect aspiration as was nauseated, some vomiting.  Chest x-ray 7/19 right basilar left lower hazy infiltrate that improved on chest x-ray 03/15/2021 but not totally resolved on my review interpretation.  Symptoms improved.  Repeat chest x-ray today to assess for ongoing improvement/resolution.  Dyspnea on exertion: Suspect multifactorial related to deconditioning, possible poorly controlled asthma.  Using albuterol frequently with temporary improvement in symptoms.  Currently using budesonide neb twice daily.  Add arformoterol neb twice daily to provide ICS/LABA therapy.  Hopefully this will reduce albuterol use and improve symptoms.  PFTs also ordered for further evaluation.   Return in about 3 months (around 07/23/2021).   Karren Burly, MD 05/07/2021

## 2021-05-31 ENCOUNTER — Other Ambulatory Visit (HOSPITAL_COMMUNITY): Payer: Self-pay

## 2021-05-31 ENCOUNTER — Telehealth: Payer: Self-pay | Admitting: Pharmacy Technician

## 2021-05-31 NOTE — Telephone Encounter (Signed)
Patient Advocate Encounter  Received notification from [employer] that prior authorization for [facility] 15MCG/2ML is required.   PA submitted on 10.20.22 Key [id number] Status is pending   [facility] will continue to follow  Ricke Hey, CPhT Patient Advocate Phone: [phone] Fax:  [phone]

## 2021-06-01 ENCOUNTER — Other Ambulatory Visit (HOSPITAL_COMMUNITY): Payer: Self-pay

## 2021-06-01 NOTE — Telephone Encounter (Signed)
Received a fax regarding Prior Authorization from [employer] for BROVANA. Authorization has been DENIED because:  nebulizer solutions given with a nebulizer machine are covered under Part D   IF  you live in a long-term care facility and under Part B if you do not live in a long-term care facility.

## 2021-06-01 NOTE — Telephone Encounter (Signed)
[employer] does not file part B. Will need to send to either [facility] or [facility].   Called patient but she did not answer. Left a message for her to call us back to see which pharmacy she would like for [place] to send the RX to.

## 2021-06-30 DIAGNOSIS — Z1231 Encounter for screening mammogram for malignant neoplasm of breast: Secondary | ICD-10-CM | POA: Diagnosis not present

## 2021-07-06 ENCOUNTER — Other Ambulatory Visit (HOSPITAL_COMMUNITY): Payer: Self-pay

## 2021-08-02 DIAGNOSIS — I10 Essential (primary) hypertension: Secondary | ICD-10-CM | POA: Diagnosis not present

## 2021-08-02 DIAGNOSIS — Z0000 Encounter for general adult medical examination without abnormal findings: Secondary | ICD-10-CM | POA: Diagnosis not present

## 2021-08-02 DIAGNOSIS — M545 Low back pain, unspecified: Secondary | ICD-10-CM | POA: Diagnosis not present

## 2021-08-02 DIAGNOSIS — G4700 Insomnia, unspecified: Secondary | ICD-10-CM | POA: Diagnosis not present

## 2021-08-02 DIAGNOSIS — Z1389 Encounter for screening for other disorder: Secondary | ICD-10-CM | POA: Diagnosis not present

## 2021-08-02 DIAGNOSIS — R053 Chronic cough: Secondary | ICD-10-CM | POA: Diagnosis not present

## 2021-08-02 DIAGNOSIS — K219 Gastro-esophageal reflux disease without esophagitis: Secondary | ICD-10-CM | POA: Diagnosis not present

## 2021-08-02 DIAGNOSIS — N1831 Chronic kidney disease, stage 3a: Secondary | ICD-10-CM | POA: Diagnosis not present

## 2021-08-02 DIAGNOSIS — J45909 Unspecified asthma, uncomplicated: Secondary | ICD-10-CM | POA: Diagnosis not present

## 2021-08-28 DIAGNOSIS — N1831 Chronic kidney disease, stage 3a: Secondary | ICD-10-CM | POA: Diagnosis not present

## 2021-08-29 ENCOUNTER — Encounter: Payer: Self-pay | Admitting: Obstetrics

## 2021-08-29 ENCOUNTER — Ambulatory Visit: Payer: Medicare Other | Admitting: Obstetrics

## 2021-08-29 ENCOUNTER — Other Ambulatory Visit: Payer: Self-pay

## 2021-08-29 ENCOUNTER — Other Ambulatory Visit (HOSPITAL_COMMUNITY)
Admission: RE | Admit: 2021-08-29 | Discharge: 2021-08-29 | Disposition: A | Discharge status: Home / Self Care | Payer: Medicare Other | Source: Ambulatory Visit | Attending: Obstetrics | Admitting: Obstetrics

## 2021-08-29 VITALS — BP 157/72 | HR 71 | Wt 142.0 lb

## 2021-08-29 DIAGNOSIS — N898 Other specified noninflammatory disorders of vagina: Secondary | ICD-10-CM

## 2021-08-29 DIAGNOSIS — F419 Anxiety disorder, unspecified: Secondary | ICD-10-CM

## 2021-08-29 NOTE — Progress Notes (Signed)
Patient ID: Renee Adkins, female   DOB: 11/12/1945, 76 y.o.   MRN: [id number]  Chief Complaint  Patient presents with   Gynecologic Exam    HPI Renee Adkins is a 76 y.o. female.  Complains of vaginal discharge with burning and irritation.  Denies vaginal odor. HPI  Past Medical History:  Diagnosis Date   Anxiety 01/25/2012   Asthma    Chronic diastolic congestive heart failure (HCC) 01/29/2012   Hypertension    Paresthesia 01/27/2012   Pericarditis    Seizures (HCC)     Past Surgical History:  Procedure Laterality Date   COLONOSCOPY  03/2009   repeat due 5-10 years   DG  BONE DENSITY ([facility] HX)  11/2014   normal   TOTAL KNEE ARTHROPLASTY  Jan 2011   rt knee   VESICOVAGINAL FISTULA CLOSURE W/ TAH      Family History  Problem Relation Age of Onset   Heart attack Father 66   Heart attack Brother    Cancer - Lung Brother    Cancer Sister        liver   Asthma Neg Hx    Allergies Neg Hx     Social History Social History   Tobacco Use   Smoking status: Never   Smokeless tobacco: Never  Substance Use Topics   Alcohol use: Yes    Alcohol/week: 0.0 standard drinks    Comment: Socially    Drug use: No    Allergies  Allergen Reactions   Azithromycin Shortness Of Breath and Swelling   Codeine Nausea And Vomiting and Other (See Comments)    REACTION: tachycardia   Levofloxacin Shortness Of Breath   Meloxicam Shortness Of Breath   Penicillins Anaphylaxis   Naproxen Nausea And Vomiting    Current Outpatient Medications  Medication Sig Dispense Refill   albuterol (PROVENTIL HFA;VENTOLIN HFA) 108 (90 BASE) MCG/ACT inhaler Inhale 2-4 puffs into the lungs every 4 (four) hours as needed for wheezing or shortness of breath. 1 Inhaler 0   ALPRAZolam (XANAX) 0.25 MG tablet Take 0.25 mg by mouth at bedtime as needed for anxiety.     arformoterol (BROVANA) 15 MCG/2ML NEBU Take 2 mLs (15 mcg total) by nebulization 2 (two) times daily. 120 mL 6   aspirin EC 81 MG tablet  Take 81 mg by mouth every evening.     budesonide (PULMICORT) 0.5 MG/2ML nebulizer solution Take 0.5 mg by nebulization 2 (two) times daily.      Cholecalciferol ([employer] VIT D 5000 HIGH-POTENCY PO) Take 5,000 Units by mouth daily.     citalopram (CELEXA) 40 MG tablet Take 40 mg by mouth daily.     cyclobenzaprine (FLEXERIL) 10 MG tablet Take 0.5 tablets (5 mg total) by mouth 3 (three) times daily as needed. For muscle spasms     hydrochlorothiazide (HYDRODIURIL) 25 MG tablet Take 12.5 mg by mouth daily.     pantoprazole (PROTONIX) 40 MG tablet Take 40 mg by mouth daily.     potassium chloride SA (K-DUR,KLOR-CON) 20 MEQ tablet Take 20 mEq by mouth daily.     promethazine-dextromethorphan (PROMETHAZINE-DM) 6.25-15 MG/5ML syrup Take 5 mLs by mouth 4 (four) times daily as needed for cough.     traMADol (ULTRAM) 50 MG tablet Take 1 tablet (50 mg total) by mouth every 8 (eight) hours as needed for severe pain. 20 tablet 0   vitamin C (ASCORBIC ACID) 500 MG tablet Take 500 mg by mouth daily.     zolpidem (AMBIEN)  10 MG tablet 10 mg as needed.     Nebivolol HCl 20 MG TABS Take 20 mg by mouth every morning. (Patient not taking: Reported on 04/23/2021)     polyvinyl alcohol (LIQUIFILM TEARS) 1.4 % ophthalmic solution Place 1 drop into both eyes daily as needed for dry eyes.  (Patient not taking: Reported on 04/23/2021)     No current facility-administered medications for this visit.    Review of Systems Review of Systems Constitutional: negative for fatigue and weight loss Respiratory: negative for cough and wheezing Cardiovascular: negative for chest pain, fatigue and palpitations Gastrointestinal: negative for abdominal pain and change in bowel habits Genitourinary:positive for vaginal discharge Integument/breast: negative for nipple discharge Musculoskeletal:negative for myalgias Neurological: negative for gait problems and tremors Behavioral/Psych: negative for abusive relationship,  depression Endocrine: negative for temperature intolerance      Blood pressure (!) 157/72, pulse 71, weight 142 lb (64.4 kg).  Physical Exam Physical Exam General:   Alert and no distress  Skin:   no rash or abnormalities  Lungs:   clear to auscultation bilaterally  Heart:   regular rate and rhythm, S1, S2 normal, no murmur, click, rub or gallop  Breasts:   normal without suspicious masses, skin or nipple changes or axillary nodes  Abdomen:  normal findings: no organomegaly, soft, non-tender and no hernia  Pelvis:  External genitalia: normal general appearance Urinary system: urethral meatus normal and bladder without fullness, nontender Vaginal: normal without tenderness, induration or masses Cervix: absent Adnexa: absent Uterus: absent    I have spent a total of 20 minutes of face-to-face time, excluding clinical staff time, reviewing notes and preparing to see patient, ordering tests and/or medications, and counseling the patient.   Data Reviewed Wet  Prep  Assessment     1. Vaginal discharge Rx: - Cervicovaginal ancillary only( [place])  2. Anxiety Rx: - Ambulatory referral to Psychiatry  3. Vaginal irritation     Plan   Follow up prn   Orders Placed This Encounter  Procedures   Ambulatory referral to Psychiatry    Referral Priority:   Routine    Referral Type:   Psychiatric    Referral Reason:   Specialty Services Required    Requested Specialty:   Psychiatry    Number of Visits Requested:   1     Brock Bad, MD, FACOG Attending Obstetrician & Gynecologist, [facility] for [facility], [facility], fEMINA 08/29/2021

## 2021-08-30 LAB — CERVICOVAGINAL ANCILLARY ONLY
Bacterial Vaginitis (gardnerella): NEGATIVE
Candida Glabrata: POSITIVE — AB
Candida Vaginitis: NEGATIVE
Comment: NEGATIVE
Comment: NEGATIVE
Comment: NEGATIVE

## 2021-09-03 ENCOUNTER — Telehealth: Payer: Self-pay

## 2021-09-03 ENCOUNTER — Other Ambulatory Visit: Payer: Self-pay | Admitting: Obstetrics

## 2021-09-03 DIAGNOSIS — B379 Candidiasis, unspecified: Secondary | ICD-10-CM

## 2021-09-03 MED ORDER — TERCONAZOLE 0.4 % VA CREA: 1.0000 | TOPICAL_CREAM | Freq: Every day | VAGINAL | 0 refills | Status: DC

## 2021-09-03 NOTE — Telephone Encounter (Signed)
S/w patient and advised of results and rx sent  ?

## 2021-09-25 ENCOUNTER — Telehealth: Payer: Self-pay | Admitting: Emergency Medicine

## 2021-09-25 ENCOUNTER — Other Ambulatory Visit: Payer: Self-pay | Admitting: Obstetrics

## 2021-09-25 DIAGNOSIS — B379 Candidiasis, unspecified: Secondary | ICD-10-CM

## 2021-09-25 MED ORDER — AZO BORIC ACID 600 MG VA SUPP: 1.0000 | Freq: Every day | VAGINAL | 0 refills | Status: DC

## 2021-09-25 NOTE — Telephone Encounter (Signed)
Patient called reporting continued symptoms of yeast after completing treatment. Patient is requesting additional treatment.  Discussed symptoms and treatment with Dr. Jodi Mourning.   Rx for boric acid has been sent to [facility] for patient.  Left detailed voicemail informing patient of new prescription and to call back if she has any additional questions.

## 2022-01-31 DIAGNOSIS — I10 Essential (primary) hypertension: Secondary | ICD-10-CM | POA: Diagnosis not present

## 2022-01-31 DIAGNOSIS — J45909 Unspecified asthma, uncomplicated: Secondary | ICD-10-CM | POA: Diagnosis not present

## 2022-01-31 DIAGNOSIS — N1831 Chronic kidney disease, stage 3a: Secondary | ICD-10-CM | POA: Diagnosis not present

## 2022-01-31 DIAGNOSIS — R053 Chronic cough: Secondary | ICD-10-CM | POA: Diagnosis not present

## 2022-01-31 DIAGNOSIS — G4700 Insomnia, unspecified: Secondary | ICD-10-CM | POA: Diagnosis not present

## 2022-01-31 DIAGNOSIS — Z1211 Encounter for screening for malignant neoplasm of colon: Secondary | ICD-10-CM | POA: Diagnosis not present

## 2022-01-31 DIAGNOSIS — K219 Gastro-esophageal reflux disease without esophagitis: Secondary | ICD-10-CM | POA: Diagnosis not present

## 2022-02-28 DIAGNOSIS — N1831 Chronic kidney disease, stage 3a: Secondary | ICD-10-CM | POA: Diagnosis not present

## 2022-07-17 DIAGNOSIS — H40013 Open angle with borderline findings, low risk, bilateral: Secondary | ICD-10-CM | POA: Diagnosis not present

## 2022-09-11 DIAGNOSIS — H401131 Primary open-angle glaucoma, bilateral, mild stage: Secondary | ICD-10-CM | POA: Diagnosis not present

## 2022-09-11 DIAGNOSIS — H25811 Combined forms of age-related cataract, right eye: Secondary | ICD-10-CM | POA: Diagnosis not present

## 2022-09-13 DIAGNOSIS — R053 Chronic cough: Secondary | ICD-10-CM | POA: Diagnosis not present

## 2022-09-13 DIAGNOSIS — K219 Gastro-esophageal reflux disease without esophagitis: Secondary | ICD-10-CM | POA: Diagnosis not present

## 2022-09-13 DIAGNOSIS — J45909 Unspecified asthma, uncomplicated: Secondary | ICD-10-CM | POA: Diagnosis not present

## 2022-09-13 DIAGNOSIS — R600 Localized edema: Secondary | ICD-10-CM | POA: Diagnosis not present

## 2022-09-13 DIAGNOSIS — I10 Essential (primary) hypertension: Secondary | ICD-10-CM | POA: Diagnosis not present

## 2022-09-13 DIAGNOSIS — G4700 Insomnia, unspecified: Secondary | ICD-10-CM | POA: Diagnosis not present

## 2022-09-24 DIAGNOSIS — M5416 Radiculopathy, lumbar region: Secondary | ICD-10-CM | POA: Diagnosis not present

## 2022-09-27 ENCOUNTER — Encounter (HOSPITAL_COMMUNITY): Payer: Self-pay

## 2022-09-27 ENCOUNTER — Emergency Department (HOSPITAL_COMMUNITY)
Admission: EM | Admit: 2022-09-27 | Discharge: 2022-09-27 | Disposition: A | Discharge status: Home / Self Care | Payer: Medicare Other | Attending: Emergency Medicine | Admitting: Emergency Medicine

## 2022-09-27 ENCOUNTER — Emergency Department (HOSPITAL_COMMUNITY): Payer: Medicare Other

## 2022-09-27 DIAGNOSIS — Y92410 Unspecified street and highway as the place of occurrence of the external cause: Secondary | ICD-10-CM | POA: Diagnosis not present

## 2022-09-27 DIAGNOSIS — Z7982 Long term (current) use of aspirin: Secondary | ICD-10-CM | POA: Insufficient documentation

## 2022-09-27 DIAGNOSIS — Z79899 Other long term (current) drug therapy: Secondary | ICD-10-CM | POA: Diagnosis not present

## 2022-09-27 DIAGNOSIS — M47814 Spondylosis without myelopathy or radiculopathy, thoracic region: Secondary | ICD-10-CM | POA: Diagnosis not present

## 2022-09-27 DIAGNOSIS — S0990XA Unspecified injury of head, initial encounter: Secondary | ICD-10-CM | POA: Diagnosis present

## 2022-09-27 DIAGNOSIS — I700 Atherosclerosis of aorta: Secondary | ICD-10-CM | POA: Insufficient documentation

## 2022-09-27 DIAGNOSIS — Z041 Encounter for examination and observation following transport accident: Secondary | ICD-10-CM | POA: Diagnosis not present

## 2022-09-27 DIAGNOSIS — S3991XA Unspecified injury of abdomen, initial encounter: Secondary | ICD-10-CM | POA: Diagnosis not present

## 2022-09-27 DIAGNOSIS — M25562 Pain in left knee: Secondary | ICD-10-CM | POA: Diagnosis not present

## 2022-09-27 DIAGNOSIS — R0789 Other chest pain: Secondary | ICD-10-CM | POA: Diagnosis not present

## 2022-09-27 DIAGNOSIS — I509 Heart failure, unspecified: Secondary | ICD-10-CM | POA: Diagnosis not present

## 2022-09-27 DIAGNOSIS — M546 Pain in thoracic spine: Secondary | ICD-10-CM | POA: Diagnosis not present

## 2022-09-27 DIAGNOSIS — S0083XA Contusion of other part of head, initial encounter: Secondary | ICD-10-CM | POA: Diagnosis not present

## 2022-09-27 DIAGNOSIS — S20219A Contusion of unspecified front wall of thorax, initial encounter: Secondary | ICD-10-CM | POA: Diagnosis not present

## 2022-09-27 DIAGNOSIS — I110 Hypertensive heart disease with heart failure: Secondary | ICD-10-CM | POA: Insufficient documentation

## 2022-09-27 DIAGNOSIS — J439 Emphysema, unspecified: Secondary | ICD-10-CM | POA: Insufficient documentation

## 2022-09-27 DIAGNOSIS — M542 Cervicalgia: Secondary | ICD-10-CM | POA: Diagnosis not present

## 2022-09-27 DIAGNOSIS — R519 Headache, unspecified: Secondary | ICD-10-CM | POA: Diagnosis not present

## 2022-09-27 DIAGNOSIS — S299XXA Unspecified injury of thorax, initial encounter: Secondary | ICD-10-CM | POA: Diagnosis not present

## 2022-09-27 DIAGNOSIS — M545 Low back pain, unspecified: Secondary | ICD-10-CM | POA: Insufficient documentation

## 2022-09-27 DIAGNOSIS — S80212A Abrasion, left knee, initial encounter: Secondary | ICD-10-CM | POA: Diagnosis not present

## 2022-09-27 DIAGNOSIS — M47816 Spondylosis without myelopathy or radiculopathy, lumbar region: Secondary | ICD-10-CM | POA: Diagnosis not present

## 2022-09-27 DIAGNOSIS — S3993XA Unspecified injury of pelvis, initial encounter: Secondary | ICD-10-CM | POA: Diagnosis not present

## 2022-09-27 LAB — I-STAT CHEM 8, ED
BUN: 16 mg/dL (ref 8–23)
Calcium, Ion: 1.23 mmol/L (ref 1.15–1.40)
Chloride: 102 mmol/L (ref 98–111)
Creatinine, Ser: 1.2 mg/dL — ABNORMAL HIGH (ref 0.44–1.00)
Glucose, Bld: 87 mg/dL (ref 70–99)
HCT: 41 % (ref 36.0–46.0)
Hemoglobin: 13.9 g/dL (ref 12.0–15.0)
Potassium: 3.7 mmol/L (ref 3.5–5.1)
Sodium: 142 mmol/L (ref 135–145)
TCO2: 27 mmol/L (ref 22–32)

## 2022-09-27 MED ORDER — ACETAMINOPHEN 325 MG PO TABS: 650.0000 mg | ORAL_TABLET | Freq: Four times a day (QID) | ORAL | 0 refills | Status: DC | PRN

## 2022-09-27 MED ORDER — ACETAMINOPHEN 500 MG PO TABS
1000.0000 mg | ORAL_TABLET | Freq: Once | ORAL | Status: AC
  Administered 2022-09-27: 1000 mg via ORAL
  Filled 2022-09-27: qty 2

## 2022-09-27 MED ORDER — METHOCARBAMOL 500 MG PO TABS: 500.0000 mg | ORAL_TABLET | Freq: Two times a day (BID) | ORAL | 0 refills | Status: DC | PRN

## 2022-09-27 MED ORDER — IOHEXOL 350 MG/ML SOLN
75.0000 mL | Freq: Once | INTRAVENOUS | Status: AC | PRN
  Administered 2022-09-27: 75 mL via INTRAVENOUS
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
## 2022-09-27 NOTE — ED Notes (Signed)
Patient transported to CT 
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
## 2022-10-02 DIAGNOSIS — J439 Emphysema, unspecified: Secondary | ICD-10-CM | POA: Diagnosis not present

## 2022-10-02 DIAGNOSIS — R0789 Other chest pain: Secondary | ICD-10-CM | POA: Diagnosis not present

## 2022-10-02 DIAGNOSIS — S0100XA Unspecified open wound of scalp, initial encounter: Secondary | ICD-10-CM | POA: Diagnosis not present

## 2022-10-02 DIAGNOSIS — M545 Low back pain, unspecified: Secondary | ICD-10-CM | POA: Diagnosis not present

## 2022-10-08 DIAGNOSIS — M8589 Other specified disorders of bone density and structure, multiple sites: Secondary | ICD-10-CM | POA: Diagnosis not present

## 2022-10-08 DIAGNOSIS — Z136 Encounter for screening for cardiovascular disorders: Secondary | ICD-10-CM | POA: Diagnosis not present

## 2022-10-08 DIAGNOSIS — Z0000 Encounter for general adult medical examination without abnormal findings: Secondary | ICD-10-CM | POA: Diagnosis not present

## 2022-10-08 DIAGNOSIS — Z13220 Encounter for screening for lipoid disorders: Secondary | ICD-10-CM | POA: Diagnosis not present

## 2022-10-17 ENCOUNTER — Encounter: Payer: Self-pay | Admitting: Pulmonary Disease

## 2022-10-17 ENCOUNTER — Ambulatory Visit: Payer: Medicare Other | Admitting: Pulmonary Disease

## 2022-10-17 VITALS — BP 128/70 | HR 96 | Ht 61.0 in | Wt 168.8 lb

## 2022-10-17 DIAGNOSIS — J4540 Moderate persistent asthma, uncomplicated: Secondary | ICD-10-CM | POA: Diagnosis not present

## 2022-10-17 MED ORDER — TRELEGY ELLIPTA 100-62.5-25 MCG/ACT IN AEPB: 1.0000 | INHALATION_SPRAY | Freq: Every day | RESPIRATORY_TRACT | 0 refills | Status: DC

## 2022-10-17 NOTE — Patient Instructions (Signed)
Nice to see you again  Lets try a different inhaler to see if helps more with her shortness of breath  Use Trelegy 1 puff once a day.  Rinse her mouth out with water after every use.  Stop the Advair when you start Trelegy  See me a message in 2 to 3 weeks via MyChart and let me know if the inhaler Trelegy has helped more or not.  If it does help more I will send a prescription, if not, we can go back to using your Advair.  Return to clinic in 6 months or sooner as needed with Dr. Silas Flood

## 2022-10-17 NOTE — Addendum Note (Signed)
Addended by: Monna Fam L on: 10/17/2022 11:33 AM   Modules accepted: Orders
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
## 2022-10-23 DIAGNOSIS — M85852 Other specified disorders of bone density and structure, left thigh: Secondary | ICD-10-CM | POA: Diagnosis not present

## 2022-10-25 DIAGNOSIS — M5416 Radiculopathy, lumbar region: Secondary | ICD-10-CM | POA: Diagnosis not present

## 2022-11-15 ENCOUNTER — Other Ambulatory Visit: Payer: Self-pay | Admitting: *Deleted

## 2022-11-15 MED ORDER — TRELEGY ELLIPTA 100-62.5-25 MCG/ACT IN AEPB: 1.0000 | INHALATION_SPRAY | Freq: Every day | RESPIRATORY_TRACT | 3 refills | Status: DC

## 2022-11-19 DIAGNOSIS — M47816 Spondylosis without myelopathy or radiculopathy, lumbar region: Secondary | ICD-10-CM | POA: Diagnosis not present

## 2022-11-19 DIAGNOSIS — M5416 Radiculopathy, lumbar region: Secondary | ICD-10-CM | POA: Diagnosis not present

## 2022-11-26 DIAGNOSIS — M48061 Spinal stenosis, lumbar region without neurogenic claudication: Secondary | ICD-10-CM | POA: Diagnosis not present

## 2022-11-28 ENCOUNTER — Telehealth: Payer: Self-pay | Admitting: Pulmonary Disease

## 2022-11-28 NOTE — Telephone Encounter (Signed)
Pt called the office stating that she was needing a letter written explaining why she was now on the Trelegy. She said that her insurance told her where they saw that she was prior on budesonide and Advair and need to know why she is now on the Trelegy instead of this.  Dr. Judeth Horn, please advise on this for pt if you are okay with a letter being written explaining why pt is on the Trelegy and what all needs to be stated in the letter.

## 2022-11-29 NOTE — Telephone Encounter (Signed)
Called patient but she did not answer. Left message for patient to call back.  

## 2022-11-29 NOTE — Telephone Encounter (Signed)
Ok with letter  The medications were changed to Trelegy to add additional bronchodilation given uncontrolled symptoms on Advair and given findings of hyperinflation on chest xray.

## 2022-12-05 NOTE — Telephone Encounter (Signed)
Patient is calling again to request a letter for her insurance company regarding her asthma medication.  She stated it can be addressed to her so she can bring it to her insurance company.  Please advise and call patient to discuss further at [phone]

## 2022-12-06 NOTE — Telephone Encounter (Signed)
Called and spoke with patient. She is aware that the letter is ready and I have also attached a copy of the last OV notes which explain the switch as well. Due to her not having transportation, she would like to have this mailed to her. I verified her address.   Nothing further needed at time of call.

## 2023-01-21 ENCOUNTER — Other Ambulatory Visit: Payer: Self-pay | Admitting: Pulmonary Disease

## 2023-02-10 ENCOUNTER — Telehealth: Payer: Self-pay | Admitting: Pulmonary Disease

## 2023-02-10 NOTE — Telephone Encounter (Signed)
Morrie Sheldon calling in regards to questions involving patient diagnosis, pls advise

## 2023-02-10 NOTE — Telephone Encounter (Signed)
Spoke with Morrie Sheldon from [facility]  regarding questions involving patient diagnosis. Truddie Crumble that patient diagnosis is  Asthma .   Ashley's voice was understanding.  Nothing else further needed at this time.

## 2023-02-19 ENCOUNTER — Emergency Department (HOSPITAL_COMMUNITY): Payer: Medicare Other

## 2023-02-19 ENCOUNTER — Observation Stay (HOSPITAL_COMMUNITY)
Admission: EM | Admit: 2023-02-19 | Discharge: 2023-02-20 | Disposition: A | Discharge status: Home / Self Care | Payer: Medicare Other | Attending: Family Medicine | Admitting: Family Medicine

## 2023-02-19 ENCOUNTER — Other Ambulatory Visit: Payer: Self-pay

## 2023-02-19 DIAGNOSIS — I110 Hypertensive heart disease with heart failure: Secondary | ICD-10-CM | POA: Insufficient documentation

## 2023-02-19 DIAGNOSIS — J45909 Unspecified asthma, uncomplicated: Secondary | ICD-10-CM | POA: Diagnosis present

## 2023-02-19 DIAGNOSIS — G928 Other toxic encephalopathy: Secondary | ICD-10-CM | POA: Diagnosis not present

## 2023-02-19 DIAGNOSIS — I5032 Chronic diastolic (congestive) heart failure: Secondary | ICD-10-CM | POA: Diagnosis not present

## 2023-02-19 DIAGNOSIS — J4540 Moderate persistent asthma, uncomplicated: Secondary | ICD-10-CM

## 2023-02-19 DIAGNOSIS — E876 Hypokalemia: Secondary | ICD-10-CM | POA: Insufficient documentation

## 2023-02-19 DIAGNOSIS — Z7982 Long term (current) use of aspirin: Secondary | ICD-10-CM | POA: Diagnosis not present

## 2023-02-19 DIAGNOSIS — R413 Other amnesia: Secondary | ICD-10-CM

## 2023-02-19 DIAGNOSIS — J4541 Moderate persistent asthma with (acute) exacerbation: Secondary | ICD-10-CM | POA: Insufficient documentation

## 2023-02-19 DIAGNOSIS — R4182 Altered mental status, unspecified: Principal | ICD-10-CM | POA: Insufficient documentation

## 2023-02-19 DIAGNOSIS — G9340 Encephalopathy, unspecified: Secondary | ICD-10-CM

## 2023-02-19 DIAGNOSIS — Z96659 Presence of unspecified artificial knee joint: Secondary | ICD-10-CM | POA: Insufficient documentation

## 2023-02-19 DIAGNOSIS — J9601 Acute respiratory failure with hypoxia: Secondary | ICD-10-CM | POA: Insufficient documentation

## 2023-02-19 DIAGNOSIS — T50905A Adverse effect of unspecified drugs, medicaments and biological substances, initial encounter: Principal | ICD-10-CM

## 2023-02-19 LAB — COMPREHENSIVE METABOLIC PANEL
ALT: 15 U/L (ref 0–44)
AST: 17 U/L (ref 15–41)
Albumin: 3.8 g/dL (ref 3.5–5.0)
Alkaline Phosphatase: 44 U/L (ref 38–126)
Anion gap: 16 — ABNORMAL HIGH (ref 5–15)
BUN: 17 mg/dL (ref 8–23)
CO2: 24 mmol/L (ref 22–32)
Calcium: 9.4 mg/dL (ref 8.9–10.3)
Chloride: 102 mmol/L (ref 98–111)
Creatinine, Ser: 1.24 mg/dL — ABNORMAL HIGH (ref 0.44–1.00)
GFR, Estimated: 45 mL/min — ABNORMAL LOW (ref >60)
Glucose, Bld: 96 mg/dL (ref 70–99)
Potassium: 3.2 mmol/L — ABNORMAL LOW (ref 3.5–5.1)
Sodium: 142 mmol/L (ref 135–145)
Total Bilirubin: 0.7 mg/dL (ref 0.3–1.2)
Total Protein: 6.8 g/dL (ref 6.5–8.1)

## 2023-02-19 LAB — CBC WITH DIFFERENTIAL/PLATELET
Abs Immature Granulocytes: 0.1 10*3/uL — ABNORMAL HIGH (ref 0.00–0.07)
Basophils Absolute: 0.1 10*3/uL (ref 0.0–0.1)
Basophils Relative: 1 %
Eosinophils Absolute: 0.2 10*3/uL (ref 0.0–0.5)
Eosinophils Relative: 3 %
HCT: 42.1 % (ref 36.0–46.0)
Hemoglobin: 13.8 g/dL (ref 12.0–15.0)
Immature Granulocytes: 1 %
Lymphocytes Relative: 40 %
Lymphs Abs: 3.3 10*3/uL (ref 0.7–4.0)
MCH: 30.6 pg (ref 26.0–34.0)
MCHC: 32.8 g/dL (ref 30.0–36.0)
MCV: 93.3 fL (ref 80.0–100.0)
Monocytes Absolute: 0.6 10*3/uL (ref 0.1–1.0)
Monocytes Relative: 7 %
Neutro Abs: 4.1 10*3/uL (ref 1.7–7.7)
Neutrophils Relative %: 48 %
Platelets: 214 10*3/uL (ref 150–400)
RBC: 4.51 MIL/uL (ref 3.87–5.11)
RDW: 13.3 % (ref 11.5–15.5)
WBC: 8.4 10*3/uL (ref 4.0–10.5)
nRBC: 0 % (ref 0.0–0.2)

## 2023-02-19 LAB — URINALYSIS, ROUTINE W REFLEX MICROSCOPIC
Bilirubin Urine: NEGATIVE
Glucose, UA: NEGATIVE mg/dL
Hgb urine dipstick: NEGATIVE
Ketones, ur: NEGATIVE mg/dL
Nitrite: NEGATIVE
Protein, ur: NEGATIVE mg/dL
Specific Gravity, Urine: 1.023 (ref 1.005–1.030)
pH: 5 (ref 5.0–8.0)

## 2023-02-19 LAB — TROPONIN I (HIGH SENSITIVITY): Troponin I (High Sensitivity): 7 ng/L (ref <18)

## 2023-02-19 LAB — D-DIMER, QUANTITATIVE: D-Dimer, Quant: 0.31 ug/mL-FEU (ref 0.00–0.50)

## 2023-02-19 LAB — CBG MONITORING, ED: Glucose-Capillary: 85 mg/dL (ref 70–99)

## 2023-02-19 MED ORDER — CITALOPRAM HYDROBROMIDE 10 MG PO TABS
40.0000 mg | ORAL_TABLET | Freq: Every day | ORAL | Status: DC
  Administered 2023-02-19 – 2023-02-20 (×2): 40 mg via ORAL
  Filled 2023-02-19 (×2): qty 4

## 2023-02-19 MED ORDER — HYDRALAZINE HCL 25 MG PO TABS: 25.0000 mg | ORAL_TABLET | Freq: Four times a day (QID) | ORAL | Status: DC | PRN

## 2023-02-19 MED ORDER — METHYLPREDNISOLONE SODIUM SUCC 125 MG IJ SOLR
125.0000 mg | Freq: Once | INTRAMUSCULAR | Status: AC
  Administered 2023-02-19: 125 mg via INTRAVENOUS
  Filled 2023-02-19: qty 2

## 2023-02-19 MED ORDER — PANTOPRAZOLE SODIUM 40 MG PO TBEC
40.0000 mg | DELAYED_RELEASE_TABLET | Freq: Every day | ORAL | Status: DC
  Administered 2023-02-19 – 2023-02-20 (×2): 40 mg via ORAL
  Filled 2023-02-19 (×2): qty 1

## 2023-02-19 MED ORDER — SODIUM CHLORIDE 0.9 % IV BOLUS
500.0000 mL | Freq: Once | INTRAVENOUS | Status: AC
  Administered 2023-02-19: 500 mL via INTRAVENOUS

## 2023-02-19 MED ORDER — ALBUTEROL SULFATE (2.5 MG/3ML) 0.083% IN NEBU
2.5000 mg | INHALATION_SOLUTION | Freq: Once | RESPIRATORY_TRACT | Status: AC
  Administered 2023-02-19: 2.5 mg via RESPIRATORY_TRACT
  Filled 2023-02-19: qty 3

## 2023-02-19 MED ORDER — MELATONIN 3 MG PO TABS
3.0000 mg | ORAL_TABLET | Freq: Every day | ORAL | Status: DC
  Administered 2023-02-19: 3 mg via ORAL
  Filled 2023-02-19: qty 1

## 2023-02-19 MED ORDER — FLUTICASONE FUROATE-VILANTEROL 100-25 MCG/ACT IN AEPB
1.0000 | INHALATION_SPRAY | Freq: Every day | RESPIRATORY_TRACT | Status: DC
  Administered 2023-02-20: 1 via RESPIRATORY_TRACT
  Filled 2023-02-19: qty 28

## 2023-02-19 MED ORDER — METHOCARBAMOL 500 MG PO TABS: 500.0000 mg | ORAL_TABLET | Freq: Two times a day (BID) | ORAL | Status: DC | PRN

## 2023-02-19 MED ORDER — ALBUTEROL SULFATE (2.5 MG/3ML) 0.083% IN NEBU: 3.0000 mL | INHALATION_SOLUTION | RESPIRATORY_TRACT | Status: DC | PRN

## 2023-02-19 MED ORDER — NITROGLYCERIN 0.4 MG SL SUBL: 0.4000 mg | SUBLINGUAL_TABLET | SUBLINGUAL | Status: DC | PRN

## 2023-02-19 MED ORDER — IPRATROPIUM-ALBUTEROL 0.5-2.5 (3) MG/3ML IN SOLN
3.0000 mL | Freq: Once | RESPIRATORY_TRACT | Status: AC
  Administered 2023-02-19: 3 mL via RESPIRATORY_TRACT
  Filled 2023-02-19: qty 3

## 2023-02-19 MED ORDER — CLOPIDOGREL BISULFATE 75 MG PO TABS
75.0000 mg | ORAL_TABLET | Freq: Every day | ORAL | Status: DC
  Administered 2023-02-19 – 2023-02-20 (×2): 75 mg via ORAL
  Filled 2023-02-19 (×2): qty 1

## 2023-02-19 MED ORDER — HYDROCHLOROTHIAZIDE 12.5 MG PO TABS
12.5000 mg | ORAL_TABLET | Freq: Every day | ORAL | Status: DC
  Administered 2023-02-20: 12.5 mg via ORAL
  Filled 2023-02-19: qty 1

## 2023-02-19 MED ORDER — DIAZEPAM 5 MG PO TABS: 2.5000 mg | ORAL_TABLET | Freq: Every day | ORAL | Status: DC | PRN

## 2023-02-19 MED ORDER — ENOXAPARIN SODIUM 40 MG/0.4ML IJ SOSY
40.0000 mg | PREFILLED_SYRINGE | INTRAMUSCULAR | Status: DC
  Administered 2023-02-19: 40 mg via SUBCUTANEOUS
  Filled 2023-02-19: qty 0.4

## 2023-02-19 MED ORDER — PREDNISONE 20 MG PO TABS
40.0000 mg | ORAL_TABLET | Freq: Every day | ORAL | Status: DC
  Administered 2023-02-19 – 2023-02-20 (×2): 40 mg via ORAL
  Filled 2023-02-19 (×2): qty 2

## 2023-02-19 MED ORDER — POTASSIUM CHLORIDE CRYS ER 20 MEQ PO TBCR
40.0000 meq | EXTENDED_RELEASE_TABLET | Freq: Once | ORAL | Status: AC
  Administered 2023-02-19: 40 meq via ORAL
  Filled 2023-02-19: qty 2

## 2023-02-19 MED ORDER — NEBIVOLOL HCL 10 MG PO TABS
20.0000 mg | ORAL_TABLET | Freq: Every day | ORAL | Status: DC
  Administered 2023-02-19 – 2023-02-20 (×2): 20 mg via ORAL
  Filled 2023-02-19: qty 2
  Filled 2023-02-19: qty 4
  Filled 2023-02-19: qty 2

## 2023-02-19 NOTE — ED Notes (Signed)
Patient transported to MRI 

## 2023-02-19 NOTE — ED Notes (Addendum)
Pt c/o 8/10 CP; central, pressure; pt warm and dry, mildly sob; repeat EKG done; Dr. Chipper Herb notified and given printed EKG for evaluation

## 2023-02-19 NOTE — ED Notes (Signed)
Pt's O2 sats dropped to 85% on RA while ambulating and she became sob; sob resolved with rest, sats back up to mid 90s on RA while at rest

## 2023-02-19 NOTE — ED Provider Notes (Signed)
[facility] AT [facility] Provider Note   CSN: [id number] Arrival date & time: 02/19/23  0746     History  Chief Complaint  Patient presents with   Altered Mental Status    KARAH CARUTHERS is a 77 y.o. female.  Pt is a 77 yo female with pmhx significant for htn, asthma, chf, seizures, and anxiety.  She was found outside her house by her neighbor.  She does not remember how she got there.  She remembers eating dinner and then lying on the couch to watch TV.  She does not remember going to bed or going outside.  She has no hx of dementia.  She has knee pain, but that is chronic.  She has no new pain today.       Home Medications Prior to Admission medications   Medication Sig Start Date End Date Taking? Authorizing Provider  acetaminophen (TYLENOL) 325 MG tablet Take 2 tablets (650 mg total) by mouth every 6 (six) hours as needed for up to 30 doses for moderate pain or mild pain. 09/27/22   Terald Sleeper, MD  albuterol (PROVENTIL HFA;VENTOLIN HFA) 108 (90 BASE) MCG/ACT inhaler Inhale 2-4 puffs into the lungs every 4 (four) hours as needed for wheezing or shortness of breath. 11/15/14   Ward, Layla Maw, DO  ALPRAZolam Prudy Feeler) 0.25 MG tablet Take 0.25 mg by mouth at bedtime as needed for anxiety.    [provider]  aspirin EC 81 MG tablet Take 81 mg by mouth every evening.    [provider]  Boric Acid Vaginal (AZO BORIC ACID) 600 MG SUPP Place 1 tablet vaginally at bedtime. 09/25/21   Brock Bad, MD  budesonide (PULMICORT) 0.5 MG/2ML nebulizer solution Take 0.5 mg by nebulization 2 (two) times daily.     [provider]  Cholecalciferol ([employer] VIT D 5000 HIGH-POTENCY PO) Take 5,000 Units by mouth daily.    [provider]  citalopram (CELEXA) 40 MG tablet Take 40 mg by mouth daily. 11/10/19   [provider]  hydrochlorothiazide (HYDRODIURIL) 25 MG tablet Take 12.5 mg by mouth daily.    [provider]  methocarbamol (ROBAXIN) 500 MG tablet Take 1 tablet (500 mg total) by mouth 2 (two) times daily as needed for up to 14 doses for muscle spasms. 09/27/22   Terald Sleeper, MD  Nebivolol HCl 20 MG TABS Take 20 mg by mouth every morning.    [provider]  pantoprazole (PROTONIX) 40 MG tablet Take 40 mg by mouth daily. 09/15/19   [provider]  polyvinyl alcohol (LIQUIFILM TEARS) 1.4 % ophthalmic solution Place 1 drop into both eyes daily as needed for dry eyes.    [provider]  potassium chloride SA (K-DUR,KLOR-CON) 20 MEQ tablet Take 20 mEq by mouth daily.    [provider]  promethazine-dextromethorphan (PROMETHAZINE-DM) 6.25-15 MG/5ML syrup Take 5 mLs by mouth 4 (four) times daily as needed for cough.    [provider]  terconazole (TERAZOL 7) 0.4 % vaginal cream Place 1 applicator vaginally at bedtime. 09/03/21   Brock Bad, MD  traMADol (ULTRAM) 50 MG tablet Take 1 tablet (50 mg total) by mouth every 8 (eight) hours as needed for severe pain. 04/21/16   Vassie Loll, MD  TRELEGY ELLIPTA 100-62.5-25 MCG/ACT AEPB Inhale 1 puff into the lungs daily as directed. 01/21/23   Hunsucker, Lesia Sago, MD  vitamin C (ASCORBIC ACID) 500 MG tablet Take 500  mg by mouth daily.    [provider]      Allergies    Azithromycin, Codeine, Levofloxacin, Meloxicam, Penicillins, and Naproxen    Review of Systems   Review of Systems  Musculoskeletal:        Knee pain bilat  All other systems reviewed and are negative.   Physical Exam Updated Vital Signs BP (!) 183/80   Pulse 65   Temp 98 F (36.7 C)   Resp 15   SpO2 99%  Physical Exam Vitals and nursing note reviewed.  Constitutional:      Appearance: Normal appearance.  HENT:     Head: Normocephalic and atraumatic.     Right Ear: External ear normal.     Left Ear: External ear normal.     Nose: Nose normal.     Mouth/Throat:     Mouth: Mucous membranes are dry.   Eyes:     Extraocular Movements: Extraocular movements intact.     Conjunctiva/sclera: Conjunctivae normal.     Pupils: Pupils are equal, round, and reactive to light.  Cardiovascular:     Rate and Rhythm: Normal rate and regular rhythm.     Pulses: Normal pulses.     Heart sounds: Normal heart sounds.  Pulmonary:     Effort: Pulmonary effort is normal.     Breath sounds: Normal breath sounds.  Abdominal:     General: Abdomen is flat. Bowel sounds are normal.     Palpations: Abdomen is soft.  Musculoskeletal:        General: Normal range of motion.     Cervical back: Normal range of motion and neck supple.  Skin:    General: Skin is warm.     Capillary Refill: Capillary refill takes less than 2 seconds.  Neurological:     General: No focal deficit present.     Mental Status: She is alert and oriented to person, place, and time.  Psychiatric:        Mood and Affect: Mood normal.        Behavior: Behavior normal.     ED Results / Procedures / Treatments   Labs (all labs ordered are listed, but only abnormal results are displayed) Labs Reviewed  CBC WITH DIFFERENTIAL/PLATELET - Abnormal; Notable for the following components:      Result Value   Abs Immature Granulocytes 0.10 (*)    All other components within normal limits  COMPREHENSIVE METABOLIC PANEL - Abnormal; Notable for the following components:   Potassium 3.2 (*)    Creatinine, Ser 1.24 (*)    GFR, Estimated 45 (*)    Anion gap 16 (*)    All other components within normal limits  URINALYSIS, ROUTINE W REFLEX MICROSCOPIC - Abnormal; Notable for the following components:   APPearance HAZY (*)    Leukocytes,Ua MODERATE (*)    Bacteria, UA RARE (*)    All other components within normal limits  D-DIMER, QUANTITATIVE  CBG MONITORING, ED    EKG EKG Interpretation Date/Time:  Wednesday February 19 2023 08:06:23 EDT Ventricular Rate:  64 PR Interval:  153 QRS Duration:  60 QT Interval:  434 QTC  Calculation: 448 R Axis:   64  Text Interpretation: Sinus rhythm No significant change since last tracing Confirmed by Jacalyn Lefevre [phone]) on 02/19/2023 8:33:13 AM  Radiology MR BRAIN WO CONTRAST  Result Date: 02/19/2023 CLINICAL DATA:  Altered mental status EXAM: MRI HEAD WITHOUT CONTRAST TECHNIQUE: Multiplanar, multiecho pulse sequences of the brain and surrounding  structures were obtained without intravenous contrast. COMPARISON:  Same-day CT head FINDINGS: Brain: There is no acute intracranial hemorrhage, extra-axial fluid collection, or acute infarct Parenchymal volume is normal for age. The ventricles are normal in size. There is minimal background chronic small-vessel ischemic change. The pituitary and suprasellar region are normal. There is no mass lesion. There is no mass effect or midline shift. Vascular: Normal flow voids. Skull and upper cervical spine: Normal marrow signal. Sinuses/Orbits: There is hyperostosis of the maxillary sinus walls consistent with sequela of chronic sinusitis. The globes and orbits are unremarkable. Other: There is trace fluid in the left mastoid tip. IMPRESSION: Normal for age appearance of the brain with no acute intracranial pathology. Electronically Signed   By: Lesia Hausen M.D.   On: 02/19/2023 12:47   CT Head Wo Contrast  Result Date: 02/19/2023 CLINICAL DATA:  77 year old female with altered mental status. EXAM: CT HEAD WITHOUT CONTRAST TECHNIQUE: Contiguous axial images were obtained from the base of the skull through the vertex without intravenous contrast. RADIATION DOSE REDUCTION: This exam was performed according to the departmental dose-optimization program which includes automated exposure control, adjustment of the mA and/or kV according to patient size and/or use of iterative reconstruction technique. COMPARISON:  Head CT 09/27/2022. FINDINGS: Brain: Cerebral volume is stable, normal for age. No midline shift, ventriculomegaly, mass effect,  evidence of mass lesion, intracranial hemorrhage or evidence of cortically based acute infarction. Gray-white differentiation is stable and normal for age. Vascular: No suspicious intracranial vascular hyperdensity. Calcified atherosclerosis at the skull base. Skull: No acute osseous abnormality identified. Sinuses/Orbits: Chronic maxillary sinusitis with mucoperiosteal thickening appears stable. Other Visualized paranasal sinuses and mastoids are stable and well aerated. Other: No acute orbit or scalp soft tissue finding. IMPRESSION: 1. Stable and normal for age noncontrast CT appearance of the brain. 2. Chronic maxillary sinusitis. Electronically Signed   By: Odessa Fleming M.D.   On: 02/19/2023 09:37   DG Chest 2 View  Result Date: 02/19/2023 CLINICAL DATA:  Altered mental status EXAM: CHEST - 2 VIEW COMPARISON:  Chest x-ray April 23, 2021 FINDINGS: The cardiomediastinal silhouette is unchanged in contour. Right basilar bandlike opacities. No pleural effusion or pneumothorax. The visualized upper abdomen is unremarkable. No acute osseous abnormality. IMPRESSION: Right basilar bandlike opacities, likely atelectasis. Electronically Signed   By: Jacob Moores M.D.   On: 02/19/2023 08:36    Procedures Procedures    Medications Ordered in ED Medications  albuterol (PROVENTIL) (2.5 MG/3ML) 0.083% nebulizer solution 2.5 mg (has no administration in time range)  sodium chloride 0.9 % bolus 500 mL (0 mLs Intravenous Stopped 02/19/23 1007)  ipratropium-albuterol (DUONEB) 0.5-2.5 (3) MG/3ML nebulizer solution 3 mL (3 mLs Nebulization Given 02/19/23 1012)  methylPREDNISolone sodium succinate (SOLU-MEDROL) 125 mg/2 mL injection 125 mg (125 mg Intravenous Given 02/19/23 1048)    ED Course/ Medical Decision Making/ A&P                             Medical Decision Making Amount and/or Complexity of Data Reviewed Labs: ordered. Radiology: ordered.  Risk Prescription drug management. Decision regarding  hospitalization.   This patient presents to the ED for concern of loss of memory, this involves an extensive number of treatment options, and is a complaint that carries with it a high risk of complications and morbidity.  The differential diagnosis includes cva, infection, electrolyte abn   Co morbidities that complicate the patient evaluation  htn, asthma,  chf, seizures, and anxiety   Additional history obtained:  Additional history obtained from epic chart review External records from outside source obtained and reviewed including EMS report   Lab Tests:  I Ordered, and personally interpreted labs.  The pertinent results include:  cbc nl, cmp with k low at 3.2, cr sl elevated at 1.24, ua nl, ddimer neg   Imaging Studies ordered:  I ordered imaging studies including cxr, ct head, MRI brain  I independently visualized and interpreted imaging which showed  CXR: Right basilar bandlike opacities, likely atelectasis.  CT head: . Stable and normal for age noncontrast CT appearance of the brain.  2. Chronic maxillary sinusitis.  MRI brain: Normal for age appearance of the brain with no acute intracranial  pathology.   I agree with the radiologist interpretation   Cardiac Monitoring:  The patient was maintained on a cardiac monitor.  I personally viewed and interpreted the cardiac monitored which showed an underlying rhythm of: nsr   Medicines ordered and prescription drug management:  I ordered medication including ivfs  for sx  Reevaluation of the patient after these medicines showed that the patient improved I have reviewed the patients home medicines and have made adjustments as needed   Test Considered:  mri  Problem List / ED Course:  Sleep walking:  pt's medical work up is neg.  MRI brain normal.  Pt d/w her daughter who lives out of town.  She said pt has been taking medicine for sleep.  I called her pharmacy and was told she just had ambien filled on 6/20.   She also recently had flexeril filled.  I am going to have her hold both of those. SOB:  pt's O2 sat did drop with ambulation.  Pt given steroids and nebs.  Pt is feeling better at rest, but even after those, her O2 sats drop to 85%.  Pt put back in bed, put on 2L and given additional nebs.  Pt d/w Dr. Chipper Herb ([facility]) who will admit.   Reevaluation:  After the interventions noted above, I reevaluated the patient and found that they have :improved   Social Determinants of Health:  Lives at home   Dispostion:  After consideration of the diagnostic results and the patients response to treatment, I feel that the patent would benefit from discharge with outpatient f/u.          Final Clinical Impression(s) / ED Diagnoses Final diagnoses:  Adverse effect of drug, initial encounter  Moderate persistent asthma with exacerbation    Rx / DC Orders ED Discharge Orders          Ordered    Ambulatory referral to Neurology       Comments: An appointment is requested in approximately: 1 week   02/19/23 1359              Jacalyn Lefevre, MD 02/19/23 1449

## 2023-02-19 NOTE — ED Notes (Signed)
Dr. Zhang at bedside.  

## 2023-02-19 NOTE — ED Notes (Signed)
Pt up to bedside commode with assistance; became sob transferring from bedside commode to stretcher, desatted to low 80's; sob improved with rest, sats up to 89,90% RA; pt placed on 2L [place]; Dr. Particia Nearing notified

## 2023-02-19 NOTE — ED Triage Notes (Addendum)
Pt bib ems ; found outside by neighbor; pt unsure how she got there; alert to person and place but not time or event, event; pt c/o leg and knee pain with ems, chronic; denies pain currently; equal grip strength; no other neuro deficits; no other complaints; cbg 98, HR 66, BP 148/83, sats 97% RA; denies fever, no recent illness, denies urinary symptoms; 12 lead unremarkable

## 2023-02-19 NOTE — Discharge Instructions (Signed)
DO NOT take Ambien (Zolpidem) or Flexeril (Cyclobenzaprine).

## 2023-02-19 NOTE — ED Notes (Addendum)
Pt returned from MRI °

## 2023-02-19 NOTE — ED Notes (Signed)
Patient transported to CT 

## 2023-02-19 NOTE — H&P (Signed)
History and Physical    Renee Adkins ZOX:[id number] DOB: [birth year] DOA: 02/19/2023  PCP: Elias Else, MD (Inactive) (Confirm with patient/family/NH records and if not entered, this has to be entered at [facility] point of entry) Patient coming from: Home  I have personally briefly reviewed patient's old medical records in [facility]  Chief Complaint: wheezing, cough, chest pain, SOB  HPI: Renee Adkins is a 77 y.o. female with medical history significant of moderate asthma, HTN, anxiety/depression, chronic HFpEF, presented with multiple complaints including wheezing cough shortness of breath, altered mentation.  Patient has a poorly controlled asthma for the last 3 months.  She was seen by pulmonary for LABA escalated to Trelegy, despite she continued to have using dry cough exertional dyspnea.  She denies any chest pains no fever chills no recent weight loss.  Recently, patient has been having trouble with her sleeping, she has trouble to fall asleep.  2 weeks ago she was started on Ambien by her doctor but she reported inconsistent results.  Continue with experience follow-up for sleep on average she sleeps about 3 to 4 hours a day and became very tired daytime.  This morning she was found by neighbor half naked, patient explained that she somehow was blocked from home health and went to see neighbor to get help.  They will call EMS.  EMS arrived and found patient confused. ED Course: Afebrile, blood pressure elevated nonhypoxic at rest but O2 saturation dropped to low 80s on short distance ambulation.  Brain MRI negative for acute findings.  Patient was given multiple rounds of bronchodilators.  Blood work showed WBC 4, K3.2, creatinine 1.2.  X-ray negative for acute infiltrates.  During ED stay, patient started to have pressure-like chest pain, centrally located, denies any associated symptoms of increasing shortness of breath palpitation lightheadedness.  EKG showed no acute ST changes..   Troponin ordered.  Review of Systems: As per HPI otherwise 14 point review of systems negative.    Past Medical History:  Diagnosis Date   Anxiety 01/25/2012   Asthma    Chronic diastolic congestive heart failure (HCC) 01/29/2012   Hypertension    Paresthesia 01/27/2012   Pericarditis    Seizures (HCC)     Past Surgical History:  Procedure Laterality Date   COLONOSCOPY  03/2009   repeat due 5-10 years   DG  BONE DENSITY ([facility] HX)  11/2014   normal   TOTAL KNEE ARTHROPLASTY  Jan 2011   rt knee   VESICOVAGINAL FISTULA CLOSURE W/ TAH       reports that she has never smoked. She has never used smokeless tobacco. She reports current alcohol use. She reports that she does not use drugs.  Allergies  Allergen Reactions   Azithromycin Shortness Of Breath and Swelling   Codeine Nausea And Vomiting and Other (See Comments)    REACTION: tachycardia   Levofloxacin Shortness Of Breath   Meloxicam Shortness Of Breath   Penicillins Anaphylaxis   Naproxen Nausea And Vomiting    Family History  Problem Relation Age of Onset   Heart attack Father 55   Heart attack Brother    Cancer - Lung Brother    Cancer Sister        liver   Asthma Neg Hx    Allergies Neg Hx      Prior to Admission medications   Medication Sig Start Date End Date Taking? Authorizing Provider  Cholecalciferol ([employer] VIT D 5000 HIGH-POTENCY PO) Take 5,000 Units by  mouth daily.   Yes [provider]  diazepam (VALIUM) 5 MG tablet Take 2.5-5 mg by mouth daily as needed. 01/21/23  Yes [provider]  fluticasone-salmeterol (ADVAIR) 250-50 MCG/ACT AEPB Inhale 1 puff into the lungs 2 (two) times daily. 12/04/22  Yes [provider]  TRELEGY ELLIPTA 100-62.5-25 MCG/ACT AEPB Inhale 1 puff into the lungs daily as directed. 01/21/23  Yes Hunsucker, Lesia Sago, MD  albuterol (PROVENTIL HFA;VENTOLIN HFA) 108 (90 BASE) MCG/ACT inhaler Inhale 2-4 puffs into the lungs every 4 (four) hours as needed for  wheezing or shortness of breath. Patient not taking: Reported on 02/19/2023 11/15/14   Ward, Layla Maw, DO  citalopram (CELEXA) 40 MG tablet Take 40 mg by mouth daily. 11/10/19   [provider]  hydrochlorothiazide (HYDRODIURIL) 25 MG tablet Take 12.5 mg by mouth daily.    [provider]  methocarbamol (ROBAXIN) 500 MG tablet Take 1 tablet (500 mg total) by mouth 2 (two) times daily as needed for up to 14 doses for muscle spasms. 09/27/22   Terald Sleeper, MD  Nebivolol HCl 20 MG TABS Take 20 mg by mouth every morning.    [provider]  pantoprazole (PROTONIX) 40 MG tablet Take 40 mg by mouth daily. 09/15/19   [provider]  polyvinyl alcohol (LIQUIFILM TEARS) 1.4 % ophthalmic solution Place 1 drop into both eyes daily as needed for dry eyes.    [provider]  potassium chloride SA (K-DUR,KLOR-CON) 20 MEQ tablet Take 20 mEq by mouth daily.    [provider]  promethazine-dextromethorphan (PROMETHAZINE-DM) 6.25-15 MG/5ML syrup Take 5 mLs by mouth 4 (four) times daily as needed for cough.    [provider]  terconazole (TERAZOL 7) 0.4 % vaginal cream Place 1 applicator vaginally at bedtime. 09/03/21   Brock Bad, MD  traMADol (ULTRAM) 50 MG tablet Take 1 tablet (50 mg total) by mouth every 8 (eight) hours as needed for severe pain. 04/21/16   Vassie Loll, MD  vitamin C (ASCORBIC ACID) 500 MG tablet Take 500 mg by mouth daily.    [provider]    Physical Exam: Vitals:   02/19/23 1430 02/19/23 1500 02/19/23 1515 02/19/23 1530  BP: (!) 185/85 (!) 171/97  (!) 163/77  Pulse: 70 78 83 86  Resp: 16 19 16 11   Temp:      TempSrc:      SpO2: 97% 93% 96% 94%    Constitutional: NAD, calm, comfortable Vitals:   02/19/23 1430 02/19/23 1500 02/19/23 1515 02/19/23 1530  BP: (!) 185/85 (!) 171/97  (!) 163/77  Pulse: 70 78 83 86  Resp: 16 19 16 11   Temp:      TempSrc:      SpO2: 97% 93% 96% 94%   Eyes: PERRL,  lids and conjunctivae normal ENMT: Mucous membranes are moist. Posterior pharynx clear of any exudate or lesions.Normal dentition.  Neck: normal, supple, no masses, no thyromegaly Respiratory: Diminished breathing sound bilaterally, scattered wheezing no crackles, increasing respiratory effort. No accessory muscle use.  Cardiovascular: Regular rate and rhythm, no murmurs / rubs / gallops. No extremity edema. 2+ pedal pulses. No carotid bruits.  Abdomen: no tenderness, no masses palpated. No hepatosplenomegaly. Bowel sounds positive.  Musculoskeletal: no clubbing / cyanosis. No joint deformity upper and lower extremities. Good ROM, no contractures. Normal muscle tone.  Skin: no rashes, lesions, ulcers. No induration Neurologic: CN 2-12 grossly intact. Sensation intact, DTR normal. Strength 5/5 in all 4.  Psychiatric:  Normal judgment and insight. Alert and oriented x 3. Normal mood.    Labs on Admission: I have personally reviewed following labs and imaging studies  CBC: Recent Labs  Lab 02/19/23 0809  WBC 8.4  NEUTROABS 4.1  HGB 13.8  HCT 42.1  MCV 93.3  PLT 214   Basic Metabolic Panel: Recent Labs  Lab 02/19/23 0809  NA 142  K 3.2*  CL 102  CO2 24  GLUCOSE 96  BUN 17  CREATININE 1.24*  CALCIUM 9.4   GFR: CrCl cannot be calculated (Unknown ideal weight.). Liver Function Tests: Recent Labs  Lab 02/19/23 0809  AST 17  ALT 15  ALKPHOS 44  BILITOT 0.7  PROT 6.8  ALBUMIN 3.8   No results for input(s): "LIPASE", "AMYLASE" in the last 168 hours. No results for input(s): "AMMONIA" in the last 168 hours. Coagulation Profile: No results for input(s): "INR", "PROTIME" in the last 168 hours. Cardiac Enzymes: No results for input(s): "CKTOTAL", "CKMB", "CKMBINDEX", "TROPONINI" in the last 168 hours. BNP (last 3 results) No results for input(s): "PROBNP" in the last 8760 hours. HbA1C: No results for input(s): "HGBA1C" in the last 72 hours. CBG: Recent Labs  Lab  02/19/23 0812  GLUCAP 85   Lipid Profile: No results for input(s): "CHOL", "HDL", "LDLCALC", "TRIG", "CHOLHDL", "LDLDIRECT" in the last 72 hours. Thyroid Function Tests: No results for input(s): "TSH", "T4TOTAL", "FREET4", "T3FREE", "THYROIDAB" in the last 72 hours. Anemia Panel: No results for input(s): "VITAMINB12", "FOLATE", "FERRITIN", "TIBC", "IRON", "RETICCTPCT" in the last 72 hours. Urine analysis:    Component Value Date/Time   COLORURINE YELLOW 02/19/2023 1005   APPEARANCEUR HAZY (A) 02/19/2023 1005   LABSPEC 1.023 02/19/2023 1005   PHURINE 5.0 02/19/2023 1005   GLUCOSEU NEGATIVE 02/19/2023 1005   HGBUR NEGATIVE 02/19/2023 1005   BILIRUBINUR NEGATIVE 02/19/2023 1005   KETONESUR NEGATIVE 02/19/2023 1005   PROTEINUR NEGATIVE 02/19/2023 1005   UROBILINOGEN 0.2 02/18/2009 1040   NITRITE NEGATIVE 02/19/2023 1005   LEUKOCYTESUR MODERATE (A) 02/19/2023 1005    Radiological Exams on Admission: MR BRAIN WO CONTRAST  Result Date: 02/19/2023 CLINICAL DATA:  Altered mental status EXAM: MRI HEAD WITHOUT CONTRAST TECHNIQUE: Multiplanar, multiecho pulse sequences of the brain and surrounding structures were obtained without intravenous contrast. COMPARISON:  Same-day CT head FINDINGS: Brain: There is no acute intracranial hemorrhage, extra-axial fluid collection, or acute infarct Parenchymal volume is normal for age. The ventricles are normal in size. There is minimal background chronic small-vessel ischemic change. The pituitary and suprasellar region are normal. There is no mass lesion. There is no mass effect or midline shift. Vascular: Normal flow voids. Skull and upper cervical spine: Normal marrow signal. Sinuses/Orbits: There is hyperostosis of the maxillary sinus walls consistent with sequela of chronic sinusitis. The globes and orbits are unremarkable. Other: There is trace fluid in the left mastoid tip. IMPRESSION: Normal for age appearance of the brain with no acute intracranial  pathology. Electronically Signed   By: Lesia Hausen M.D.   On: 02/19/2023 12:47   CT Head Wo Contrast  Result Date: 02/19/2023 CLINICAL DATA:  77 year old female with altered mental status. EXAM: CT HEAD WITHOUT CONTRAST TECHNIQUE: Contiguous axial images were obtained from the base of the skull through the vertex without intravenous contrast. RADIATION DOSE REDUCTION: This exam was performed according to the departmental dose-optimization program which includes automated exposure control, adjustment of the mA and/or kV according to patient size and/or use of iterative reconstruction technique. COMPARISON:  Head CT 09/27/2022. FINDINGS:  Brain: Cerebral volume is stable, normal for age. No midline shift, ventriculomegaly, mass effect, evidence of mass lesion, intracranial hemorrhage or evidence of cortically based acute infarction. Gray-white differentiation is stable and normal for age. Vascular: No suspicious intracranial vascular hyperdensity. Calcified atherosclerosis at the skull base. Skull: No acute osseous abnormality identified. Sinuses/Orbits: Chronic maxillary sinusitis with mucoperiosteal thickening appears stable. Other Visualized paranasal sinuses and mastoids are stable and well aerated. Other: No acute orbit or scalp soft tissue finding. IMPRESSION: 1. Stable and normal for age noncontrast CT appearance of the brain. 2. Chronic maxillary sinusitis. Electronically Signed   By: Odessa Fleming M.D.   On: 02/19/2023 09:37   DG Chest 2 View  Result Date: 02/19/2023 CLINICAL DATA:  Altered mental status EXAM: CHEST - 2 VIEW COMPARISON:  Chest x-ray April 23, 2021 FINDINGS: The cardiomediastinal silhouette is unchanged in contour. Right basilar bandlike opacities. No pleural effusion or pneumothorax. The visualized upper abdomen is unremarkable. No acute osseous abnormality. IMPRESSION: Right basilar bandlike opacities, likely atelectasis. Electronically Signed   By: Jacob Moores M.D.   On:  02/19/2023 08:36    EKG: Independently reviewed.  Sinus rhythm, no acute ST changes.  Assessment/Plan Principal Problem:   Encephalopathy Active Problems:   Asthma   Altered mental status  (please populate well all problems here in Problem List. (For example, if patient is on BP meds at home and you resume or decide to hold them, it is a problem that needs to be her. Same for CAD, COPD, HLD and so on)  Acute metabolic encephalopathy -Clinically suspect polypharmacy from overuse/oversensitive of Ambien, which confirmed by ED physician who went through with her pharmacy over the phone.  Recommend discontinue Ambien, patient agreed.  Brain MRI negative for acute intracranial findings.  Other etiologies such as UTI, given the UA findings.  Acute hypoxic respiratory failure -Secondary to poorly controlled asthma -Start prednisone 40 mg daily -Continue Trelegy -Bronchodilators every 6 hours plus as needed albuterol -Recheck ambulatory pulse ox tomorrow morning, may need O2 evaluation.  Chest pain -I rule out ACS, troponin sent EKG showed no ischemic -Echocardiogram ordered -Trial of SL nitroglycerin.  Allergy to NSAIDS, will start Plavix until ACS ruled out. -Consider outpatient stress test  HTN -Controlled, continue nebivolol and hydrochlorothiazide  Hypokalemia -P.o. replacement  Anxiety/depression -Continue Celexa  DVT prophylaxis: Lovenox Code Status: Full code Family Communication: None at bedside Disposition Plan: Expected less than 2 midnight hospital stay Consults called: None Admission status: Tele obs   Emeline General MD [facility] Pager [phone]  02/19/2023, 5:08 PM

## 2023-02-19 NOTE — ED Notes (Signed)
ED TO INPATIENT HANDOFF REPORT  ED Nurse Name and Phone #: Oceane Fosse [phone]  S Name/Age/Gender Renee Adkins 77 y.o. female Room/Bed: 046C/046C  Code Status   Code Status: Full Code  Home/SNF/Other Home Patient oriented to: self, place, and situation Is this baseline? No   Triage Complete: Triage complete  Chief Complaint Encephalopathy [G93.40]  Triage Note Pt bib ems ; found outside by neighbor; pt unsure how she got there; alert to person and place but not time or event, event; pt c/o leg and knee pain with ems, chronic; denies pain currently; equal grip strength; no other neuro deficits; no other complaints; cbg 98, HR 66, BP 148/83, sats 97% RA; denies fever, no recent illness, denies urinary symptoms; 12 lead unremarkable   Allergies Allergies  Allergen Reactions   Azithromycin Shortness Of Breath and Swelling   Codeine Nausea And Vomiting and Other (See Comments)    REACTION: tachycardia   Levofloxacin Shortness Of Breath   Meloxicam Shortness Of Breath   Penicillins Anaphylaxis   Naproxen Nausea And Vomiting    Level of Care/Admitting Diagnosis ED Disposition     ED Disposition  Admit   Condition  --   Comment  Hospital Area: [facility] [[id number]]  Level of Care: Telemetry Medical [104]  May place patient in observation at [facility] if equivalent level of care is available:: No  Covid Evaluation: Asymptomatic - no recent exposure (last 10 days) testing not required  Diagnosis: Encephalopathy [291958]  Admitting Physician: Emeline General [[id number]]  Attending Physician: Emeline General [[id number]]          B Medical/Surgery History Past Medical History:  Diagnosis Date   Anxiety 01/25/2012   Asthma    Chronic diastolic congestive heart failure (HCC) 01/29/2012   Hypertension    Paresthesia 01/27/2012   Pericarditis    Seizures (HCC)    Past Surgical History:  Procedure Laterality Date   COLONOSCOPY  03/2009   repeat  due 5-10 years   DG  BONE DENSITY ([facility] HX)  11/2014   normal   TOTAL KNEE ARTHROPLASTY  Jan 2011   rt knee   VESICOVAGINAL FISTULA CLOSURE W/ TAH       A IV Location/Drains/Wounds Patient Lines/Drains/Airways Status     Active Line/Drains/Airways     Name Placement date Placement time Site Days   Peripheral IV 02/19/23 20 G Right;Posterior Hand 02/19/23  0808  Hand  less than 1            Intake/Output Last 24 hours No intake or output data in the 24 hours ending 02/19/23 1723  Labs/Imaging Results for orders placed or performed during the hospital encounter of 02/19/23 (from the past 48 hour(s))  CBC with Differential     Status: Abnormal   Collection Time: 02/19/23  8:09 AM  Result Value Ref Range   WBC 8.4 4.0 - 10.5 K/uL   RBC 4.51 3.87 - 5.11 MIL/uL   Hemoglobin 13.8 12.0 - 15.0 g/dL   HCT 19.1 47.8 - 29.5 %   MCV 93.3 80.0 - 100.0 fL   MCH 30.6 26.0 - 34.0 pg   MCHC 32.8 30.0 - 36.0 g/dL   RDW 62.1 30.8 - 65.7 %   Platelets 214 150 - 400 K/uL   nRBC 0.0 0.0 - 0.2 %   Neutrophils Relative % 48 %   Neutro Abs 4.1 1.7 - 7.7 K/uL   Lymphocytes Relative 40 %   Lymphs Abs  3.3 0.7 - 4.0 K/uL   Monocytes Relative 7 %   Monocytes Absolute 0.6 0.1 - 1.0 K/uL   Eosinophils Relative 3 %   Eosinophils Absolute 0.2 0.0 - 0.5 K/uL   Basophils Relative 1 %   Basophils Absolute 0.1 0.0 - 0.1 K/uL   Immature Granulocytes 1 %   Abs Immature Granulocytes 0.10 (H) 0.00 - 0.07 K/uL    Comment: Performed at [facility], [address], [place], [place] [address]  Comprehensive metabolic panel     Status: Abnormal   Collection Time: 02/19/23  8:09 AM  Result Value Ref Range   Sodium 142 135 - 145 mmol/L   Potassium 3.2 (L) 3.5 - 5.1 mmol/L   Chloride 102 98 - 111 mmol/L   CO2 24 22 - 32 mmol/L   Glucose, Bld 96 70 - 99 mg/dL    Comment: Glucose reference range applies only to samples taken after fasting for at least 8 hours.   BUN 17 8 - 23 mg/dL   Creatinine, Ser  5.57 (H) 0.44 - 1.00 mg/dL   Calcium 9.4 8.9 - 32.2 mg/dL   Total Protein 6.8 6.5 - 8.1 g/dL   Albumin 3.8 3.5 - 5.0 g/dL   AST 17 15 - 41 U/L   ALT 15 0 - 44 U/L   Alkaline Phosphatase 44 38 - 126 U/L   Total Bilirubin 0.7 0.3 - 1.2 mg/dL   GFR, Estimated 45 (L) >60 mL/min    Comment: (NOTE) Calculated using the CKD-EPI Creatinine Equation (2021)    Anion gap 16 (H) 5 - 15    Comment: Performed at [facility], [address], [place], [place] [address]  CBG monitoring, ED     Status: None   Collection Time: 02/19/23  8:12 AM  Result Value Ref Range   Glucose-Capillary 85 70 - 99 mg/dL    Comment: Glucose reference range applies only to samples taken after fasting for at least 8 hours.  Urinalysis, Routine w reflex microscopic -Urine, Clean Catch     Status: Abnormal   Collection Time: 02/19/23 10:05 AM  Result Value Ref Range   Color, Urine YELLOW YELLOW   APPearance HAZY (A) CLEAR   Specific Gravity, Urine 1.023 1.005 - 1.030   pH 5.0 5.0 - 8.0   Glucose, UA NEGATIVE NEGATIVE mg/dL   Hgb urine dipstick NEGATIVE NEGATIVE   Bilirubin Urine NEGATIVE NEGATIVE   Ketones, ur NEGATIVE NEGATIVE mg/dL   Protein, ur NEGATIVE NEGATIVE mg/dL   Nitrite NEGATIVE NEGATIVE   Leukocytes,Ua MODERATE (A) NEGATIVE   RBC / HPF 0-5 0 - 5 RBC/hpf   WBC, UA 0-5 0 - 5 WBC/hpf   Bacteria, UA RARE (A) NONE SEEN   Squamous Epithelial / HPF 6-10 0 - 5 /HPF   Mucus PRESENT     Comment: Performed at [facility], [address] [place], [place] [address]  D-dimer, quantitative     Status: None   Collection Time: 02/19/23 10:48 AM  Result Value Ref Range   D-Dimer, Quant 0.31 0.00 - 0.50 ug/mL-FEU    Comment: (NOTE) At the manufacturer cut-off value of 0.5 g/mL FEU, this assay has a negative predictive value of 95-100%.This assay is intended for use in conjunction with a clinical pretest probability (PTP) assessment model to exclude pulmonary embolism (PE) and deep venous  thrombosis (DVT) in outpatients suspected of PE or DVT. Results should be correlated with clinical presentation. Performed at [facility], [address] [place], [place] [address]    MR BRAIN WO CONTRAST  Result Date: 02/19/2023 CLINICAL DATA:  Altered mental status EXAM: MRI HEAD WITHOUT CONTRAST TECHNIQUE: Multiplanar, multiecho pulse sequences of the brain and surrounding structures were obtained without intravenous contrast. COMPARISON:  Same-day CT head FINDINGS: Brain: There is no acute intracranial hemorrhage, extra-axial fluid collection, or acute infarct Parenchymal volume is normal for age. The ventricles are normal in size. There is minimal background chronic small-vessel ischemic change. The pituitary and suprasellar region are normal. There is no mass lesion. There is no mass effect or midline shift. Vascular: Normal flow voids. Skull and upper cervical spine: Normal marrow signal. Sinuses/Orbits: There is hyperostosis of the maxillary sinus walls consistent with sequela of chronic sinusitis. The globes and orbits are unremarkable. Other: There is trace fluid in the left mastoid tip. IMPRESSION: Normal for age appearance of the brain with no acute intracranial pathology. Electronically Signed   By: Lesia Hausen M.D.   On: 02/19/2023 12:47   CT Head Wo Contrast  Result Date: 02/19/2023 CLINICAL DATA:  77 year old female with altered mental status. EXAM: CT HEAD WITHOUT CONTRAST TECHNIQUE: Contiguous axial images were obtained from the base of the skull through the vertex without intravenous contrast. RADIATION DOSE REDUCTION: This exam was performed according to the departmental dose-optimization program which includes automated exposure control, adjustment of the mA and/or kV according to patient size and/or use of iterative reconstruction technique. COMPARISON:  Head CT 09/27/2022. FINDINGS: Brain: Cerebral volume is stable, normal for age. No midline shift, ventriculomegaly, mass  effect, evidence of mass lesion, intracranial hemorrhage or evidence of cortically based acute infarction. Gray-white differentiation is stable and normal for age. Vascular: No suspicious intracranial vascular hyperdensity. Calcified atherosclerosis at the skull base. Skull: No acute osseous abnormality identified. Sinuses/Orbits: Chronic maxillary sinusitis with mucoperiosteal thickening appears stable. Other Visualized paranasal sinuses and mastoids are stable and well aerated. Other: No acute orbit or scalp soft tissue finding. IMPRESSION: 1. Stable and normal for age noncontrast CT appearance of the brain. 2. Chronic maxillary sinusitis. Electronically Signed   By: Odessa Fleming M.D.   On: 02/19/2023 09:37   DG Chest 2 View  Result Date: 02/19/2023 CLINICAL DATA:  Altered mental status EXAM: CHEST - 2 VIEW COMPARISON:  Chest x-ray April 23, 2021 FINDINGS: The cardiomediastinal silhouette is unchanged in contour. Right basilar bandlike opacities. No pleural effusion or pneumothorax. The visualized upper abdomen is unremarkable. No acute osseous abnormality. IMPRESSION: Right basilar bandlike opacities, likely atelectasis. Electronically Signed   By: Jacob Moores M.D.   On: 02/19/2023 08:36    Pending Labs Unresulted Labs (From admission, onward)     Start     Ordered   02/20/23 0500  Basic metabolic panel  Tomorrow morning,   R        02/19/23 1548            Vitals/Pain Today's Vitals   02/19/23 1630 02/19/23 1645 02/19/23 1700 02/19/23 1715  BP: (!) 155/71 (!) 139/97 (!) 147/80 (!) 145/87  Pulse: 87 88 85 86  Resp: (!) 22 (!) 22 (!) 25 15  Temp:      TempSrc:      SpO2: 92% 92% 93% 93%  PainSc:        Isolation Precautions No active isolations  Medications Medications  hydrochlorothiazide (HYDRODIURIL) tablet 12.5 mg (has no administration in time range)  Nebivolol HCl TABS 20 mg (has no administration in time range)  hydrALAZINE (APRESOLINE) tablet 25 mg (has no  administration in time range)  citalopram (CELEXA) tablet 40 mg (has no administration in time range)  melatonin tablet 3 mg (has no administration in time range)  pantoprazole (PROTONIX) EC tablet 40 mg (has no administration in time range)  methocarbamol (ROBAXIN) tablet 500 mg (has no administration in time range)  albuterol (VENTOLIN HFA) 108 (90 Base) MCG/ACT inhaler 2-4 puff (has no administration in time range)  fluticasone furoate-vilanterol (BREO ELLIPTA) 100-25 MCG/ACT 1 puff (has no administration in time range)  predniSONE (DELTASONE) tablet 40 mg (has no administration in time range)  enoxaparin (LOVENOX) injection 40 mg (has no administration in time range)  potassium chloride SA (KLOR-CON M) CR tablet 40 mEq (has no administration in time range)  nitroGLYCERIN (NITROSTAT) SL tablet 0.4 mg (has no administration in time range)  clopidogrel (PLAVIX) tablet 75 mg (has no administration in time range)  sodium chloride 0.9 % bolus 500 mL (0 mLs Intravenous Stopped 02/19/23 1007)  ipratropium-albuterol (DUONEB) 0.5-2.5 (3) MG/3ML nebulizer solution 3 mL (3 mLs Nebulization Given 02/19/23 1012)  methylPREDNISolone sodium succinate (SOLU-MEDROL) 125 mg/2 mL injection 125 mg (125 mg Intravenous Given 02/19/23 1048)  albuterol (PROVENTIL) (2.5 MG/3ML) 0.083% nebulizer solution 2.5 mg (2.5 mg Nebulization Given 02/19/23 1439)    Mobility walks     Focused Assessments Neuro Assessment Handoff:  Swallow screen pass? Yes  Cardiac Rhythm: Normal sinus rhythm NIH Stroke Scale  Level of Consciousness (1a.)   : Alert, keenly responsive LOC Questions (1b. )   : Answers one question correctly LOC Commands (1c. )   : Performs both tasks correctly Best Gaze (2. )  : Normal Visual (3. )  : No visual loss Facial Palsy (4. )    : Normal symmetrical movements Motor Arm, Left (5a. )   : No drift Motor Arm, Right (5b. ) : No drift Motor Leg, Left (6a. )  : No drift Motor Leg, Right (6b. ) : No  drift Limb Ataxia (7. ): Absent Sensory (8. )  : Normal, no sensory loss Best Language (9. )  : No aphasia Dysarthria (10. ): Normal Extinction/Inattention (11.)   : No Abnormality Complete NIHSS TOTAL: 1     Neuro Assessment: Exceptions to WDL Neuro Checks:      Has TPA been given? No If patient is a Neuro Trauma and patient is going to OR before floor call report to 4N Charge nurse: [phone] or [phone]   R Recommendations: See Admitting Provider Note  Report given to:   Additional Notes:

## 2023-02-20 ENCOUNTER — Encounter (HOSPITAL_COMMUNITY): Payer: Self-pay | Admitting: Internal Medicine

## 2023-02-20 ENCOUNTER — Other Ambulatory Visit (HOSPITAL_COMMUNITY): Payer: Self-pay

## 2023-02-20 DIAGNOSIS — G9340 Encephalopathy, unspecified: Secondary | ICD-10-CM | POA: Diagnosis not present

## 2023-02-20 LAB — BASIC METABOLIC PANEL
Anion gap: 10 (ref 5–15)
BUN: 18 mg/dL (ref 8–23)
CO2: 24 mmol/L (ref 22–32)
Calcium: 9 mg/dL (ref 8.9–10.3)
Chloride: 103 mmol/L (ref 98–111)
Creatinine, Ser: 1.26 mg/dL — ABNORMAL HIGH (ref 0.44–1.00)
GFR, Estimated: 44 mL/min — ABNORMAL LOW (ref >60)
Glucose, Bld: 136 mg/dL — ABNORMAL HIGH (ref 70–99)
Potassium: 3.6 mmol/L (ref 3.5–5.1)
Sodium: 137 mmol/L (ref 135–145)

## 2023-02-20 LAB — TROPONIN I (HIGH SENSITIVITY): Troponin I (High Sensitivity): 7 ng/L (ref <18)

## 2023-02-20 MED ORDER — ALBUTEROL SULFATE (2.5 MG/3ML) 0.083% IN NEBU
2.5000 mg | INHALATION_SOLUTION | RESPIRATORY_TRACT | 2 refills | Status: DC | PRN
  Filled 2023-02-20: qty 90, 5d supply, fill #0

## 2023-02-20 NOTE — TOC Transition Note (Signed)
Transition of Care [facility]) - CM/SW Discharge Note   Patient Details  Name: Renee Adkins MRN: [id number] Date of Birth: 1946-04-18  [facility] CM/SW Contact:  Kermit Balo, RN Phone Number: 02/20/2023, 2:15 PM   Clinical Narrative:    Pt is discharging home with self care. No f/u per PT.  Nebulizer machine ordered through [facility] and will be delivered to the room. Cab voucher provided to the bedside RN for transport home.   Final next level of care: Home/Self Care Barriers to Discharge: No Barriers Identified   Patient Goals and CMS Choice      Discharge Placement                         Discharge Plan and Services Additional resources added to the After Visit Summary for                                       Social Determinants of Health (SDOH) Interventions SDOH Screenings   Food Insecurity: No Food Insecurity (02/19/2023)  Housing: Low Risk  (02/19/2023)  Transportation Needs: No Transportation Needs (02/19/2023)  Utilities: Not At Risk (02/19/2023)  Tobacco Use: Low Risk  (02/20/2023)     Readmission Risk Interventions     No data to display
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
## 2023-02-20 NOTE — Progress Notes (Signed)
Writer went over discharge instructions with patient at this time, no further questions, PIV removed.

## 2023-02-20 NOTE — Evaluation (Signed)
Physical Therapy Evaluation Patient Details Name: Renee Adkins MRN: [id number] DOB: 1945/08/13 Today's Date: 02/20/2023  History of Present Illness  Pt is 77 year old presented to [facility] on  02/19/23 for actute metabolic encephalopathy. PMH - asthma, htn, anxiety/depression, chf  Clinical Impression  Pt presents to PT with activity limitations due to SOB. Pt becomes SOB with activity and has to stop and catch her breath frequently. Pt's SpO2 95-98% throughout activity and at rest. Balance and strength are good. Pt reports LLE pain for which she has been seeing and orthopedist for.  Per chart pt has been having dyspnea x 3 months with activity. Currently not far from current baseline and expect she will continue to need frequent breaks at home with any activity.        Assistance Recommended at Discharge PRN  If plan is discharge home, recommend the following:  Can travel by private vehicle  Assistance with cooking/housework;Assist for transportation        Equipment Recommendations None recommended by PT  Recommendations for Other Services       Functional Status Assessment Patient has not had a recent decline in their functional status     Precautions / Restrictions        Mobility  Bed Mobility Overal bed mobility: Independent                  Transfers Overall transfer level: Modified independent Equipment used: Rolling walker (2 wheels), None                    Ambulation/Gait Ambulation/Gait assistance: Supervision Gait Distance (Feet): 60 Feet Assistive device: Rolling walker (2 wheels) Gait Pattern/deviations: Step-through pattern, Decreased stride length Gait velocity: decr Gait velocity interpretation: <1.31 ft/sec, indicative of household ambulator   General Gait Details: Steady gait with walker. Pt c/o Lt thigh pain. Had to stop every 10-20' to catch her breath. SpO2 95-98% on RA  Stairs            Wheelchair Mobility     Tilt  Bed    Modified Rankin (Stroke Patients Only)       Balance Overall balance assessment: Mild deficits observed, not formally tested                                           Pertinent Vitals/Pain Pain Assessment Pain Assessment: Faces Faces Pain Scale: Hurts little more Pain Location: Lt thigh Pain Descriptors / Indicators: Guarding, Grimacing Pain Intervention(s): Limited activity within patient's tolerance, Monitored during session    Home Living Family/patient expects to be discharged to:: Private residence Living Arrangements: Alone Available Help at Discharge: Available PRN/intermittently Type of Home: House Home Access: Stairs to enter Entrance Stairs-Rails: Right;Left;Can reach both Entrance Stairs-Number of Steps: 4   Home Layout: One level Home Equipment: Agricultural consultant (2 wheels);Cane - quad      Prior Function Prior Level of Function : Independent/Modified Independent             Mobility Comments: Uses quad cane or walker if LLE is hurting. Limited by SOB       Hand Dominance   Dominant Hand: Right    Extremity/Trunk Assessment   Upper Extremity Assessment Upper Extremity Assessment: Overall WFL for tasks assessed    Lower Extremity Assessment Lower Extremity Assessment: Overall WFL for tasks assessed  Communication   Communication: No difficulties  Cognition Arousal/Alertness: Awake/alert Behavior During Therapy: WFL for tasks assessed/performed Overall Cognitive Status: Within Functional Limits for tasks assessed                                          General Comments General comments (skin integrity, edema, etc.): SpO2 95-98% throughout session with activity on RA    Exercises     Assessment/Plan    PT Assessment Patient does not need any further PT services  PT Problem List         PT Treatment Interventions      PT Goals (Current goals can be found in the Care Plan section)   Acute Rehab PT Goals Patient Stated Goal: be able to breathe PT Goal Formulation: All assessment and education complete, DC therapy    Frequency       Co-evaluation               AM-PAC PT "6 Clicks" Mobility  Outcome Measure Help needed turning from your back to your side while in a flat bed without using bedrails?: None Help needed moving from lying on your back to sitting on the side of a flat bed without using bedrails?: None Help needed moving to and from a bed to a chair (including a wheelchair)?: None Help needed standing up from a chair using your arms (e.g., wheelchair or bedside chair)?: None Help needed to walk in hospital room?: None Help needed climbing 3-5 steps with a railing? : A Little 6 Click Score: 23    End of Session Equipment Utilized During Treatment: Gait belt Activity Tolerance: Other (comment) (Limited by SOB) Patient left: in bed;with call bell/phone within reach;with bed alarm set   PT Visit Diagnosis: Other (comment) (Limited activity tolerance due to SOB)    Time: 8657-8469 PT Time Calculation (min) (ACUTE ONLY): 30 min   Charges:   PT Evaluation $PT Eval Moderate Complexity: 1 Mod PT Treatments $Gait Training: 8-22 mins PT General Charges $$ ACUTE PT VISIT: 1 Visit         [facility] PT Acute Rehabilitation Services Office [phone]   Angelina Ok [facility] 02/20/2023, 2:01 PM

## 2023-02-20 NOTE — Plan of Care (Signed)

## 2023-02-20 NOTE — Plan of Care (Signed)
  Problem: Education: Goal: Knowledge of General Education information will improve Description Including pain rating scale, medication(s)/side effects and non-pharmacologic comfort measures Outcome: Progressing   Problem: Health Behavior/Discharge Planning: Goal: Ability to manage health-related needs will improve Outcome: Progressing   

## 2023-02-20 NOTE — Plan of Care (Signed)
  Problem: Education: Goal: Knowledge of disease or condition will improve Outcome: Progressing Goal: Knowledge of the prescribed therapeutic regimen will improve Outcome: Progressing   Problem: Education: Goal: Knowledge of General Education information will improve Description: Including pain rating scale, medication(s)/side effects and non-pharmacologic comfort measures Outcome: Progressing   Problem: Health Behavior/Discharge Planning: Goal: Ability to manage health-related needs will improve Outcome: Progressing   

## 2023-05-07 ENCOUNTER — Ambulatory Visit: Payer: Medicare Other | Admitting: Pulmonary Disease

## 2023-05-07 ENCOUNTER — Encounter: Payer: Self-pay | Admitting: Pulmonary Disease

## 2023-05-07 VITALS — BP 138/76 | HR 71 | Temp 97.3°F | Ht 61.0 in | Wt 156.8 lb

## 2023-05-07 DIAGNOSIS — J432 Centrilobular emphysema: Secondary | ICD-10-CM | POA: Diagnosis not present

## 2023-05-07 DIAGNOSIS — J4540 Moderate persistent asthma, uncomplicated: Secondary | ICD-10-CM | POA: Diagnosis not present

## 2023-05-07 HISTORY — DX: Centrilobular emphysema: J43.2

## 2023-05-07 MED ORDER — TRELEGY ELLIPTA 100-62.5-25 MCG/ACT IN AEPB: 1.0000 | INHALATION_SPRAY | Freq: Every day | RESPIRATORY_TRACT | 11 refills | Status: DC

## 2023-05-07 NOTE — Progress Notes (Signed)
@Patient  ID: Renee Adkins, female    DOB: [birth year], 77 y.o.   MRN: [id number]  Chief Complaint  Patient presents with   Follow-up    Breathing is better ACT 18     Referring provider: Aliene Beams, MD  HPI:   77 y.o. whom we are seeing in follow-up for evaluation of  dyspnea on exertion felt largely due to asthma as well as emphysema seen on CT scan.  Discharge summary 02/2023 reviewed.  Patient returns for follow-up.  Previously on Advair.  Prior PFT showed air trapping.  Poorly controlled symptoms at that time in the setting of recent pneumonia.  Escalated triple inhaled therapy per guidelines with hospitalization due to asthma.  Advair was stopped started on Trelegy low-dose.  This helped immensely.  Less short of breath.  More energy.  Feels like it has been much improved with this medication.  Hospitalized 02/2023 with polypharmacy, altered mental status.  Medication issues resolved.  She is doing much better.  HPI at initial visit Patient has been diagnosed with pneumonia 02/2021 based on chest x-ray.  At that time she had fever, productive cough, mild dyspnea on exertion.  She recalls being sick to her stomach, vomiting in hours to days preceding symptoms.  Seen by her PCP.  Chest x-ray obtained 02/27/2021 on my review interpretation reveals right basilar left lower hazy infiltrate.  She received antibiotics.  Symptoms improved with still had some ongoing shortness of breath.  Repeat chest x-ray 03/15/2021 reviewed and on my interpretation showed improved bibasilar infiltrates with persistent linear infiltrate favored to represent atelectasis versus evolving or improving pneumonia.  She has dyspnea on exertion.  Present with inclines or stairs.  No time of day when things are better or worse.  No positional things are better or worse.  No seasonal environmental factors that she can identify to make things better or worse.  She uses her albuterol as needed with improvement.  Using  about 3 times a week, sometimes more.  She continues on budesonide nebs twice daily.  No other alleviating or exacerbating factors.  PMH: Asthma, GERD, hypertension Surgical history: Knee replacement, vesicovaginal fistula closure Family history: CAD and father, CAD in brother, lung cancer in brother Social history: Never smoker, lives in [place]   Questionaires / Pulmonary Flowsheets:   ACT:  Asthma Control Test ACT Total Score  05/07/2023  9:18 AM 18  04/23/2021  4:15 PM 8    MMRC:     No data to display          Epworth:      No data to display          Tests:   FENO:  No results found for: "NITRICOXIDE"  PFT:    Latest Ref Rng & Units 05/02/2021    3:41 PM  PFT Results  FVC-Pre L 1.91   FVC-Predicted Pre % 104   FVC-Post L 2.08   FVC-Predicted Post % 112   Pre FEV1/FVC % % 68   Post FEV1/FCV % % 70   FEV1-Pre L 1.31   FEV1-Predicted Pre % 92   FEV1-Post L 1.45   DLCO uncorrected ml/min/mmHg 9.62   DLCO UNC% % 56   DLCO corrected ml/min/mmHg 9.62   DLCO COR %Predicted % 56   DLVA Predicted % 70   TLC L 5.58   TLC % Predicted % 121   RV % Predicted % 168   Personally reviewed and interpreted as spirometry normal, no significant bronchodilator spots,  lung volumes consistent with hyperinflation and air trapping, DLCO moderately reduced.  WALK:      No data to display          Imaging: Personally reviewed and as per EMR discussion this note No results found.  Lab Results: Personally reviewed, notably eosinophils elevated in the past CBC    Component Value Date/Time   WBC 8.4 02/19/2023 0809   RBC 4.51 02/19/2023 0809   HGB 13.8 02/19/2023 0809   HCT 42.1 02/19/2023 0809   PLT 214 02/19/2023 0809   MCV 93.3 02/19/2023 0809   MCH 30.6 02/19/2023 0809   MCHC 32.8 02/19/2023 0809   RDW 13.3 02/19/2023 0809   LYMPHSABS 3.3 02/19/2023 0809   MONOABS 0.6 02/19/2023 0809   EOSABS 0.2 02/19/2023 0809   BASOSABS 0.1 02/19/2023 0809     BMET    Component Value Date/Time   NA 137 02/20/2023 0756   K 3.6 02/20/2023 0756   CL 103 02/20/2023 0756   CO2 24 02/20/2023 0756   GLUCOSE 136 (H) 02/20/2023 0756   BUN 18 02/20/2023 0756   CREATININE 1.26 (H) 02/20/2023 0756   CALCIUM 9.0 02/20/2023 0756   GFRNONAA 44 (L) 02/20/2023 0756   GFRAA >60 04/21/2016 0901    BNP    Component Value Date/Time   BNP 38.1 04/20/2016 0850    ProBNP    Component Value Date/Time   PROBNP 59.4 11/10/2013 1632    Specialty Problems       Pulmonary Problems   Asthma    Followed in Pulmonary clinic/ [facility]/ [place]     - PFT's 03/17/09  FEV1  1.55 (71%) ratio 65 and no better p B2,  DlCO 44 > corrects to 81%    Replacing diagnoses that were inactivated after the 11/11/22 regulatory import      COUGH    Qualifier: Diagnosis of  By: Sherene Sires MD, Casimiro Needle B       Asthma   Hypoxia    Allergies  Allergen Reactions   Azithromycin Shortness Of Breath and Swelling   Codeine Nausea And Vomiting and Other (See Comments)    REACTION: tachycardia   Levofloxacin Shortness Of Breath   Meloxicam Shortness Of Breath   Penicillins Anaphylaxis   Naproxen Nausea And Vomiting    Immunization History  Administered Date(s) Administered   PFIZER(Purple Top)SARS-COV-2 Vaccination 09/16/2019, 10/07/2019   Tdap 05/17/2014    Past Medical History:  Diagnosis Date   Anxiety 01/25/2012   Asthma    Chronic diastolic congestive heart failure (HCC) 01/29/2012   Hypertension    Paresthesia 01/27/2012   Pericarditis    Seizures (HCC)     Tobacco History: Social History   Tobacco Use  Smoking Status Never  Smokeless Tobacco Never   Counseling given: Not Answered   Continue to not smoke  Outpatient Encounter Medications as of 05/07/2023  Medication Sig   albuterol (PROVENTIL HFA;VENTOLIN HFA) 108 (90 BASE) MCG/ACT inhaler Inhale 2-4 puffs into the lungs every 4 (four) hours as needed for wheezing or shortness of breath.    albuterol (PROVENTIL) (2.5 MG/3ML) 0.083% nebulizer solution Take 3 mLs (2.5 mg total) by nebulization every 4 (four) hours as needed for wheezing or shortness of breath.   Cholecalciferol ([employer] VIT D 5000 HIGH-POTENCY PO) Take 5,000 Units by mouth daily.   citalopram (CELEXA) 40 MG tablet Take 40 mg by mouth daily.   hydrochlorothiazide (HYDRODIURIL) 25 MG tablet Take 12.5 mg by mouth daily.   methocarbamol (ROBAXIN) 500  MG tablet Take 1 tablet (500 mg total) by mouth 2 (two) times daily as needed for up to 14 doses for muscle spasms.   Nebivolol HCl 20 MG TABS Take 20 mg by mouth every morning.   pantoprazole (PROTONIX) 40 MG tablet Take 40 mg by mouth daily.   polyvinyl alcohol (LIQUIFILM TEARS) 1.4 % ophthalmic solution Place 1 drop into both eyes daily as needed for dry eyes.   promethazine-dextromethorphan (PROMETHAZINE-DM) 6.25-15 MG/5ML syrup Take 5 mLs by mouth 4 (four) times daily as needed for cough.   traMADol (ULTRAM) 50 MG tablet Take 1 tablet (50 mg total) by mouth every 8 (eight) hours as needed for severe pain.   vitamin C (ASCORBIC ACID) 500 MG tablet Take 500 mg by mouth daily.   [DISCONTINUED] TRELEGY ELLIPTA 100-62.5-25 MCG/ACT AEPB Inhale 1 puff into the lungs daily as directed.   diazepam (VALIUM) 5 MG tablet Take 2.5-5 mg by mouth daily as needed. (Patient not taking: Reported on 05/07/2023)   Fluticasone-Umeclidin-Vilant (TRELEGY ELLIPTA) 100-62.5-25 MCG/ACT AEPB Inhale 1 puff into the lungs daily.   terconazole (TERAZOL 7) 0.4 % vaginal cream Place 1 applicator vaginally at bedtime. (Patient not taking: Reported on 05/07/2023)   No facility-administered encounter medications on file as of 05/07/2023.     Review of Systems  Review of Systems  N/a Physical Exam  BP 138/76 (BP Location: Left Arm, Patient Position: Sitting, Cuff Size: Normal)   Pulse 71   Temp (!) 97.3 F (36.3 C) (Temporal)   Ht 5\' 1"  (1.549 m)   Wt 156 lb 12.8 oz (71.1 kg)   SpO2 95%   BMI 29.63  kg/m   Wt Readings from Last 5 Encounters:  05/07/23 156 lb 12.8 oz (71.1 kg)  10/17/22 168 lb 12.8 oz (76.6 kg)  09/27/22 134 lb (60.8 kg)  08/29/21 142 lb (64.4 kg)  04/23/21 143 lb 9.6 oz (65.1 kg)    BMI Readings from Last 5 Encounters:  05/07/23 29.63 kg/m  10/17/22 31.89 kg/m  09/27/22 25.32 kg/m  08/29/21 26.83 kg/m  04/23/21 27.13 kg/m     Physical Exam General: Sitting in chair, in no acute distress Eyes: EOMI, icterus Neck: Supple, no JVP Pulmonary: Clear, normal work of breathing Cardiovascular: Regular rate and rhythm, no edema, warm Abdomen: Soft, bowel sounds present MSK: No synovitis, no joint effusion Neuro: Normal gait, no weakness Psych: Normal mood, full affect   Assessment & Plan:    Dyspnea on exertion: Suspect multifactorial related to deconditioning, possible poorly controlled asthma versus emphysema.  PFTs reveal hyperinflation and air trapping consistent with small airways disease, emphysema on imaging.  No fixed obstruction.  Advair with mild to moderate improvement in symptoms.  Given hyperinflation, additional bronchodilator added, escalated to Trelegy mid dose 1 puff daily spring 2024.  Has helped immensely in terms of symptoms.  Asthma: Clinical diagnosis given some environmental factors, atopic symptoms, hyperinflation with lack of fixed obstruction on PFTs.  Warranting triple inhaled therapy with severity of symptoms as well as recurrent exacerbations, ED visits early 2024.  Continue Trelegy, refilled today.   Return in about 6 months (around 11/04/2023) for f/u Dr. Judeth Horn.   Karren Burly, MD 05/07/2023

## 2023-05-07 NOTE — Patient Instructions (Signed)
Nice to see you  Continue Trelegy 1 puff once a day - rinse mouth after using  Return to clinic in 6 months or sooner as needed with Dr. Judeth Horn

## 2023-06-04 NOTE — Progress Notes (Unsigned)
Cardiology Office Note:    Date:  06/05/2023  NAME:  Renee Adkins    MRN: [id number] DOB:  12/03/1945   PCP:  Renee Beams, MD  Former Cardiology Providers: Dr. Elease Adkins Primary Cardiologist:  Renee Lerner, DO, Renee Adkins (established care 06/05/2023) Electrophysiologist:  None   Referring MD: Renee Beams, MD  Reason of Consult: Pre-op   Chief Complaint  Patient presents with   Pre-op Exam   Establish Care    History of Present Illness:    Renee Adkins is a 77 y.o. African-American female whose past medical history and cardiovascular risk factors includes: history of chronic diastolic heart failure, right carpal tunnel syndrome, hypertension with chronic kidney disease, asthma. She is being seen today for the evaluation of Pre-op and establish care at the request of Renee Beams, MD.  Patient was referred to the practice for preoperative risk assessment prior to upcoming back surgery with Dr. Hoyt Adkins.  Patient does not know exactly the name of the surgery that is being planned for.  Date of surgery is to be determined.  She is seen couple a cardiologist in his practice dating back to 2013 and based on EMR was diagnosed with chronic diastolic heart failure.  She has been lost to follow-up since then and is here to reestablish care as well.  Patient denies anginal chest pain at rest or with effort related activities. But she does endorse shortness of breath.  The shortness of breath is present at rest and also more prominent with exertion.  It improves with rest.  Denies orthopnea, PND, or lower extremity swelling.  Overall her dyspnea has worsened over the year.  Her functional capacity is limited to the exercises illustrated to her by her physical therapy and also ambulates 1-2 blocks on a regular basis.  Current Medications: Current Meds  Medication Sig   acetaminophen (TYLENOL 8 HOUR) 650 MG CR tablet Take 650 mg by mouth every 8 (eight) hours.   albuterol  (PROVENTIL HFA;VENTOLIN HFA) 108 (90 BASE) MCG/ACT inhaler Inhale 2-4 puffs into the lungs every 4 (four) hours as needed for wheezing or shortness of breath.   albuterol (PROVENTIL) (2.5 MG/3ML) 0.083% nebulizer solution Take 3 mLs (2.5 mg total) by nebulization every 4 (four) hours as needed for wheezing or shortness of breath.   amLODipine (NORVASC) 5 MG tablet Take 5 mg by mouth daily.   Cholecalciferol ([employer]S VIT D 5000 HIGH-POTENCY PO) Take 5,000 Units by mouth daily.   citalopram (CELEXA) 40 MG tablet Take 40 mg by mouth daily.   diazepam (VALIUM) 5 MG tablet Take 2.5-5 mg by mouth daily as needed.   Fluticasone-Umeclidin-Vilant (TRELEGY ELLIPTA) 100-62.5-25 MCG/ACT AEPB Inhale 1 puff into the lungs daily.   gabapentin (NEURONTIN) 100 MG capsule Take 100 mg by mouth at bedtime.   hydrochlorothiazide (HYDRODIURIL) 25 MG tablet Take 12.5 mg by mouth daily.   loratadine (CLARITIN) 10 MG tablet Take 10 mg by mouth daily.   Nebivolol HCl 20 MG TABS Take 20 mg by mouth every morning.   pantoprazole (PROTONIX) 40 MG tablet Take 40 mg by mouth daily.   polyvinyl alcohol (LIQUIFILM TEARS) 1.4 % ophthalmic solution Place 1 drop into both eyes daily as needed for dry eyes.   potassium chloride (KLOR-CON) 10 MEQ tablet Take 10 mEq by mouth daily.   rosuvastatin (CRESTOR) 20 MG tablet Take 1 tablet (20 mg total) by mouth daily.   vitamin C (ASCORBIC ACID) 500 MG tablet Take 500 mg by mouth  daily.     Allergies:    Azithromycin, Codeine, Levofloxacin, Meloxicam, Penicillins, and Naproxen   Past Medical History: Past Medical History:  Diagnosis Date   Anxiety 01/25/2012   Asthma    Carpal tunnel syndrome    Chronic cystitis    Chronic diastolic congestive heart failure (HCC) 01/29/2012   CYSTITIS    Edema    Elevated WBC count    Hematochezia    History of colon polyps    Hypertension    Hypertensive kidney disease    Insomnia    Kidney disease, chronic, stage III (moderate, EGFR 30-59  ml/min) (HCC)    Low back pain    Lower extremity edema    Muscle tension headache    Osteopenia    Paresthesia 01/27/2012   Pericarditis    Seizures (HCC)     Past Surgical History: Past Surgical History:  Procedure Laterality Date   COLONOSCOPY  03/2009   repeat due 5-10 years   DG  BONE DENSITY ([facility] HX)  11/2014   normal   TOTAL KNEE ARTHROPLASTY  Jan 2011   rt knee   VESICOVAGINAL FISTULA CLOSURE W/ TAH      Social History: Social History   Tobacco Use   Smoking status: Never   Smokeless tobacco: Never  Substance Use Topics   Alcohol use: Yes    Alcohol/week: 0.0 standard drinks of alcohol    Comment: Socially    Drug use: No    Family History: Family History  Problem Relation Age of Onset   Heart attack Father 34   Heart attack Brother    Cancer - Lung Brother    Cancer Sister        liver   Asthma Neg Hx    Allergies Neg Hx     ROS:   Review of Systems  Cardiovascular:  Positive for dyspnea on exertion. Negative for chest pain, claudication, irregular heartbeat, leg swelling, near-syncope, orthopnea, palpitations, paroxysmal nocturnal dyspnea and syncope.  Respiratory:  Positive for shortness of breath.   Hematologic/Lymphatic: Negative for bleeding problem.  Musculoskeletal:  Negative for muscle cramps and myalgias.  Neurological:  Negative for dizziness and light-headedness.       Radicular pain on the left side from hip down to the leg    EKGs/Labs/Other Studies Reviewed:   EKG Interpretation Date/Time:  Thursday June 05 2023 08:57:58 EDT Ventricular Rate:  66 PR Interval:  136 QRS Duration:  62 QT Interval:  390 QTC Calculation: 408 R Axis:   62  Text Interpretation: Normal sinus rhythm Lead AvL not  Interpreted due to artifact. When compared with ECG of 19-Feb-2023 15:48, No significant change since last tracing Confirmed by Renee Adkins [phone]) on 06/05/2023 9:08:47 AM     Labs:    Latest Ref Rng & Units 02/19/2023    8:09 AM  09/27/2022    4:25 PM 04/21/2016    9:01 AM  CBC  WBC 4.0 - 10.5 K/uL 8.4   10.0   Hemoglobin 12.0 - 15.0 g/dL 03.4  74.2  59.5   Hematocrit 36.0 - 46.0 % 42.1  41.0  39.8   Platelets 150 - 400 K/uL 214   158        Latest Ref Rng & Units 02/20/2023    7:56 AM 02/19/2023    8:09 AM 09/27/2022    4:25 PM  BMP  Glucose 70 - 99 mg/dL 638  96  87   BUN 8 - 23 mg/dL 18  17  16   Creatinine 0.44 - 1.00 mg/dL 6.07  3.71  0.62   Sodium 135 - 145 mmol/L 137  142  142   Potassium 3.5 - 5.1 mmol/L 3.6  3.2  3.7   Chloride 98 - 111 mmol/L 103  102  102   CO2 22 - 32 mmol/L 24  24    Calcium 8.9 - 10.3 mg/dL 9.0  9.4        Latest Ref Rng & Units 02/20/2023    7:56 AM 02/19/2023    8:09 AM 09/27/2022    4:25 PM  CMP  Glucose 70 - 99 mg/dL 694  96  87   BUN 8 - 23 mg/dL 18  17  16    Creatinine 0.44 - 1.00 mg/dL 8.54  6.27  0.35   Sodium 135 - 145 mmol/L 137  142  142   Potassium 3.5 - 5.1 mmol/L 3.6  3.2  3.7   Chloride 98 - 111 mmol/L 103  102  102   CO2 22 - 32 mmol/L 24  24    Calcium 8.9 - 10.3 mg/dL 9.0  9.4    Total Protein 6.5 - 8.1 g/dL  6.8    Total Bilirubin 0.3 - 1.2 mg/dL  0.7    Alkaline Phos 38 - 126 U/L  44    AST 15 - 41 U/L  17    ALT 0 - 44 U/L  15      Lab Results  Component Value Date   CHOL 167 11/11/2013   HDL 59 11/11/2013   LDLCALC 84 11/11/2013   TRIG 121 11/11/2013   CHOLHDL 2.8 11/11/2013   No results for input(s): "LIPOA" in the last 8760 hours. No components found for: "NTPROBNP" No results for input(s): "PROBNP" in the last 8760 hours. No results for input(s): "TSH" in the last 8760 hours.  External Labs: Collected: February 2024 provided by referring physician BUN 18, creatinine 1.07. Sodium 142, potassium 4.1, chloride 102, bicarb 22. AST and ALT within normal limits. Hemoglobin 12.7 g/dL. TSH 2.32. Total cholesterol 215, triglycerides 91, HDL 69, calculated LDL 130, non-HDL 146  Collected: July 2024 provided by referring physician. BUN  19, creatinine 1.07.   Physical Exam:    Today's Vitals   06/05/23 0854  BP: (!) 140/62  Pulse: 66  Resp: 17  SpO2: 92%  Weight: 150 lb (68 kg)  Height: 5\' 1"  (1.549 m)   Body mass index is 28.34 kg/m. Wt Readings from Last 3 Encounters:  06/05/23 150 lb (68 kg)  05/07/23 156 lb 12.8 oz (71.1 kg)  10/17/22 168 lb 12.8 oz (76.6 kg)    Physical Exam  Constitutional: No distress.  hemodynamically stable  Eyes:  Arcus senilis  Neck: No JVD present.  Cardiovascular: Normal rate, regular rhythm, S1 normal, S2 normal, intact distal pulses and normal pulses. Exam reveals no gallop, no S3 and no S4.  No murmur heard. Pulmonary/Chest: Effort normal and breath sounds normal. No stridor. She has no wheezes. She has no rales.  Abdominal: Soft. Bowel sounds are normal. She exhibits no distension. There is no abdominal tenderness.  Musculoskeletal:        General: No edema.     Cervical back: Neck supple.  Neurological: She is alert and oriented to person, place, and time. She has intact cranial nerves (2-12).  Skin: Skin is warm and moist.     Impression & Recommendation(s):  Impression:   ICD-10-CM   1. Pre-op evaluation  Z01.818 EKG 12-Lead    ECHOCARDIOGRAM COMPLETE    MYOCARDIAL PERFUSION IMAGING    Cardiac Stress Test: Informed Consent Details: Physician/Practitioner Attestation; Transcribe to consent form and obtain patient signature    2. Shortness of breath  R06.02 ECHOCARDIOGRAM COMPLETE    MYOCARDIAL PERFUSION IMAGING    3. Benign hypertension with CKD (chronic kidney disease) stage III (HCC)  I12.9    N18.30     4. Mixed hyperlipidemia  E78.2 LDL cholesterol, direct    Comprehensive metabolic panel    Lipid panel       Recommendation(s):  Pre-op evaluation Patient is tentatively being considered for surgery with Dr. Hoyt Adkins -patient is unaware of the type of surgery/anesthesia.  Date is to be determined. EKG is nonischemic.  Overall functional  capacity is limited due to back pain.  But the amount of physical activity she does perform at the current moment causes her to be short of breath which is much more noticeable now than before.  She carries a history of diastolic heart failure but I do not have a recent echocardiogram to confirm or refute the diagnoses and she is currently not on GDMT.   Given her symptoms, risk factors, and upcoming elective surgery shared decision was to proceed with echo and stress test. EKG is interpretable but patient is unable to exercise and therefore pharmacological stress is requested. Will also reach out to Dr. Maurine Minister office with regards to the type of surgery being considered.  And patient is aware that if the surgery is emergent/life-saving/limb saving she can proceed forward.  Otherwise we will provide preoperative risk assessment after the testing is complete.  Patient is agreeable with the plan of care  Shortness of breath Chronic. More noticeable in the recent past. Denies heart failure symptoms. Has known asthma which may be contributory as well. Plan echo and stress test as discussed above  Benign hypertension with CKD (chronic kidney disease) stage III (HCC) Office blood pressures are acceptable but not at goal. Medications reconciled. Currently managed by primary care provider.  Mixed hyperlipidemia Currently not on statin or lipid-lowering agents Last lipid profile from February 2024 independently reviewed-LDL calculated 130 mg/dL. On physical examination patient has bilateral arcus senilis. She is a very functional member of society and would benefit from lipid-lowering agents. Patient is agreeable with starting statin therapy.  Start rosuvastatin 20 mg p.o. nightly. Fasting lipids in 6 weeks  Orders Placed:  Orders Placed This Encounter  Procedures   LDL cholesterol, direct    Standing Status:   Future    Standing Expiration Date:   06/04/2024   Comprehensive metabolic panel     Standing Status:   Future    Standing Expiration Date:   06/04/2024    Order Specific Question:   Has the patient fasted?    Answer:   Yes   Lipid panel    Standing Status:   Future    Standing Expiration Date:   06/04/2024    Order Specific Question:   Has the patient fasted?    Answer:   Yes   Cardiac Stress Test: Informed Consent Details: Physician/Practitioner Attestation; Transcribe to consent form and obtain patient signature    Order Specific Question:   Physician/Practitioner attestation of informed consent for procedure/surgical case    Answer:   I, the physician/practitioner, attest that I have discussed with the patient the benefits, risks, side effects, alternatives, likelihood of achieving goals and potential problems during recovery for the procedure that I have  provided informed consent.    Order Specific Question:   Procedure    Answer:   Eugenie Birks Stress Test    Order Specific Question:   Indication/Reason    Answer:   Pre-operative evaluation, dyspnea   MYOCARDIAL PERFUSION IMAGING    Standing Status:   Future    Standing Expiration Date:   06/04/2024    Order Specific Question:   Patient weight in lbs    Answer:   150    Order Specific Question:   Where should this be performed?    Answer:   [facility]    Order Specific Question:   Type of stress    Answer:   Lexiscan   EKG 12-Lead   ECHOCARDIOGRAM COMPLETE    Standing Status:   Future    Standing Expiration Date:   06/04/2024    Order Specific Question:   Where should this test be performed    Answer:   [facility]    Order Specific Question:   Does the patient weigh less than or greater than 250 lbs?    Answer:   Patient weighs less than 250 lbs    Order Specific Question:   Perflutren DEFINITY (image enhancing agent) should be administered unless hypersensitivity or allergy exist    Answer:   Administer Perflutren    Order Specific Question:   Reason for  exam-Echo    Answer:   Preoperative evaluation    Order Specific Question:   Reason for exam-Echo    Answer:   Dyspnea  R06.00    As part of medical decision making results of the referring providers documentation, outside labs from February/July 2024, EKG were reviewed independently at today's visit.   Final Medication List:    Meds ordered this encounter  Medications   rosuvastatin (CRESTOR) 20 MG tablet    Sig: Take 1 tablet (20 mg total) by mouth daily.    Dispense:  90 tablet    Refill:  3    Medications Discontinued During This Encounter  Medication Reason   methocarbamol (ROBAXIN) 500 MG tablet Patient Preference   promethazine-dextromethorphan (PROMETHAZINE-DM) 6.25-15 MG/5ML syrup Patient Preference   terconazole (TERAZOL 7) 0.4 % vaginal cream Patient Preference   traMADol (ULTRAM) 50 MG tablet Patient Preference     Current Outpatient Medications:    acetaminophen (TYLENOL 8 HOUR) 650 MG CR tablet, Take 650 mg by mouth every 8 (eight) hours., Disp: , Rfl:    albuterol (PROVENTIL HFA;VENTOLIN HFA) 108 (90 BASE) MCG/ACT inhaler, Inhale 2-4 puffs into the lungs every 4 (four) hours as needed for wheezing or shortness of breath., Disp: 1 Inhaler, Rfl: 0   albuterol (PROVENTIL) (2.5 MG/3ML) 0.083% nebulizer solution, Take 3 mLs (2.5 mg total) by nebulization every 4 (four) hours as needed for wheezing or shortness of breath., Disp: 90 mL, Rfl: 2   amLODipine (NORVASC) 5 MG tablet, Take 5 mg by mouth daily., Disp: , Rfl:    Cholecalciferol ([employer]S VIT D 5000 HIGH-POTENCY PO), Take 5,000 Units by mouth daily., Disp: , Rfl:    citalopram (CELEXA) 40 MG tablet, Take 40 mg by mouth daily., Disp: , Rfl:    diazepam (VALIUM) 5 MG tablet, Take 2.5-5 mg by mouth daily as needed., Disp: , Rfl:    Fluticasone-Umeclidin-Vilant (TRELEGY ELLIPTA) 100-62.5-25 MCG/ACT AEPB, Inhale 1 puff into the lungs daily., Disp: 60 each, Rfl: 11   gabapentin (NEURONTIN) 100 MG capsule, Take 100 mg by mouth  at bedtime., Disp: , Rfl:    hydrochlorothiazide (HYDRODIURIL) 25 MG tablet, Take 12.5 mg by mouth daily., Disp: , Rfl:    loratadine (CLARITIN) 10 MG tablet, Take 10 mg by mouth daily., Disp: , Rfl:    Nebivolol HCl 20 MG TABS, Take 20 mg by mouth every morning., Disp: , Rfl:    pantoprazole (PROTONIX) 40 MG tablet, Take 40 mg by mouth daily., Disp: , Rfl:    polyvinyl alcohol (LIQUIFILM TEARS) 1.4 % ophthalmic solution, Place 1 drop into both eyes daily as needed for dry eyes., Disp: , Rfl:    potassium chloride (KLOR-CON) 10 MEQ tablet, Take 10 mEq by mouth daily., Disp: , Rfl:    rosuvastatin (CRESTOR) 20 MG tablet, Take 1 tablet (20 mg total) by mouth daily., Disp: 90 tablet, Rfl: 3   vitamin C (ASCORBIC ACID) 500 MG tablet, Take 500 mg by mouth daily., Disp: , Rfl:   Consent:   Informed Consent   Shared Decision Making/Informed Consent The risks [chest pain, shortness of breath, cardiac arrhythmias, dizziness, blood pressure fluctuations, myocardial infarction, stroke/transient ischemic attack, nausea, vomiting, allergic reaction, radiation exposure, metallic taste sensation and life-threatening complications (estimated to be 1 in 10,000)], benefits (risk stratification, diagnosing coronary artery disease, treatment guidance) and alternatives of a nuclear stress test were discussed in detail with Ms. Pecina and she agrees to proceed.     Disposition:   Follow-up in 6 months sooner if needed-reevaluation of dyspnea and review test results Patient may be asked to follow-up sooner based on the results of the above-mentioned testing.  Her questions and concerns were addressed to her satisfaction. She voices understanding of the recommendations provided during this encounter.    Signed, Renee Lerner, DO, [facility]  [facility]  [address] [place], [place] [address] 06/05/2023 12:59 PM

## 2023-06-05 ENCOUNTER — Ambulatory Visit: Payer: Medicare Other | Attending: Cardiology | Admitting: Cardiology

## 2023-06-05 ENCOUNTER — Encounter: Payer: Self-pay | Admitting: Cardiology

## 2023-06-05 ENCOUNTER — Telehealth: Payer: Self-pay

## 2023-06-05 VITALS — BP 140/62 | HR 66 | Resp 17 | Ht 61.0 in | Wt 150.0 lb

## 2023-06-05 DIAGNOSIS — R0602 Shortness of breath: Secondary | ICD-10-CM

## 2023-06-05 DIAGNOSIS — Z01818 Encounter for other preprocedural examination: Secondary | ICD-10-CM

## 2023-06-05 DIAGNOSIS — E782 Mixed hyperlipidemia: Secondary | ICD-10-CM | POA: Diagnosis not present

## 2023-06-05 DIAGNOSIS — I129 Hypertensive chronic kidney disease with stage 1 through stage 4 chronic kidney disease, or unspecified chronic kidney disease: Secondary | ICD-10-CM | POA: Diagnosis not present

## 2023-06-05 DIAGNOSIS — N183 Chronic kidney disease, stage 3 unspecified: Secondary | ICD-10-CM

## 2023-06-05 MED ORDER — ROSUVASTATIN CALCIUM 20 MG PO TABS: 20.0000 mg | ORAL_TABLET | Freq: Every day | ORAL | 3 refills | Status: DC

## 2023-06-05 NOTE — Telephone Encounter (Signed)
Yes.  She did not know what she was having done so we had to reach out the office.  Also she has pending tests that need to be done.   Lynetta Tomczak Eolia, DO, [facility]

## 2023-06-05 NOTE — Patient Instructions (Addendum)
Medication Instructions:  Your physician has recommended you make the following change in your medication:   START Crestor 20 mg once daily   *If you need a refill on your cardiac medications before your next appointment, please call your pharmacy*  Lab Work: FASTING lipid panel, direct LDL, and CMP in 6 weeks  If you have labs (blood work) drawn today and your tests are completely normal, you will receive your results only by: MyChart Message (if you have MyChart) OR A paper copy in the mail If you have any lab test that is abnormal or we need to change your treatment, we will call you to review the results.  Testing/Procedures: Your physician has requested that you have an echocardiogram. Echocardiography is a painless test that uses sound waves to create images of your heart. It provides your doctor with information about the size and shape of your heart and how well your heart's chambers and valves are working. This procedure takes approximately one hour. There are no restrictions for this procedure. Please do NOT wear cologne, perfume, aftershave, or lotions (deodorant is allowed). Please arrive 15 minutes prior to your appointment time.  Your physician has requested that you have a lexiscan myoview. For further information please visit [email or web address] Please follow instruction sheet, as given..  Follow-Up: At [facility], you and your health needs are our priority.  As part of our continuing mission to provide you with exceptional heart care, we have created designated Provider Care Teams.  These Care Teams include your primary Cardiologist (physician) and Advanced Practice Providers (APPs -  Physician Assistants and Nurse Practitioners) who all work together to provide you with the care you need, when you need it.  Your next appointment:   6 month(s)  The format for your next appointment:   In Person  Provider:   Tessa Lerner, DO {  Other Instructions Lexiscan Myoview  (Stress Test) Instructions  Please arrive 15 minutes prior to your appointment time for registration and insurance purposes.   The test will take approximately 3 to 4 hours to complete; you may bring reading material.  If someone comes with you to your appointment, they will need to remain in the main lobby due to limited space in the testing area.   How to prepare for your Myocardial Perfusion Test: Do not eat or drink 3 hours prior to your test, except you may have water. Do not consume products containing caffeine (regular or decaffeinated) 12 hours prior to your test. (ex: coffee, chocolate, sodas, tea). Do bring a list of your current medications with you. You may take your medications as normal. Do wear comfortable clothes (no dresses or overalls) and walking shoes, tennis shoes preferred (No heels or open toe shoes are allowed). Do NOT wear cologne, perfume, aftershave, or lotions (deodorant is allowed). If these instructions are not followed, your test will have to be rescheduled.   Please report to [address] for your test.  If you have questions or concerns about your appointment, you can call the Nuclear Lab at [phone].   If you cannot keep your appointment, please provide 24 hours notification to the Nuclear Lab, to avoid a possible $50 charge to your account.

## 2023-06-05 NOTE — Telephone Encounter (Signed)
Pre-operative Risk Assessment    Patient Name: Renee Adkins  DOB: [birth year] MRN: [id number]=     Request for Surgical Clearance    Procedure:  Lumbar Laminectomy  Date of Surgery:  Clearance TBD                                 Surgeon:  Dr. Hoyt Koch Surgeon's Group or Practice Name:  [facility]  Phone number:  [phone] Fax number:  [phone]   Type of Clearance Requested:   - Medical    Type of Anesthesia:  General    Additional requests/questions:    Scarlette Shorts   06/05/2023, 12:35 PM

## 2023-06-05 NOTE — Telephone Encounter (Signed)
Per Dr. Odis Hollingshead pt will need to complete cardiac testing for preop clearance.

## 2023-06-10 ENCOUNTER — Telehealth (HOSPITAL_COMMUNITY): Payer: Self-pay | Admitting: Radiology

## 2023-06-10 NOTE — Telephone Encounter (Signed)
Patient given detailed instructions per Myocardial Perfusion Study Information Sheet for the test on 11/4 at 7;15. Patient notified to arrive 15 minutes early and that it is imperative to arrive on time for appointment to keep from having the test rescheduled.  If you need to cancel or reschedule your appointment, please call the office within 24 hours of your appointment. . Patient verbalized understanding.EHK

## 2023-06-16 ENCOUNTER — Ambulatory Visit (HOSPITAL_COMMUNITY): Payer: Medicare Other | Attending: Cardiology

## 2023-06-16 ENCOUNTER — Ambulatory Visit (HOSPITAL_BASED_OUTPATIENT_CLINIC_OR_DEPARTMENT_OTHER): Payer: Medicare Other

## 2023-06-16 DIAGNOSIS — Z01818 Encounter for other preprocedural examination: Secondary | ICD-10-CM

## 2023-06-16 DIAGNOSIS — R0602 Shortness of breath: Secondary | ICD-10-CM

## 2023-06-16 DIAGNOSIS — Z01810 Encounter for preprocedural cardiovascular examination: Secondary | ICD-10-CM

## 2023-06-16 LAB — ECHOCARDIOGRAM COMPLETE
Area-P 1/2: 3.6 cm2
P 1/2 time: 1300 ms
S' Lateral: 2.3 cm

## 2023-06-16 LAB — MYOCARDIAL PERFUSION IMAGING
LV dias vol: 56 mL (ref 46–106)
LV sys vol: 13 mL
Nuc Stress EF: 77 %
Peak HR: 83 {beats}/min
Rest HR: 62 {beats}/min
Rest Nuclear Isotope Dose: 8.6 mCi
SDS: 1
SRS: 0
SSS: 1
ST Depression (mm): 0 mm
Stress Nuclear Isotope Dose: 30.4 mCi
TID: 0.92

## 2023-06-16 MED ORDER — REGADENOSON 0.4 MG/5ML IV SOLN
0.4000 mg | Freq: Once | INTRAVENOUS | Status: AC
  Administered 2023-06-16: 0.4 mg via INTRAVENOUS

## 2023-06-16 MED ORDER — TECHNETIUM TC 99M TETROFOSMIN IV KIT
8.6000 | PACK | Freq: Once | INTRAVENOUS | Status: AC | PRN
  Administered 2023-06-16: 8.6 via INTRAVENOUS

## 2023-06-16 MED ORDER — TECHNETIUM TC 99M TETROFOSMIN IV KIT
30.4000 | PACK | Freq: Once | INTRAVENOUS | Status: AC | PRN
Start: 2023-06-16 — End: 2023-06-16
  Administered 2023-06-16: 30.4 via INTRAVENOUS

## 2023-06-17 ENCOUNTER — Encounter (HOSPITAL_COMMUNITY): Payer: Medicare Other

## 2023-06-18 NOTE — Telephone Encounter (Signed)
   Patient Name: Renee Adkins  DOB: 1945/12/23 MRN: [id number]  Primary Cardiologist: Tessa Lerner, DO  Chart reviewed as part of pre-operative protocol coverage.   Good morning Dr.Tolia,  Patient has completed Lexiscan Myoview as well as echocardiogram.  Based on her pre procedure testing results would she be at acceptable risk to proceed with scheduled procedure at this time?  Thanks,   Napoleon Form, Leodis Rains, NP 06/18/2023, 10:49 AM

## 2023-06-20 NOTE — Telephone Encounter (Signed)
Yes-based on echo and stress test results she is acceptable risk for upcoming noncardiac surgery. Thank you for sending the preop letter to Dr. Maurine Minister office  Tessa Lerner, DO, [facility]

## 2023-06-20 NOTE — Telephone Encounter (Signed)
   Patient Name: Renee Adkins  DOB: Feb 22, 1946 MRN: [id number]  Primary Cardiologist: Tessa Lerner, DO  Chart reviewed as part of pre-operative protocol coverage. Given past medical history and time since last visit, based on ACC/AHA guidelines, Renee Adkins is at acceptable risk for the planned procedure without further cardiovascular testing. Per Dr. Odis Hollingshead based on most recent echo and stress test results she is at acceptable risk for her procedure.  The patient was advised that if she develops new symptoms prior to surgery to contact our office to arrange for a follow-up visit, and she verbalized understanding.  I will route this recommendation to the requesting party via Epic fax function and remove from pre-op pool.  Please call with questions.  Napoleon Form, Leodis Rains, NP 06/20/2023, 12:57 PM

## 2023-06-23 ENCOUNTER — Telehealth: Payer: Self-pay | Admitting: Cardiology

## 2023-06-23 NOTE — Telephone Encounter (Signed)
Follow Up:       Patient is calling for Echo and Stress test results.

## 2023-06-23 NOTE — Telephone Encounter (Signed)
Left voicemail to return call to office.

## 2023-10-23 ENCOUNTER — Ambulatory Visit: Payer: Medicare Other | Admitting: Pulmonary Disease

## 2023-11-20 ENCOUNTER — Encounter: Payer: Self-pay | Admitting: Pulmonary Disease

## 2023-11-20 ENCOUNTER — Ambulatory Visit: Payer: Medicare Other | Admitting: Pulmonary Disease

## 2023-11-20 VITALS — BP 124/68 | HR 85 | Ht 61.0 in | Wt 140.0 lb

## 2023-11-20 DIAGNOSIS — J4540 Moderate persistent asthma, uncomplicated: Secondary | ICD-10-CM

## 2023-11-20 DIAGNOSIS — J432 Centrilobular emphysema: Secondary | ICD-10-CM

## 2023-11-20 MED ORDER — ALBUTEROL SULFATE (2.5 MG/3ML) 0.083% IN NEBU: 2.5000 mg | INHALATION_SOLUTION | RESPIRATORY_TRACT | 2 refills | Status: AC | PRN

## 2023-11-20 MED ORDER — ALBUTEROL SULFATE HFA 108 (90 BASE) MCG/ACT IN AERS: 2.0000 | INHALATION_SPRAY | RESPIRATORY_TRACT | 11 refills | Status: DC | PRN

## 2023-11-20 MED ORDER — TRELEGY ELLIPTA 100-62.5-25 MCG/ACT IN AEPB: 1.0000 | INHALATION_SPRAY | Freq: Every day | RESPIRATORY_TRACT | 12 refills | Status: AC

## 2023-11-20 MED ORDER — ALBUTEROL SULFATE HFA 108 (90 BASE) MCG/ACT IN AERS: 2.0000 | INHALATION_SPRAY | RESPIRATORY_TRACT | 11 refills | Status: AC | PRN

## 2023-11-20 NOTE — Addendum Note (Signed)
 Addended byVilma Meckel on: 11/20/2023 10:40 AM   Modules accepted: Orders

## 2023-11-20 NOTE — Patient Instructions (Signed)
 I am glad you are doing well  No changes in medication  Trelegy, albuterol inhaler, albuterol nebulizer solution all refilled today  Return to clinic in 1 year or sooner as needed with Dr. Judeth Horn

## 2023-11-20 NOTE — Progress Notes (Signed)
 @Patient  ID: Renee Adkins, female    DOB: May 18, 1946, 78 y.o.   MRN: [id number]  Chief Complaint  Patient presents with   Follow-up    Referring provider: Aliene Beams, MD  HPI:   78 y.o. whom we are seeing in follow-up for evaluation of  dyspnea on exertion felt largely due to asthma as well as emphysema seen on CT scan.  Most recent cardiology note reviewed.  Patient returns for follow-up.  Maintained on low-dose Trelegy.  Doing quite well.  The extra bronchodilators really helped her.  No exacerbations in the interim.  Breathing doing well.  Atopic symptoms with watery eyes runny nose scratchy voice but breathing has remained stable without wheezing etc.  She is quite pleased by this.  Had spinal surgery in the interim, this went well.  HPI at initial visit Patient has been diagnosed with pneumonia 02/2021 based on chest x-ray.  At that time she had fever, productive cough, mild dyspnea on exertion.  She recalls being sick to her stomach, vomiting in hours to days preceding symptoms.  Seen by her PCP.  Chest x-ray obtained 02/27/2021 on my review interpretation reveals right basilar left lower hazy infiltrate.  She received antibiotics.  Symptoms improved with still had some ongoing shortness of breath.  Repeat chest x-ray 03/15/2021 reviewed and on my interpretation showed improved bibasilar infiltrates with persistent linear infiltrate favored to represent atelectasis versus evolving or improving pneumonia.  She has dyspnea on exertion.  Present with inclines or stairs.  No time of day when things are better or worse.  No positional things are better or worse.  No seasonal environmental factors that she can identify to make things better or worse.  She uses her albuterol as needed with improvement.  Using about 3 times a week, sometimes more.  She continues on budesonide nebs twice daily.  No other alleviating or exacerbating factors.  PMH: Asthma, GERD, hypertension Surgical history:  Knee replacement, vesicovaginal fistula closure Family history: CAD and father, CAD in brother, lung cancer in brother Social history: Never smoker, lives in [place]   Questionaires / Pulmonary Flowsheets:   ACT:  Asthma Control Test ACT Total Score  11/20/2023 10:18 AM 25  05/07/2023  9:18 AM 18  04/23/2021  4:15 PM 8    MMRC:     No data to display          Epworth:      No data to display          Tests:   FENO:  No results found for: "NITRICOXIDE"  PFT:    Latest Ref Rng & Units 05/02/2021    3:41 PM  PFT Results  FVC-Pre L 1.91   FVC-Predicted Pre % 104   FVC-Post L 2.08   FVC-Predicted Post % 112   Pre FEV1/FVC % % 68   Post FEV1/FCV % % 70   FEV1-Pre L 1.31   FEV1-Predicted Pre % 92   FEV1-Post L 1.45   DLCO uncorrected ml/min/mmHg 9.62   DLCO UNC% % 56   DLCO corrected ml/min/mmHg 9.62   DLCO COR %Predicted % 56   DLVA Predicted % 70   TLC L 5.58   TLC % Predicted % 121   RV % Predicted % 168   Personally reviewed and interpreted as spirometry normal, no significant bronchodilator spots, lung volumes consistent with hyperinflation and air trapping, DLCO moderately reduced.  WALK:      No data to display  Imaging: Personally reviewed and as per EMR discussion this note No results found.  Lab Results: Personally reviewed, notably eosinophils elevated in the past CBC    Component Value Date/Time   WBC 8.4 02/19/2023 0809   RBC 4.51 02/19/2023 0809   HGB 13.8 02/19/2023 0809   HCT 42.1 02/19/2023 0809   PLT 214 02/19/2023 0809   MCV 93.3 02/19/2023 0809   MCH 30.6 02/19/2023 0809   MCHC 32.8 02/19/2023 0809   RDW 13.3 02/19/2023 0809   LYMPHSABS 3.3 02/19/2023 0809   MONOABS 0.6 02/19/2023 0809   EOSABS 0.2 02/19/2023 0809   BASOSABS 0.1 02/19/2023 0809    BMET    Component Value Date/Time   NA 137 02/20/2023 0756   K 3.6 02/20/2023 0756   CL 103 02/20/2023 0756   CO2 24 02/20/2023 0756   GLUCOSE 136 (H)  02/20/2023 0756   BUN 18 02/20/2023 0756   CREATININE 1.26 (H) 02/20/2023 0756   CALCIUM 9.0 02/20/2023 0756   GFRNONAA 44 (L) 02/20/2023 0756   GFRAA >60 04/21/2016 0901    BNP    Component Value Date/Time   BNP 38.1 04/20/2016 0850    ProBNP    Component Value Date/Time   PROBNP 59.4 11/10/2013 1632    Specialty Problems       Pulmonary Problems   Asthma   Followed in Pulmonary clinic/ [facility]/ [place]     - PFT's 03/17/09  FEV1  1.55 (71%) ratio 65 and no better p B2,  DlCO 44 > corrects to 81%    Replacing diagnoses that were inactivated after the 11/11/22 regulatory import       COUGH   Qualifier: Diagnosis of  By: Sherene Sires MD, Charlaine Dalton       Centrilobular emphysema (HCC)    Allergies  Allergen Reactions   Azithromycin Shortness Of Breath and Swelling   Codeine Nausea And Vomiting and Other (See Comments)    REACTION: tachycardia   Levofloxacin Shortness Of Breath   Meloxicam Shortness Of Breath   Penicillins Anaphylaxis   Naproxen Nausea And Vomiting    Immunization History  Administered Date(s) Administered   PFIZER(Purple Top)SARS-COV-2 Vaccination 09/16/2019, 10/07/2019   Tdap 05/17/2014    Past Medical History:  Diagnosis Date   Anxiety 01/25/2012   Asthma    Carpal tunnel syndrome    Chronic cystitis    Chronic diastolic congestive heart failure (HCC) 01/29/2012   CYSTITIS    Edema    Elevated WBC count    Hematochezia    History of colon polyps    Hypertension    Hypertensive kidney disease    Insomnia    Kidney disease, chronic, stage III (moderate, EGFR 30-59 ml/min) (HCC)    Low back pain    Lower extremity edema    Muscle tension headache    Osteopenia    Paresthesia 01/27/2012   Pericarditis    Seizures (HCC)     Tobacco History: Social History   Tobacco Use  Smoking Status Never  Smokeless Tobacco Never   Counseling given: Not Answered   Continue to not smoke  Outpatient Encounter Medications as of  11/20/2023  Medication Sig   acetaminophen (TYLENOL 8 HOUR) 650 MG CR tablet Take 650 mg by mouth every 8 (eight) hours.   amLODipine (NORVASC) 5 MG tablet Take 5 mg by mouth daily.   Cholecalciferol ([employer] VIT D 5000 HIGH-POTENCY PO) Take 5,000 Units by mouth daily.   citalopram (CELEXA) 40 MG tablet Take 40  mg by mouth daily.   gabapentin (NEURONTIN) 100 MG capsule Take 100 mg by mouth at bedtime.   hydrochlorothiazide (HYDRODIURIL) 25 MG tablet Take 12.5 mg by mouth daily.   loratadine (CLARITIN) 10 MG tablet Take 10 mg by mouth daily.   Nebivolol HCl 20 MG TABS Take 20 mg by mouth every morning.   pantoprazole (PROTONIX) 40 MG tablet Take 40 mg by mouth daily.   polyvinyl alcohol (LIQUIFILM TEARS) 1.4 % ophthalmic solution Place 1 drop into both eyes daily as needed for dry eyes.   potassium chloride (KLOR-CON) 10 MEQ tablet Take 10 mEq by mouth daily.   rosuvastatin (CRESTOR) 20 MG tablet Take 1 tablet (20 mg total) by mouth daily.   vitamin C (ASCORBIC ACID) 500 MG tablet Take 500 mg by mouth daily.   [DISCONTINUED] albuterol (PROVENTIL HFA;VENTOLIN HFA) 108 (90 BASE) MCG/ACT inhaler Inhale 2-4 puffs into the lungs every 4 (four) hours as needed for wheezing or shortness of breath.   [DISCONTINUED] albuterol (PROVENTIL) (2.5 MG/3ML) 0.083% nebulizer solution Take 3 mLs (2.5 mg total) by nebulization every 4 (four) hours as needed for wheezing or shortness of breath.   [DISCONTINUED] Fluticasone-Umeclidin-Vilant (TRELEGY ELLIPTA) 100-62.5-25 MCG/ACT AEPB Inhale 1 puff into the lungs daily.   albuterol (PROVENTIL) (2.5 MG/3ML) 0.083% nebulizer solution Take 3 mLs (2.5 mg total) by nebulization every 4 (four) hours as needed for wheezing or shortness of breath.   albuterol (VENTOLIN HFA) 108 (90 Base) MCG/ACT inhaler Inhale 2 puffs into the lungs every 4 (four) hours as needed for wheezing or shortness of breath.   Fluticasone-Umeclidin-Vilant (TRELEGY ELLIPTA) 100-62.5-25 MCG/ACT AEPB Inhale 1  puff into the lungs daily.   [DISCONTINUED] diazepam (VALIUM) 5 MG tablet Take 2.5-5 mg by mouth daily as needed.   No facility-administered encounter medications on file as of 11/20/2023.     Review of Systems N/a  Physical Exam  BP 124/68 (BP Location: Left Arm, Patient Position: Sitting, Cuff Size: Normal)   Pulse 85   Ht 5\' 1"  (1.549 m)   Wt 140 lb (63.5 kg)   SpO2 97%   BMI 26.45 kg/m   Wt Readings from Last 5 Encounters:  11/20/23 140 lb (63.5 kg)  06/16/23 150 lb (68 kg)  06/05/23 150 lb (68 kg)  05/07/23 156 lb 12.8 oz (71.1 kg)  10/17/22 168 lb 12.8 oz (76.6 kg)    BMI Readings from Last 5 Encounters:  11/20/23 26.45 kg/m  06/16/23 28.34 kg/m  06/05/23 28.34 kg/m  05/07/23 29.63 kg/m  10/17/22 31.89 kg/m     Physical Exam General: Sitting in chair, in no acute distress Eyes: EOMI, icterus Neck: Supple, no JVP Pulmonary: Clear, normal work of breathing Cardiovascular: Regular rate and rhythm, no edema, warm Abdomen: Soft, bowel sounds present MSK: No synovitis, no joint effusion Neuro: Normal gait, no weakness Psych: Normal mood, full affect   Assessment & Plan:    Dyspnea on exertion: Suspect multifactorial related to deconditioning, possible poorly controlled asthma versus emphysema.  PFTs reveal hyperinflation and air trapping consistent with small airways disease, emphysema on imaging.  No fixed obstruction.  Advair with mild to moderate improvement in symptoms.  Given hyperinflation, additional bronchodilator added, escalated to Trelegy mid dose 1 puff daily spring 2024.  Has helped immensely in terms of symptoms.  Asthma: Clinical diagnosis given some environmental factors, atopic symptoms, hyperinflation with lack of fixed obstruction on PFTs.  Warranting triple inhaled therapy with severity of symptoms as well as recurrent exacerbations, ED visits  early 2024.  Continue Trelegy, refilled today.  Rare albuterol use, rescue inhaler and nebulizer  solution refilled today to have on hand if needed.   Return in about 1 year (around 11/19/2024) for f/u Dr. Judeth Horn.   Karren Burly, MD 11/20/2023

## 2024-04-30 DIAGNOSIS — Z23 Encounter for immunization: Secondary | ICD-10-CM | POA: Diagnosis not present

## 2024-04-30 DIAGNOSIS — I10 Essential (primary) hypertension: Secondary | ICD-10-CM | POA: Diagnosis not present

## 2024-04-30 DIAGNOSIS — K219 Gastro-esophageal reflux disease without esophagitis: Secondary | ICD-10-CM | POA: Diagnosis not present

## 2024-04-30 DIAGNOSIS — K5909 Other constipation: Secondary | ICD-10-CM | POA: Diagnosis not present

## 2024-04-30 DIAGNOSIS — M5136 Other intervertebral disc degeneration, lumbar region with discogenic back pain only: Secondary | ICD-10-CM | POA: Diagnosis not present

## 2024-05-11 DIAGNOSIS — M48061 Spinal stenosis, lumbar region without neurogenic claudication: Secondary | ICD-10-CM | POA: Diagnosis not present

## 2024-05-11 DIAGNOSIS — M5416 Radiculopathy, lumbar region: Secondary | ICD-10-CM | POA: Diagnosis not present

## 2024-05-28 DIAGNOSIS — M5416 Radiculopathy, lumbar region: Secondary | ICD-10-CM | POA: Diagnosis not present

## 2024-06-08 DIAGNOSIS — H401131 Primary open-angle glaucoma, bilateral, mild stage: Secondary | ICD-10-CM | POA: Diagnosis not present

## 2024-06-08 DIAGNOSIS — H25813 Combined forms of age-related cataract, bilateral: Secondary | ICD-10-CM | POA: Diagnosis not present

## 2024-06-14 ENCOUNTER — Other Ambulatory Visit: Payer: Self-pay | Admitting: Cardiology

## 2024-09-15 ENCOUNTER — Inpatient Hospital Stay (HOSPITAL_COMMUNITY)
Admission: EM | Admit: 2024-09-15 | Source: Ambulatory Visit | Attending: Internal Medicine | Admitting: Internal Medicine

## 2024-09-15 ENCOUNTER — Other Ambulatory Visit: Payer: Self-pay

## 2024-09-15 ENCOUNTER — Encounter (HOSPITAL_COMMUNITY): Payer: Self-pay | Admitting: Internal Medicine

## 2024-09-15 ENCOUNTER — Emergency Department (HOSPITAL_COMMUNITY)

## 2024-09-15 DIAGNOSIS — R0902 Hypoxemia: Secondary | ICD-10-CM

## 2024-09-15 DIAGNOSIS — I10 Essential (primary) hypertension: Secondary | ICD-10-CM | POA: Diagnosis present

## 2024-09-15 DIAGNOSIS — J441 Chronic obstructive pulmonary disease with (acute) exacerbation: Secondary | ICD-10-CM | POA: Diagnosis present

## 2024-09-15 DIAGNOSIS — I5032 Chronic diastolic (congestive) heart failure: Secondary | ICD-10-CM | POA: Diagnosis present

## 2024-09-15 DIAGNOSIS — K219 Gastro-esophageal reflux disease without esophagitis: Secondary | ICD-10-CM | POA: Diagnosis present

## 2024-09-15 DIAGNOSIS — J180 Bronchopneumonia, unspecified organism: Secondary | ICD-10-CM | POA: Diagnosis present

## 2024-09-15 DIAGNOSIS — J189 Pneumonia, unspecified organism: Principal | ICD-10-CM

## 2024-09-15 DIAGNOSIS — N1831 Chronic kidney disease, stage 3a: Secondary | ICD-10-CM | POA: Diagnosis present

## 2024-09-15 DIAGNOSIS — J9601 Acute respiratory failure with hypoxia: Secondary | ICD-10-CM | POA: Diagnosis present

## 2024-09-15 DIAGNOSIS — M5416 Radiculopathy, lumbar region: Secondary | ICD-10-CM | POA: Diagnosis present

## 2024-09-15 DIAGNOSIS — E785 Hyperlipidemia, unspecified: Secondary | ICD-10-CM | POA: Diagnosis present

## 2024-09-15 DIAGNOSIS — R9431 Abnormal electrocardiogram [ECG] [EKG]: Secondary | ICD-10-CM | POA: Diagnosis present

## 2024-09-15 LAB — CBC WITH DIFFERENTIAL/PLATELET
Abs Immature Granulocytes: 0.03 10*3/uL (ref 0.00–0.07)
Basophils Absolute: 0 10*3/uL (ref 0.0–0.1)
Basophils Relative: 0 %
Eosinophils Absolute: 0 10*3/uL (ref 0.0–0.5)
Eosinophils Relative: 1 %
HCT: 41.9 % (ref 36.0–46.0)
Hemoglobin: 13.5 g/dL (ref 12.0–15.0)
Immature Granulocytes: 0 %
Lymphocytes Relative: 17 %
Lymphs Abs: 1.3 10*3/uL (ref 0.7–4.0)
MCH: 30.8 pg (ref 26.0–34.0)
MCHC: 32.2 g/dL (ref 30.0–36.0)
MCV: 95.7 fL (ref 80.0–100.0)
Monocytes Absolute: 0.1 10*3/uL (ref 0.1–1.0)
Monocytes Relative: 2 %
Neutro Abs: 6.1 10*3/uL (ref 1.7–7.7)
Neutrophils Relative %: 80 %
Platelets: 171 10*3/uL (ref 150–400)
RBC: 4.38 MIL/uL (ref 3.87–5.11)
RDW: 13 % (ref 11.5–15.5)
Smear Review: NORMAL
WBC: 7.6 10*3/uL (ref 4.0–10.5)
nRBC: 0 % (ref 0.0–0.2)

## 2024-09-15 LAB — COMPREHENSIVE METABOLIC PANEL WITH GFR
ALT: 19 U/L (ref 0–44)
AST: 31 U/L (ref 15–41)
Albumin: 4.5 g/dL (ref 3.5–5.0)
Alkaline Phosphatase: 61 U/L (ref 38–126)
Anion gap: 13 (ref 5–15)
BUN: 30 mg/dL — ABNORMAL HIGH (ref 8–23)
CO2: 27 mmol/L (ref 22–32)
Calcium: 9.9 mg/dL (ref 8.9–10.3)
Chloride: 104 mmol/L (ref 98–111)
Creatinine, Ser: 1.03 mg/dL — ABNORMAL HIGH (ref 0.44–1.00)
GFR, Estimated: 55 mL/min — ABNORMAL LOW
Glucose, Bld: 122 mg/dL — ABNORMAL HIGH (ref 70–99)
Potassium: 3.7 mmol/L (ref 3.5–5.1)
Sodium: 143 mmol/L (ref 135–145)
Total Bilirubin: 0.5 mg/dL (ref 0.0–1.2)
Total Protein: 7.7 g/dL (ref 6.5–8.1)

## 2024-09-15 LAB — RESP PANEL BY RT-PCR (RSV, FLU A&B, COVID)  RVPGX2
Influenza A by PCR: NEGATIVE
Influenza B by PCR: NEGATIVE
Resp Syncytial Virus by PCR: NEGATIVE
SARS Coronavirus 2 by RT PCR: NEGATIVE

## 2024-09-15 LAB — STREP PNEUMONIAE URINARY ANTIGEN: Strep Pneumo Urinary Antigen: NEGATIVE

## 2024-09-15 MED ORDER — HYDROCHLOROTHIAZIDE 25 MG PO TABS
25.0000 mg | ORAL_TABLET | Freq: Every day | ORAL | Status: AC
  Administered 2024-09-17: 25 mg via ORAL
  Filled 2024-09-15 (×2): qty 1

## 2024-09-15 MED ORDER — POTASSIUM CHLORIDE ER 10 MEQ PO TBCR
10.0000 meq | EXTENDED_RELEASE_TABLET | Freq: Every day | ORAL | Status: AC
  Administered 2024-09-16 – 2024-09-17 (×2): 10 meq via ORAL
  Filled 2024-09-15 (×4): qty 1

## 2024-09-15 MED ORDER — NEBIVOLOL HCL 10 MG PO TABS
20.0000 mg | ORAL_TABLET | Freq: Every day | ORAL | Status: DC
  Filled 2024-09-15 (×2): qty 2

## 2024-09-15 MED ORDER — MELATONIN 3 MG PO TABS
3.0000 mg | ORAL_TABLET | Freq: Every day | ORAL | Status: AC
  Administered 2024-09-15 – 2024-09-17 (×3): 3 mg via ORAL
  Filled 2024-09-15 (×3): qty 1

## 2024-09-15 MED ORDER — IPRATROPIUM-ALBUTEROL 0.5-2.5 (3) MG/3ML IN SOLN
3.0000 mL | Freq: Four times a day (QID) | RESPIRATORY_TRACT | Status: AC
  Administered 2024-09-15 – 2024-09-17 (×8): 3 mL via RESPIRATORY_TRACT
  Filled 2024-09-15 (×7): qty 3

## 2024-09-15 MED ORDER — DOXYCYCLINE HYCLATE 100 MG PO TABS
100.0000 mg | ORAL_TABLET | Freq: Two times a day (BID) | ORAL | Status: AC
  Administered 2024-09-15 – 2024-09-17 (×5): 100 mg via ORAL
  Filled 2024-09-15 (×5): qty 1

## 2024-09-15 MED ORDER — ONDANSETRON HCL 4 MG/2ML IJ SOLN: 4.0000 mg | Freq: Four times a day (QID) | INTRAMUSCULAR | Status: AC | PRN

## 2024-09-15 MED ORDER — LORATADINE 10 MG PO TABS
10.0000 mg | ORAL_TABLET | Freq: Every day | ORAL | Status: AC
  Administered 2024-09-16 – 2024-09-17 (×2): 10 mg via ORAL
  Filled 2024-09-15 (×2): qty 1

## 2024-09-15 MED ORDER — IPRATROPIUM-ALBUTEROL 0.5-2.5 (3) MG/3ML IN SOLN
3.0000 mL | Freq: Once | RESPIRATORY_TRACT | Status: AC
  Administered 2024-09-15: 3 mL via RESPIRATORY_TRACT
  Filled 2024-09-15: qty 3

## 2024-09-15 MED ORDER — CYCLOBENZAPRINE HCL 5 MG PO TABS
5.0000 mg | ORAL_TABLET | Freq: Two times a day (BID) | ORAL | Status: AC | PRN
  Administered 2024-09-15 – 2024-09-17 (×4): 5 mg via ORAL
  Filled 2024-09-15 (×4): qty 1

## 2024-09-15 MED ORDER — PREDNISONE 20 MG PO TABS
40.0000 mg | ORAL_TABLET | Freq: Every day | ORAL | Status: DC
  Administered 2024-09-16: 40 mg via ORAL
  Filled 2024-09-15: qty 2

## 2024-09-15 MED ORDER — DOXYCYCLINE HYCLATE 100 MG PO TABS
100.0000 mg | ORAL_TABLET | Freq: Once | ORAL | Status: AC
  Administered 2024-09-15: 100 mg via ORAL
  Filled 2024-09-15: qty 1

## 2024-09-15 MED ORDER — ALBUTEROL SULFATE (2.5 MG/3ML) 0.083% IN NEBU: 2.5000 mg | INHALATION_SOLUTION | RESPIRATORY_TRACT | Status: AC | PRN

## 2024-09-15 MED ORDER — ONDANSETRON HCL 4 MG PO TABS: 4.0000 mg | ORAL_TABLET | Freq: Four times a day (QID) | ORAL | Status: AC | PRN

## 2024-09-15 MED ORDER — ENOXAPARIN SODIUM 40 MG/0.4ML IJ SOSY
40.0000 mg | PREFILLED_SYRINGE | INTRAMUSCULAR | Status: AC
  Administered 2024-09-15 – 2024-09-17 (×3): 40 mg via SUBCUTANEOUS
  Filled 2024-09-15 (×3): qty 0.4

## 2024-09-15 MED ORDER — CYCLOBENZAPRINE HCL 5 MG PO TABS: 5.0000 mg | ORAL_TABLET | Freq: Once | ORAL | Status: AC

## 2024-09-15 MED ORDER — PANTOPRAZOLE SODIUM 40 MG PO TBEC
40.0000 mg | DELAYED_RELEASE_TABLET | Freq: Every day | ORAL | Status: AC
  Administered 2024-09-16 – 2024-09-17 (×2): 40 mg via ORAL
  Filled 2024-09-15 (×2): qty 1

## 2024-09-15 MED ORDER — MAGNESIUM SULFATE 2 GM/50ML IV SOLN
2.0000 g | Freq: Once | INTRAVENOUS | Status: AC
  Administered 2024-09-15: 2 g via INTRAVENOUS
  Filled 2024-09-15: qty 50

## 2024-09-15 MED ORDER — ACETAMINOPHEN 650 MG RE SUPP: 650.0000 mg | Freq: Four times a day (QID) | RECTAL | Status: AC | PRN

## 2024-09-15 MED ORDER — CITALOPRAM HYDROBROMIDE 20 MG PO TABS
40.0000 mg | ORAL_TABLET | Freq: Every day | ORAL | Status: AC
  Administered 2024-09-16 – 2024-09-17 (×2): 40 mg via ORAL
  Filled 2024-09-15 (×2): qty 2

## 2024-09-15 MED ORDER — SODIUM CHLORIDE 0.9 % IV SOLN
1.0000 g | INTRAVENOUS | Status: AC
  Administered 2024-09-16 – 2024-09-17 (×2): 1 g via INTRAVENOUS
  Filled 2024-09-15 (×2): qty 10

## 2024-09-15 MED ORDER — SODIUM CHLORIDE 0.9 % IV BOLUS (SEPSIS)
500.0000 mL | Freq: Once | INTRAVENOUS | Status: AC
  Administered 2024-09-15: 500 mL via INTRAVENOUS

## 2024-09-15 MED ORDER — POTASSIUM CHLORIDE CRYS ER 20 MEQ PO TBCR
20.0000 meq | EXTENDED_RELEASE_TABLET | Freq: Once | ORAL | Status: AC
  Administered 2024-09-15: 20 meq via ORAL
  Filled 2024-09-15: qty 1

## 2024-09-15 MED ORDER — VITAMIN D3 25 MCG (1000 UNIT) PO TABS
5000.0000 [IU] | ORAL_TABLET | Freq: Every day | ORAL | Status: AC
  Administered 2024-09-16 – 2024-09-17 (×2): 5000 [IU] via ORAL
  Filled 2024-09-15 (×2): qty 5

## 2024-09-15 MED ORDER — ACETAMINOPHEN 325 MG PO TABS
650.0000 mg | ORAL_TABLET | Freq: Four times a day (QID) | ORAL | Status: AC | PRN
  Administered 2024-09-16 – 2024-09-17 (×2): 650 mg via ORAL
  Filled 2024-09-15 (×2): qty 2

## 2024-09-15 MED ORDER — AMLODIPINE BESYLATE 5 MG PO TABS
5.0000 mg | ORAL_TABLET | Freq: Every day | ORAL | Status: AC
  Administered 2024-09-17: 5 mg via ORAL
  Filled 2024-09-15 (×2): qty 1

## 2024-09-15 MED ORDER — ROSUVASTATIN CALCIUM 20 MG PO TABS
20.0000 mg | ORAL_TABLET | Freq: Every day | ORAL | Status: AC
  Administered 2024-09-16 – 2024-09-17 (×2): 20 mg via ORAL
  Filled 2024-09-15 (×2): qty 1

## 2024-09-15 MED ORDER — DOXYCYCLINE HYCLATE 100 MG PO TABS: 100.0000 mg | ORAL_TABLET | Freq: Two times a day (BID) | ORAL | Status: DC

## 2024-09-15 MED ORDER — GABAPENTIN 300 MG PO CAPS
300.0000 mg | ORAL_CAPSULE | Freq: Every day | ORAL | Status: AC
  Administered 2024-09-15 – 2024-09-17 (×3): 300 mg via ORAL
  Filled 2024-09-15 (×3): qty 1

## 2024-09-15 MED ORDER — SODIUM CHLORIDE 0.9 % IV SOLN
1.0000 g | Freq: Once | INTRAVENOUS | Status: AC
  Administered 2024-09-15: 1 g via INTRAVENOUS
  Filled 2024-09-15: qty 10

## 2024-09-15 MED ORDER — GABAPENTIN 100 MG PO CAPS
100.0000 mg | ORAL_CAPSULE | Freq: Every day | ORAL | Status: AC | PRN
  Administered 2024-09-15 – 2024-09-17 (×3): 100 mg via ORAL
  Filled 2024-09-15 (×3): qty 1

## 2024-09-15 NOTE — H&P (Signed)
 " History and Physical    Patient: Renee Adkins FMW:[id number] DOB: September 17, 1945 DOA: 09/15/2024 DOS: the patient was seen and examined on 09/15/2024 PCP: Rolinda Millman, MD  Patient coming from: Home  Chief Complaint:  Chief Complaint  Patient presents with   Shortness of Breath   HPI: Renee Adkins is a 79 y.o. female with medical history significant of anxiety, insomnia, asthma, carpal tunnel syndrome, chronic cystitis, chronic systolic heart failure, leukocytosis, hematochezia, colon polyps, hypertension, hypertensive kidney disease, stage III CKD, bilateral lower extremity edema, tension headaches, osteopenia, peristasis, pericarditis, seizure disorder who was brought to the emergency department via EMS from her PCPs office due to exertional dyspnea associated with hypoxia.  EMS placed the patient on nasal cannula at 2 LPM, gave her a breathing treatment and methylprednisolone  125 mg IVP x 1 dose.  He denied fever, chills, rhinorrhea, sore throat, wheezing or hemoptysis.  No chest pain, palpitations, diaphoresis, PND, orthopnea or pitting edema of the lower extremities.  No abdominal pain, nausea, emesis, diarrhea, constipation, melena or hematochezia.  No flank pain, dysuria, frequency or hematuria.  No polyuria, polydipsia, polyphagia or blurred vision.   Lab work: CBC was normal.  CMP showed a glucose of 122, BUN 30 and creatinine 1.03 mg/dL, electrolytes and hepatic functions were normal.  Imaging: 2 view chest radiograph showing interstitial prominence and indistinctness with bibasilar airspace opacities, findings indicative of an infectious bronchiolitis/bronchopneumonia.  ED course: Initial vital signs were temperature 98.4 F, pulse 70, respiration 20, BP 153/71 and O2 sat 95% on nasal cannula oxygen  at 2 LPM.  The patient received ceftriaxone  1 g IVPB, doxycycline  100 mg p.o. x 1 dose, a DuoNeb and 500 mL of normal saline bolus.   Review of Systems: As mentioned in the history of  present illness. All other systems reviewed and are negative. Past Medical History:  Diagnosis Date   Anxiety 01/25/2012   Asthma    Carpal tunnel syndrome    Chronic cystitis    Chronic diastolic congestive heart failure (HCC) 01/29/2012   CYSTITIS    Edema    Elevated WBC count    Hematochezia    History of colon polyps    Hypertension    Hypertensive kidney disease    Insomnia    Kidney disease, chronic, stage III (moderate, EGFR 30-59 ml/min) (HCC)    Low back pain    Lower extremity edema    Muscle tension headache    Osteopenia    Paresthesia 01/27/2012   Pericarditis    Seizures (HCC)    Past Surgical History:  Procedure Laterality Date   COLONOSCOPY  03/2009   repeat due 5-10 years   DG  BONE DENSITY ([facility] HX)  11/2014   normal   TOTAL KNEE ARTHROPLASTY  Jan 2011   rt knee   VESICOVAGINAL FISTULA CLOSURE W/ TAH     Social History:  reports that she has never smoked. She has never used smokeless tobacco. She reports current alcohol use. She reports that she does not use drugs.  Allergies[1]  Family History  Problem Relation Age of Onset   Heart attack Father 64   Heart attack Brother    Cancer - Lung Brother    Cancer Sister        liver   Asthma Neg Hx    Allergies Neg Hx     Prior to Admission medications  Medication Sig Start Date End Date Taking? Authorizing Provider  albuterol  (PROVENTIL ) (2.5 MG/3ML) 0.083% nebulizer solution Take  3 mLs (2.5 mg total) by nebulization every 4 (four) hours as needed for wheezing or shortness of breath. 11/20/23 11/19/24 Yes Hunsucker, Donnice SAUNDERS, MD  acetaminophen  (TYLENOL  8 HOUR) 650 MG CR tablet Take 650 mg by mouth every 8 (eight) hours.    [provider]  albuterol  (VENTOLIN  HFA) 108 (90 Base) MCG/ACT inhaler Inhale 2 puffs into the lungs every 4 (four) hours as needed for wheezing or shortness of breath. 11/20/23   Hunsucker, Donnice SAUNDERS, MD  amLODipine  (NORVASC ) 5 MG tablet Take 5 mg by mouth daily. 05/29/23    [provider]  citalopram  (CELEXA ) 40 MG tablet Take 40 mg by mouth daily. 11/10/19   [provider]  Fluticasone -Umeclidin-Vilant (TRELEGY ELLIPTA ) 100-62.5-25 MCG/ACT AEPB Inhale 1 puff into the lungs daily. 11/20/23   Hunsucker, Donnice SAUNDERS, MD  gabapentin  (NEURONTIN ) 100 MG capsule Take 100 mg by mouth at bedtime. 05/28/23   [provider]  hydrochlorothiazide  (HYDRODIURIL ) 25 MG tablet Take 12.5 mg by mouth daily.    [provider]  loratadine  (CLARITIN ) 10 MG tablet Take 10 mg by mouth daily.    [provider]  Nebivolol  HCl 20 MG TABS Take 20 mg by mouth every morning.    [provider]  pantoprazole  (PROTONIX ) 40 MG tablet Take 40 mg by mouth daily. 09/15/19   [provider]  potassium chloride  (KLOR-CON ) 10 MEQ tablet Take 10 mEq by mouth daily.    [provider]  rosuvastatin  (CRESTOR ) 20 MG tablet Take 1 tablet (20 mg total) by mouth daily. 06/16/24   Michele Richardson, DO    Physical Exam: Vitals:   09/15/24 1245 09/15/24 1339  BP: (!) 153/71 138/78  Pulse: 70 81  Resp: 20 16  Temp: 98.4 F (36.9 C) 97.9 F (36.6 C)  TempSrc: Oral Oral  SpO2: 95% 97%   Physical Exam Vitals and nursing note reviewed.  Constitutional:      General: She is awake. She is not in acute distress.    Appearance: She is ill-appearing.     Interventions: Nasal cannula in place.  HENT:     Head: Normocephalic.     Nose: No rhinorrhea.     Mouth/Throat:     Mouth: Mucous membranes are moist.  Eyes:     General: No scleral icterus.    Pupils: Pupils are equal, round, and reactive to light.  Neck:     Vascular: No JVD.  Cardiovascular:     Rate and Rhythm: Normal rate and regular rhythm.     Heart sounds: S1 normal and S2 normal.  Pulmonary:     Effort: No accessory muscle usage or respiratory distress.     Breath sounds: Decreased breath sounds, wheezing, rhonchi and rales present.  Abdominal:     General: Bowel sounds  are normal. There is no distension.     Palpations: Abdomen is soft.     Tenderness: There is no abdominal tenderness. There is no right CVA tenderness or left CVA tenderness.  Musculoskeletal:     Cervical back: Neck supple.     Right lower leg: No edema.     Left lower leg: No edema.  Skin:    General: Skin is warm and dry.  Neurological:     General: No focal deficit present.     Mental Status: She is alert and oriented to person, place, and time.  Psychiatric:        Mood and Affect: Mood normal.  Behavior: Behavior normal. Behavior is cooperative.     Data Reviewed:  Results are pending, will review when available. 06/16/2023 transthoracic echocardiogram report.  IMPRESSIONS:   1. Left ventricular ejection fraction, by estimation, is 65 to 70%. The  left ventricle has normal function. The left ventricle has no regional  wall motion abnormalities. Left ventricular diastolic parameters were  normal. The average left ventricular  global longitudinal strain is -19.8 %. The global longitudinal strain is  normal.   2. Right ventricular systolic function is normal. The right ventricular  size is normal. There is moderately elevated pulmonary artery systolic  pressure.   3. MR is directed more posterior into LA. . Mild mitral valve  regurgitation.   4. The aortic valve is tricuspid. Aortic valve regurgitation is mild.   5. The inferior vena cava is normal in size with greater than 50%  respiratory variability, suggesting right atrial pressure of 3 mmHg.   EKG: Vent. rate 80 BPM  PR interval 160 ms  QRS duration 71 ms  QT/QTcB 429/495 ms  P-R-T axes 87 67 64  Sinus rhythm  Borderline prolonged QT interval  Assessment and Plan: Principal Problem:   Acute respiratory failure with hypoxia (HCC) In the setting of:   Bronchopneumonia Complicated by:   COPD with acute exacerbation (HCC)  Admit to telemetry/inpatient. Continue supplemental oxygen . Scheduled and as  needed bronchodilators. Continue ceftriaxone  1 g IVPB daily. Continue doxycycline  100 mg p.o. twice daily. Check strep pneumoniae urinary antigen. Check sputum Gram stain, culture and sensitivity. Follow-up blood culture and sensitivity. Follow-up CBC and chemistry in the morning.  Active Problems:   Essential hypertension Continue Nebivolol  20 mg p.o. daily. Continue amlodipine  5 mg p.o. daily. Continue hydrochlorothiazide  25 mg p.o. daily.    Borderline prolonged QT interval Avoid QT prolonging meds as possible. KCl 20 mill equivalents p.o. x 1 dose. Magnesium  sulfate 2 g IVPB now. Keep electrolytes optimized. Check EKG in the morning.    Chronic diastolic congestive heart failure (HCC) Does not seem to be volume overloaded. Continue Nebivolol  and HCTZ.    Lumbar radiculopathy Analgesics as needed. Cyclobenzaprine  5 mg p.o. twice daily as needed.    CKD stage 3a, GFR 45-59 ml/min (HCC) Monitor renal function and electrolytes.    GERD (gastroesophageal reflux disease) Continue pantoprazole  40 mg p.o. daily.   Advance Care Planning:   Code Status: Full Code   Consults:   Family Communication:   Severity of Illness: The appropriate patient status for this patient is INPATIENT. Inpatient status is judged to be reasonable and necessary in order to provide the required intensity of service to ensure the patient's safety. The patient's presenting symptoms, physical exam findings, and initial radiographic and laboratory data in the context of their chronic comorbidities is felt to place them at high risk for further clinical deterioration. Furthermore, it is not anticipated that the patient will be medically stable for discharge from the hospital within 2 midnights of admission.   * I certify that at the point of admission it is my clinical judgment that the patient will require inpatient hospital care spanning beyond 2 midnights from the point of admission due to high intensity  of service, high risk for further deterioration and high frequency of surveillance required.*  Author: Alm Dorn Castor, MD 09/15/2024 2:55 PM  For on call review [email or web address].   This document was prepared using Dragon voice recognition software and may contain some unintended transcription errors.     [1]  Allergies Allergen  Reactions   Azithromycin Shortness Of Breath and Swelling    Other Reaction(s): itching   Codeine Nausea And Vomiting, Other (See Comments) and Anaphylaxis    REACTION: tachycardia  Other Reaction(s): Not available  codeine   Levofloxacin Shortness Of Breath    Other Reaction(s): vomiting/out of it   Meloxicam Shortness Of Breath   Naproxen Nausea And Vomiting    Other Reaction(s): Not available  naproxen   Penicillins Anaphylaxis   Duloxetine Hcl     Other Reaction(s): disorientation, dizziness   Zolpidem  Tartrate Er     Other Reaction(s): felt aggitated   "

## 2024-09-15 NOTE — ED Notes (Signed)
 Urine sent to lab

## 2024-09-15 NOTE — ED Provider Notes (Signed)
 " [place] EMERGENCY DEPARTMENT AT [facility] Provider Note   CSN: [id number] Arrival date & time: 09/15/24  1237     Patient presents with: Shortness of Breath   Renee Adkins is a 79 y.o. female.  Past Medical History:  Diagnosis Date   Anxiety 01/25/2012   Asthma    Carpal tunnel syndrome    Chronic cystitis    Chronic diastolic congestive heart failure (HCC) 01/29/2012   CYSTITIS    Edema    Elevated WBC count    Hematochezia    History of colon polyps    Hypertension    Hypertensive kidney disease    Insomnia    Kidney disease, chronic, stage III (moderate, EGFR 30-59 ml/min) (HCC)    Low back pain    Lower extremity edema    Muscle tension headache    Osteopenia    Paresthesia 01/27/2012   Pericarditis    Seizures (HCC)      Shortness of Breath 79 year old female past ministry of COPD, asthma presented to the ED for shortness of breath and cough for 1 week.  Patient states she has had a productive cough which she feels has gotten worse.  Also experiencing worsening shortness of breath and wheezing.  Patient used Trelegy and nebulizer at home with some relief.  Patient has had multiple admissions for COPD exacerbations.  Has never been intubated for exacerbations.  Denies any fever, chills, nausea, vomiting, headache, diarrhea, chest pain.     Prior to Admission medications  Medication Sig Start Date End Date Taking? Authorizing Provider  acetaminophen  (TYLENOL  8 HOUR) 650 MG CR tablet Take 650 mg by mouth every 8 (eight) hours.    [provider]  albuterol  (PROVENTIL ) (2.5 MG/3ML) 0.083% nebulizer solution Take 3 mLs (2.5 mg total) by nebulization every 4 (four) hours as needed for wheezing or shortness of breath. 11/20/23 11/19/24  Hunsucker, Donnice SAUNDERS, MD  albuterol  (VENTOLIN  HFA) 108 (90 Base) MCG/ACT inhaler Inhale 2 puffs into the lungs every 4 (four) hours as needed for wheezing or shortness of breath. 11/20/23   Hunsucker,  Donnice SAUNDERS, MD  amLODipine  (NORVASC ) 5 MG tablet Take 5 mg by mouth daily. 05/29/23   [provider]  Cholecalciferol  ([employer]S VIT D 5000 HIGH-POTENCY PO) Take 5,000 Units by mouth daily.    [provider]  citalopram  (CELEXA ) 40 MG tablet Take 40 mg by mouth daily. 11/10/19   [provider]  Fluticasone -Umeclidin-Vilant (TRELEGY ELLIPTA ) 100-62.5-25 MCG/ACT AEPB Inhale 1 puff into the lungs daily. 11/20/23   Hunsucker, Donnice SAUNDERS, MD  gabapentin  (NEURONTIN ) 100 MG capsule Take 100 mg by mouth at bedtime. 05/28/23   [provider]  hydrochlorothiazide  (HYDRODIURIL ) 25 MG tablet Take 12.5 mg by mouth daily.    [provider]  loratadine  (CLARITIN ) 10 MG tablet Take 10 mg by mouth daily.    [provider]  Nebivolol  HCl 20 MG TABS Take 20 mg by mouth every morning.    [provider]  pantoprazole  (PROTONIX ) 40 MG tablet Take 40 mg by mouth daily. 09/15/19   [provider]  polyvinyl alcohol (LIQUIFILM TEARS) 1.4 % ophthalmic solution Place 1 drop into both eyes daily as needed for dry eyes.    [provider]  potassium chloride  (KLOR-CON ) 10 MEQ tablet Take 10 mEq by mouth daily.    [provider]  rosuvastatin  (CRESTOR ) 20 MG tablet Take 1 tablet (20 mg total) by mouth daily. 06/16/24   Michele,  Sunit, DO  vitamin C  (ASCORBIC ACID ) 500 MG tablet Take 500 mg by mouth daily.    [provider]    Allergies: Azithromycin, Codeine, Levofloxacin, Meloxicam, Penicillins, and Naproxen    Review of Systems  Respiratory:  Positive for shortness of breath.     Updated Vital Signs BP 138/78 (BP Location: Right Arm)   Pulse 81   Temp 97.9 F (36.6 C) (Oral)   Resp 16   SpO2 97%   Physical Exam Vitals and nursing note reviewed.  Constitutional:      General: She is not in acute distress.    Appearance: She is well-developed.  HENT:     Head: Normocephalic and atraumatic.  Eyes:      Conjunctiva/sclera: Conjunctivae normal.  Cardiovascular:     Rate and Rhythm: Normal rate and regular rhythm.     Heart sounds: No murmur heard. Pulmonary:     Effort: Pulmonary effort is normal. No respiratory distress.     Breath sounds: Examination of the right-middle field reveals wheezing. Examination of the left-middle field reveals wheezing. Wheezing present.  Abdominal:     Palpations: Abdomen is soft.     Tenderness: There is no abdominal tenderness.  Musculoskeletal:        General: No swelling.     Cervical back: Neck supple.  Skin:    General: Skin is warm and dry.     Capillary Refill: Capillary refill takes less than 2 seconds.  Neurological:     Mental Status: She is alert.  Psychiatric:        Mood and Affect: Mood normal.     (all labs ordered are listed, but only abnormal results are displayed) Labs Reviewed  COMPREHENSIVE METABOLIC PANEL WITH GFR - Abnormal; Notable for the following components:      Result Value   Glucose, Bld 122 (*)    BUN 30 (*)    Creatinine, Ser 1.03 (*)    GFR, Estimated 55 (*)    All other components within normal limits  RESP PANEL BY RT-PCR (RSV, FLU A&B, COVID)  [id number]  CULTURE, BLOOD (ROUTINE X 2)  CULTURE, BLOOD (ROUTINE X 2)  CBC WITH DIFFERENTIAL/PLATELET    EKG: None  Radiology: DG Chest 2 View Result Date: 09/15/2024 CLINICAL DATA:  Cough and hypoxia. EXAM: CHEST - 2 VIEW COMPARISON:  02/19/2023 and CT chest 09/27/2022. FINDINGS: Trachea is midline. Heart size normal. Interstitial prominence and indistinctness with bibasilar airspace opacities. No definite pleural fluid. IMPRESSION: Interstitial prominence and indistinctness with bibasilar airspace opacities, findings indicative of an infectious bronchiolitis/bronchopneumonia. Electronically Signed   By: Newell Eke M.D.   On: 09/15/2024 13:53     .Critical Care  Performed by: Harold Tillman DASEN, PA-C Authorized by: Harold Tillman DASEN, PA-C   Critical care  provider statement:    Critical care time (minutes):  30   Critical care was time spent personally by me on the following activities:  Development of treatment plan with patient or surrogate, discussions with consultants, evaluation of patient's response to treatment, examination of patient, ordering and review of laboratory studies, ordering and review of radiographic studies, ordering and performing treatments and interventions, pulse oximetry, re-evaluation of patient's condition and review of old charts    Medications Ordered in the ED  sodium chloride  0.9 % bolus 500 mL (has no administration in time range)  cefTRIAXone  (ROCEPHIN ) 1 g in sodium chloride  0.9 % 100 mL IVPB (has no administration in time range)  doxycycline  (VIBRA -TABS) tablet  100 mg (has no administration in time range)  ipratropium-albuterol  (DUONEB) 0.5-2.5 (3) MG/3ML nebulizer solution 3 mL (3 mLs Nebulization Given 09/15/24 1301)                                    Medical Decision Making Risk Prescription drug management. Decision regarding hospitalization.   This patient presents to the ED for concern of cough, shortness of breath, this involves an extensive number of treatment options, and is a complaint that carries with it a high risk of complications and morbidity.  The differential diagnosis includes viral illness, pneumonia, COPD exacerbation, asthma exacerbation    Additional history obtained:  Additional history obtained from chart review  Lab Tests:  I Ordered, and personally interpreted labs.  The pertinent results include: No leukocytosis, creatinine/BUN are at baseline   Imaging Studies ordered:  I ordered imaging studies including chest x-ray I independently visualized and interpreted imaging which showed concern for lung bronchiolitis versus bronchopneumonia I agree with the radiologist interpretation   Cardiac Monitoring:  The patient was maintained on a cardiac monitor.  I personally  viewed and interpreted the cardiac monitored which showed an underlying rhythm of: Normal sinus rhythm   Medicines ordered and prescription drug management:  I ordered medication including DuoNeb for shortness of breath, ceftriaxone  and Doxy for pneumonia Reevaluation of the patient after these medicines showed that the patient improved I have reviewed the patients home medicines and have made adjustments as needed    Consultations Obtained:  I requested consultation with the hospitalist,  and discussed lab and imaging findings as well as pertinent plan - they recommend: Admission for pneumonia treatment and new oxygen  requirement   Problem List / ED Course:  79 year old female presented to the ED for cough and exertional shortness of breath.  Patient was found to be hypoxic and placed on 2 L of oxygen .  On 2 L [place] she was satting at 97% and well-appearing.  Treated with Solu-Medrol  and DuoNeb for wheezing and potential COPD exacerbation with good relief.  Imaging shows that patient has a new pneumonia.  Treated with Rocephin  and doxycycline  in the ED.  Patient has a penicillin allergy though per pharmacy she has received Rocephin  in the past.  Given new oxygen  requirement patient to be admitted for further pneumonia treatment.   Reevaluation:  After the interventions noted above, I reevaluated the patient and found that they have :improved    Dispostion:  After consideration of the diagnostic results and the patients response to treatment, I feel that the patent would benefit from admission for treatment of pneumonia.      Final diagnoses:  Community acquired pneumonia, unspecified laterality  Hypoxia    ED Discharge Orders     None          Harold Tillman ONEIDA DEVONNA 09/15/24 2113  "

## 2024-09-15 NOTE — ED Triage Notes (Signed)
 Patyient BIBA coming from PCP office for exertional SOB/hypoxia, patient placed on 2L, 125 solumedrol given at PCP, c/o rattling cough for weeks. 140/710 HR 73 97% on 2L [place]. Patient is alert and oriented x 4. Airway patent, respirations even and unlabored. Skin normal, warm and dry.

## 2024-09-15 NOTE — ED Provider Triage Note (Signed)
 Emergency Medicine Provider Triage Evaluation Note  Renee Adkins , a 79 y.o. female  was evaluated in triage.  Pt complains of SHOB, cough, +productive (yellow) for a few weeks. Went to PCP for regular follow up visit, O2 sat 89% on RA, put on 2L [place] now 97%. Unsure when last neb was, hx of asthma. No fevers, no leg swelling, no CP.   Review of Systems  Positive:  Negative:   Physical Exam  BP (!) 153/71 (BP Location: Right Arm)   Pulse 70   Temp 98.4 F (36.9 C) (Oral)   Resp 20   SpO2 95%  Gen:   Awake, no distress   Resp:  Normal effort, mild wheezing  MSK:   Moves extremities without difficulty  Other:  No lower ext edema  Medical Decision Making  Medically screening exam initiated at 12:48 PM.  Appropriate orders placed.  Renee Adkins was informed that the remainder of the evaluation will be completed by another provider, this initial triage assessment does not replace that evaluation, and the importance of remaining in the ED until their evaluation is complete.     Beverley Leita LABOR, PA-C 09/15/24 1249

## 2024-09-16 LAB — PROCALCITONIN: Procalcitonin: <0.1 ng/mL

## 2024-09-16 LAB — COMPREHENSIVE METABOLIC PANEL WITH GFR
ALT: 16 U/L (ref 0–44)
AST: 26 U/L (ref 15–41)
Albumin: 4 g/dL (ref 3.5–5.0)
Alkaline Phosphatase: 55 U/L (ref 38–126)
Anion gap: 13 (ref 5–15)
BUN: 22 mg/dL (ref 8–23)
CO2: 23 mmol/L (ref 22–32)
Calcium: 9.4 mg/dL (ref 8.9–10.3)
Chloride: 103 mmol/L (ref 98–111)
Creatinine, Ser: 0.92 mg/dL (ref 0.44–1.00)
GFR, Estimated: >60 mL/min
Glucose, Bld: 109 mg/dL — ABNORMAL HIGH (ref 70–99)
Potassium: 3.6 mmol/L (ref 3.5–5.1)
Sodium: 139 mmol/L (ref 135–145)
Total Bilirubin: 0.5 mg/dL (ref 0.0–1.2)
Total Protein: 7.1 g/dL (ref 6.5–8.1)

## 2024-09-16 LAB — CBC
HCT: 37.1 % (ref 36.0–46.0)
Hemoglobin: 12.6 g/dL (ref 12.0–15.0)
MCH: 31.2 pg (ref 26.0–34.0)
MCHC: 34 g/dL (ref 30.0–36.0)
MCV: 91.8 fL (ref 80.0–100.0)
Platelets: 163 10*3/uL (ref 150–400)
RBC: 4.04 MIL/uL (ref 3.87–5.11)
RDW: 13.1 % (ref 11.5–15.5)
WBC: 7.2 10*3/uL (ref 4.0–10.5)
nRBC: 0 % (ref 0.0–0.2)

## 2024-09-16 MED ORDER — METHYLPREDNISOLONE SODIUM SUCC 40 MG IJ SOLR
40.0000 mg | Freq: Two times a day (BID) | INTRAMUSCULAR | Status: DC
  Administered 2024-09-16 – 2024-09-17 (×2): 40 mg via INTRAVENOUS
  Filled 2024-09-16 (×2): qty 1

## 2024-09-16 NOTE — Progress Notes (Signed)
 " PROGRESS NOTE    Renee Adkins  FMW:[id number] DOB: [birth year] DOA: 09/15/2024 PCP: Rolinda Millman, MD  Subjective:  Patient states she does not feel too good this afternoon.  Still having significant cough and bronchospasm.  No chest pain.  Hospital Course: No notes on file   Assessment and Plan:   Principal Problem:   Acute respiratory failure with hypoxia (HCC) In the setting of:   Bronchopneumonia Complicated by:   COPD with acute exacerbation (HCC)  Admit to telemetry/inpatient. Continue supplemental oxygen . Scheduled and as needed bronchodilators. Continue ceftriaxone  1 g IVPB daily. Continue doxycycline  100 mg p.o. twice daily. Check strep pneumoniae urinary antigen. Check sputum Gram stain, culture and sensitivity. Follow-up blood culture and sensitivity. Follow-up CBC and chemistry in the morning. - Change p.o. prednisone  to IV Solu-Medrol  for 24 hours.   Active Problems:   Essential hypertension Continue Nebivolol  20 mg p.o. daily. Continue amlodipine  5 mg p.o. daily. Continue hydrochlorothiazide  25 mg p.o. daily.     Borderline prolonged QT interval Avoid QT prolonging meds as possible. KCl 20 mill equivalents p.o. x 1 dose. Magnesium  sulfate 2 g IVPB now. Keep electrolytes optimized. Check EKG in the morning.     Chronic diastolic congestive heart failure (HCC) Does not seem to be volume overloaded. Continue Nebivolol  and HCTZ.     Lumbar radiculopathy Analgesics as needed. Cyclobenzaprine  5 mg p.o. twice daily as needed.     CKD stage 3a, GFR 45-59 ml/min (HCC) Monitor renal function and electrolytes.     GERD (gastroesophageal reflux disease) Continue pantoprazole  40 mg p.o. daily.      DVT prophylaxis: enoxaparin  (LOVENOX ) injection 40 mg Start: 09/15/24 2200     Code Status: Full Code Family Communication: No family at the bedside Disposition Plan: Home Reason for continuing need for hospitalization: Significant bronchospasm  persists, need to follow-up cultures  Objective: Vitals:   09/16/24 0856 09/16/24 1233 09/16/24 1434 09/16/24 1544  BP: (!) 134/47  (!) 149/65   Pulse: 77  80   Resp:      Temp:   98.4 F (36.9 C)   TempSrc:   Oral   SpO2:  97% 99% 96%  Weight:      Height:        Intake/Output Summary (Last 24 hours) at 09/16/2024 1738 Last data filed at 09/16/2024 1500 Gross per 24 hour  Intake 720 ml  Output --  Net 720 ml   Filed Weights   09/15/24 1658  Weight: 66.3 kg    Examination:  Nontoxic but fatigued and ill looking HEENT: PERRLA  EOMI no scleral icterus oropharynx moist and pink no JVD CV: Regular rate and rhythm S1-S2 no murmurs rubs gallop Lungs: Diffuse rhonchi, rare high-pitched wheeze no crackles Abdomen: Soft, nontender, nondistended bowel sounds Extremities:  no edema Neurologic: Nonfocal  Data Reviewed: I have personally reviewed following labs and imaging studies  CBC: Recent Labs  Lab 09/15/24 1306 09/16/24 0746  WBC 7.6 7.2  NEUTROABS 6.1  --   HGB 13.5 12.6  HCT 41.9 37.1  MCV 95.7 91.8  PLT 171 163   Basic Metabolic Panel: Recent Labs  Lab 09/15/24 1306 09/16/24 0746  NA 143 139  K 3.7 3.6  CL 104 103  CO2 27 23  GLUCOSE 122* 109*  BUN 30* 22  CREATININE 1.03* 0.92  CALCIUM  9.9 9.4   GFR: Estimated Creatinine Clearance: 42.1 mL/min (by C-G formula based on SCr of 0.92 mg/dL). Liver Function Tests: Recent Labs  Lab  09/15/24 1306 09/16/24 0746  AST 31 26  ALT 19 16  ALKPHOS 61 55  BILITOT 0.5 0.5  PROT 7.7 7.1  ALBUMIN 4.5 4.0   No results for input(s): LIPASE, AMYLASE in the last 168 hours. No results for input(s): AMMONIA in the last 168 hours. Coagulation Profile: No results for input(s): INR, PROTIME in the last 168 hours. Cardiac Enzymes: No results for input(s): CKTOTAL, CKMB, CKMBINDEX, TROPONINI in the last 168 hours. ProBNP, BNP (last 5 results) No results for input(s): PROBNP, BNP in the last  8760 hours. HbA1C: No results for input(s): HGBA1C in the last 72 hours. CBG: No results for input(s): GLUCAP in the last 168 hours. Lipid Profile: No results for input(s): CHOL, HDL, LDLCALC, TRIG, CHOLHDL, LDLDIRECT in the last 72 hours. Thyroid  Function Tests: No results for input(s): TSH, T4TOTAL, FREET4, T3FREE, THYROIDAB in the last 72 hours. Anemia Panel: No results for input(s): VITAMINB12, FOLATE, FERRITIN, TIBC, IRON, RETICCTPCT in the last 72 hours. Sepsis Labs: Recent Labs  Lab 09/16/24 0746  PROCALCITON <0.10    Recent Results (from the past 240 hours)  Resp panel by RT-PCR (RSV, Flu A&B, Covid) Anterior Nasal Swab     Status: None   Collection Time: 09/15/24  1:50 PM   Specimen: Anterior Nasal Swab  Result Value Ref Range Status   SARS Coronavirus 2 by RT PCR NEGATIVE NEGATIVE Final    Comment: (NOTE) SARS-CoV-2 target nucleic acids are NOT DETECTED.  The SARS-CoV-2 RNA is generally detectable in upper respiratory specimens during the acute phase of infection. The lowest concentration of SARS-CoV-2 viral copies this assay can detect is 138 copies/mL. A negative result does not preclude SARS-Cov-2 infection and should not be used as the sole basis for treatment or other patient management decisions. A negative result may occur with  improper specimen collection/handling, submission of specimen other than nasopharyngeal swab, presence of viral mutation(s) within the areas targeted by this assay, and inadequate number of viral copies(<138 copies/mL). A negative result must be combined with clinical observations, patient history, and epidemiological information. The expected result is Negative.  Fact Sheet for Patients:  bloggercourse.com  Fact Sheet for Healthcare Providers:  seriousbroker.it  This test is no t yet approved or cleared by the [place]  FDA and  has been  authorized for detection and/or diagnosis of SARS-CoV-2 by FDA under an Emergency Use Authorization (EUA). This EUA will remain  in effect (meaning this test can be used) for the duration of the COVID-19 declaration under Section 564(b)(1) of the Act, 21 U.S.C.section 360bbb-3(b)(1), unless the authorization is terminated  or revoked sooner.       Influenza A by PCR NEGATIVE NEGATIVE Final   Influenza B by PCR NEGATIVE NEGATIVE Final    Comment: (NOTE) The Xpert Xpress SARS-CoV-2/FLU/RSV plus assay is intended as an aid in the diagnosis of influenza from Nasopharyngeal swab specimens and should not be used as a sole basis for treatment. Nasal washings and aspirates are unacceptable for Xpert Xpress SARS-CoV-2/FLU/RSV testing.  Fact Sheet for Patients: bloggercourse.com  Fact Sheet for Healthcare Providers: seriousbroker.it  This test is not yet approved or cleared by the [place]  FDA and has been authorized for detection and/or diagnosis of SARS-CoV-2 by FDA under an Emergency Use Authorization (EUA). This EUA will remain in effect (meaning this test can be used) for the duration of the COVID-19 declaration under Section 564(b)(1) of the Act, 21 U.S.C. section 360bbb-3(b)(1), unless the authorization is terminated or revoked.  Resp Syncytial Virus by PCR NEGATIVE NEGATIVE Final    Comment: (NOTE) Fact Sheet for Patients: bloggercourse.com  Fact Sheet for Healthcare Providers: seriousbroker.it  This test is not yet approved or cleared by the [place]  FDA and has been authorized for detection and/or diagnosis of SARS-CoV-2 by FDA under an Emergency Use Authorization (EUA). This EUA will remain in effect (meaning this test can be used) for the duration of the COVID-19 declaration under Section 564(b)(1) of the Act, 21 U.S.C. section 360bbb-3(b)(1), unless the  authorization is terminated or revoked.  Performed at [facility], [address], [place], [place] [address]   Culture, blood (routine x 2)     Status: None (Preliminary result)   Collection Time: 09/15/24  2:30 PM   Specimen: BLOOD  Result Value Ref Range Status   Specimen Description   Final    BLOOD LEFT ANTECUBITAL Performed at [facility], [address], [place], [place] [address]    Special Requests   Final    BOTTLES DRAWN AEROBIC AND ANAEROBIC Blood Culture adequate volume Performed at [facility], [address] [place], [place] [address]    Culture   Final    NO GROWTH < 24 HOURS Performed at [facility], [address] [place], [place] [address]    Report Status PENDING  Incomplete  Culture, blood (routine x 2)     Status: None (Preliminary result)   Collection Time: 09/15/24  5:28 PM   Specimen: BLOOD LEFT HAND  Result Value Ref Range Status   Specimen Description   Final    BLOOD LEFT HAND Performed at [facility], [address] [place], [place] [address]    Special Requests   Final    BOTTLES DRAWN AEROBIC AND ANAEROBIC Blood Culture adequate volume Performed at [facility], [address] [place], [place] [address]    Culture   Final    NO GROWTH < 12 HOURS Performed at [facility], [address] [place], [place] [address]    Report Status PENDING  Incomplete     Radiology Studies: DG Chest 2 View Result Date: 09/15/2024 CLINICAL DATA:  Cough and hypoxia. EXAM: CHEST - 2 VIEW COMPARISON:  02/19/2023 and CT chest 09/27/2022. FINDINGS: Trachea is midline. Heart size normal. Interstitial prominence and indistinctness with bibasilar airspace opacities. No definite pleural fluid. IMPRESSION: Interstitial prominence and indistinctness with bibasilar airspace opacities, findings indicative of an infectious bronchiolitis/bronchopneumonia. Electronically  Signed   By: Newell Eke M.D.   On: 09/15/2024 13:53    Scheduled Meds:  amLODipine   5 mg Oral Daily   cholecalciferol   5,000 Units Oral Daily   citalopram   40 mg Oral Daily   cyclobenzaprine   5 mg Oral Once   doxycycline   100 mg Oral Q12H   enoxaparin  (LOVENOX ) injection  40 mg Subcutaneous Q24H   gabapentin   300 mg Oral QHS   hydrochlorothiazide   25 mg Oral Daily   ipratropium-albuterol   3 mL Nebulization QID   loratadine   10 mg Oral Daily   melatonin  3 mg Oral QHS   methylPREDNISolone  (SOLU-MEDROL ) injection  40 mg Intravenous Q12H   nebivolol   20 mg Oral Daily   pantoprazole   40 mg Oral QAC breakfast   potassium chloride   10 mEq Oral Daily   rosuvastatin   20 mg Oral Daily   Continuous Infusions:  cefTRIAXone  (ROCEPHIN )  IV 1 g (09/16/24 1434)  LOS: 1 day   Time spent: 35 minutes  Lonni KANDICE Moose, MD  [facility]  09/16/2024, 5:38 PM   "

## 2024-09-16 NOTE — Progress Notes (Signed)
" °   09/16/24 0825  TOC Brief Assessment  Insurance and Status Reviewed  Patient has primary care physician Yes Barnett, Vernell, MD)  Home environment has been reviewed from home  Prior level of function: Independent  Prior/Current Home Services No current home services  Social Drivers of Health Review SDOH reviewed no interventions necessary  Readmission risk has been reviewed Yes  Transition of care needs no transition of care needs at this time    "

## 2024-09-16 NOTE — Discharge Instructions (Signed)
 FOOD PANTRY [facility] [address] [place], [place] [address] [phone]  [employer] [facility] [address] [place], [place] [address] [phone]  [facility] [address] [place], [place] [address] [phone]  [facility] [address] [place], [place] [address] [phone]  [facility] - Food Distribution Center [address] [place], [place] [address] [phone]  UTILITIES [facility] [address] [place], [place] [address]  [phone] Rental assistance/rental hotline: [phone] ext. 340 Utility assistance/utility hotline: 6104815685 ext. [address] [employer] Consultant (heating/cooling and water assistance) [phone] (rental and utility assistance) [phone]  [employer] - call 211  TRANSPORTATION [facility] [address] [place], [place] [address] [phone]  [employer] by [employer] [place] I-Ride Reservations Line: [phone]     Passengers can simply call the [employer] reservations number at [phone] for pickup. However, with [employer] same-day service is available with at least two hour notice Monday through Friday. You will need your [employer] [place] client ID# when you call for reservations. If you do not know it, you can call [phone] to request it. [employer] offers a flat fare of $8.50 per trip. This will cover travel anywhere within the city limits of [place].

## 2024-09-16 NOTE — Progress Notes (Signed)
 Mobility Specialist - Progress Note:   09/16/24 1510  Mobility  Activity Ambulated with assistance  Level of Assistance Contact guard assist, steadying assist  Assistive Device Front wheel walker  Distance Ambulated (ft) 20 ft  Activity Response RN notified  Mobility Referral Yes  Mobility visit 1 Mobility  Mobility Specialist Start Time (ACUTE ONLY) 1415  Mobility Specialist Stop Time (ACUTE ONLY) 1424  Mobility Specialist Time Calculation (min) (ACUTE ONLY) 9 min   Pt was received in bed and agreed to O2 walk test:  Nurse requested Mobility Specialist to perform oxygen  saturation test with pt which includes removing pt from oxygen  both at rest and while ambulating.  Below are the results from that testing.     Patient Saturations on Room Air at Rest = spO2 90%  Patient Saturations on Room Air while Ambulating = sp02 86% .  Rested and performed pursed lip breathing for 1 minute with sp02 at 87%.  Pt stated feeling uneasy from a headache, requested to sit in chair. Rolled Pt back to room and returned to bed.   At end of testing pt left in room on 2 Liters of oxygen .  Reported results to nurse.    [employer] - Mobility Specialist - Acute Rehabilitation Can be reached via [employer]

## 2024-09-16 NOTE — Plan of Care (Signed)
" °  Problem: Activity: Goal: Risk for activity intolerance will decrease Outcome: Progressing   Problem: Coping: Goal: Level of anxiety will decrease Outcome: Progressing   Problem: Pain Managment: Goal: General experience of comfort will improve and/or be controlled Outcome: Progressing   Problem: Activity: Goal: Ability to tolerate increased activity will improve Outcome: Progressing Goal: Will verbalize the importance of balancing activity with adequate rest periods Outcome: Progressing   Problem: Respiratory: Goal: Ability to maintain adequate ventilation will improve Outcome: Progressing   Problem: Activity: Goal: Ability to tolerate increased activity will improve Outcome: Progressing   "

## 2024-09-17 LAB — CULTURE, BLOOD (ROUTINE X 2)
Culture: NO GROWTH
Culture: NO GROWTH
Special Requests: ADEQUATE
Special Requests: ADEQUATE

## 2024-09-17 LAB — EXPECTORATED SPUTUM ASSESSMENT W GRAM STAIN, RFLX TO RESP C

## 2024-09-17 MED ORDER — METHYLPREDNISOLONE SODIUM SUCC 40 MG IJ SOLR
40.0000 mg | Freq: Four times a day (QID) | INTRAMUSCULAR | Status: AC
  Administered 2024-09-17 (×2): 40 mg via INTRAVENOUS
  Filled 2024-09-17 (×2): qty 1

## 2024-09-17 MED ORDER — PHENOL 1.4 % MT LIQD: 1.0000 | OROMUCOSAL | Status: AC | PRN

## 2024-09-17 MED ORDER — DM-GUAIFENESIN ER 30-600 MG PO TB12
1.0000 | ORAL_TABLET | Freq: Two times a day (BID) | ORAL | Status: AC
  Administered 2024-09-17 (×2): 1 via ORAL
  Filled 2024-09-17 (×2): qty 1

## 2024-09-17 MED ORDER — GUAIFENESIN-DM 100-10 MG/5ML PO SYRP
5.0000 mL | ORAL_SOLUTION | ORAL | Status: AC | PRN
  Administered 2024-09-17: 5 mL via ORAL
  Filled 2024-09-17: qty 5

## 2024-09-17 NOTE — Progress Notes (Signed)
 This nurse sent nurse technician Damia, NT inside of the patients room to discontinue the Telemetry box due to A. Andrez, NP not renewing the orders and the order expiring around 15 hours ago. The patient then demanded to speak to me the her RN which would be me Zaine Elsass, RN and state that she was very upset about being taken off of the Telemetry. Upon entering the room the patient began stating that she felt in the dark and was being treated as a child because this pertains to my health and no disrespect, but no one is taking this seriously. I explained to the patient the process of the order not being renewed because the provider deemed her stable to come off of the telemetry. The patient then stated I have just gotten off of the phone with the emergency doctor on call and when they call me back I want you to be in here to hear what they have to say. Is insurance not covering this anymore or what is it? I informed the patient that I did not know how the insurance process works, but that it did not have anything to do with her insurance. But her medical stability and the provider no longer renewing the order because she was stable enough without the tele.   Provider  A. Andrez, NP notified and states that the patient can follow up with dayshift team regarding her frustrations and concerns.

## 2024-09-17 NOTE — Progress Notes (Signed)
 Mobility Specialist - Progress Note:  90-95 bpm HR  09/17/24 1354  Oxygen  Therapy  SpO2 93 %  O2 Device Nasal Cannula  O2 Flow Rate (L/min) 2 L/min  Patient Activity (if Appropriate) In bed  Mobility  Activity  (Bed Exercsies)  Range of Motion/Exercises Active  Activity Response Tolerated well  Mobility Referral Yes  Mobility visit 1 Mobility  Mobility Specialist Start Time (ACUTE ONLY) 1052  Mobility Specialist Stop Time (ACUTE ONLY) 1111  Mobility Specialist Time Calculation (min) (ACUTE ONLY) 19 min   Pt was received in bed and agreed to mobility exercises: Seated BLE Exercises: 10 reps each  1) Ankle Pumps  2) Knee Extension (hold 3 seconds)  3) Marching    4) Hip Adduction (pillow squeezes)   Pt stated slight shortness of breath, informed on proper breathing techniques. Returned to bed with all needs met. Call bell in reach.  [employer] - Mobility Specialist - Acute Rehabilitation Can be reached via [employer]

## 2024-09-17 NOTE — Plan of Care (Signed)
" °  Problem: Activity: Goal: Risk for activity intolerance will decrease Outcome: Progressing   Problem: Nutrition: Goal: Adequate nutrition will be maintained Outcome: Progressing   Problem: Coping: Goal: Level of anxiety will decrease Outcome: Progressing   Problem: Pain Managment: Goal: General experience of comfort will improve and/or be controlled Outcome: Progressing   Problem: Respiratory: Goal: Ability to maintain adequate ventilation will improve Outcome: Progressing   Problem: Activity: Goal: Ability to tolerate increased activity will improve Outcome: Progressing   "

## 2024-09-17 NOTE — Progress Notes (Signed)
 " PROGRESS NOTE    Renee Adkins  FMW:[id number] DOB: 10/13/1945 DOA: 09/15/2024 PCP: Rolinda Millman, MD  Subjective:  Patient states she still does not feel too good this afternoon.  Coughing up thick yellow sputum.  Advised RN to send sample to the lab.  Still having significant cough and bronchospasm.  No chest pain.  Hospital Course: No notes on file   Assessment and Plan:   Principal Problem:   Acute respiratory failure with hypoxia (HCC) In the setting of:   Bronchopneumonia Complicated by:   COPD with acute exacerbation (HCC)  Admit to telemetry/inpatient. Continue supplemental oxygen . Scheduled and as needed bronchodilators. Continue ceftriaxone  1 g IVPB daily. Continue doxycycline  100 mg p.o. twice daily. Check strep pneumoniae urinary antigen. Check sputum Gram stain, culture and sensitivity. Follow-up blood culture and sensitivity. Follow-up CBC and chemistry in the morning. - Change p.o. prednisone  to IV Solu-Medrol  for 24 hours. -Increase IV Solu-Medrol  from 40 mg IV every 12 to 40 mg IV every 6, add Mucinex , add flutter valve, send sputum as ordered   Active Problems:   Essential hypertension Continue Nebivolol  20 mg p.o. daily. Continue amlodipine  5 mg p.o. daily. Continue hydrochlorothiazide  25 mg p.o. daily.     Borderline prolonged QT interval Avoid QT prolonging meds as possible. KCl 20 mill equivalents p.o. x 1 dose. Magnesium  sulfate 2 g IVPB now. Keep electrolytes optimized. Check EKG in the morning.     Chronic diastolic congestive heart failure (HCC) Does not seem to be volume overloaded. Continue Nebivolol  and HCTZ.     Lumbar radiculopathy Analgesics as needed. Cyclobenzaprine  5 mg p.o. twice daily as needed.     CKD stage 3a, GFR 45-59 ml/min (HCC) Monitor renal function and electrolytes.     GERD (gastroesophageal reflux disease) Continue pantoprazole  40 mg p.o. daily.      DVT prophylaxis: enoxaparin  (LOVENOX ) injection 40 mg  Start: 09/15/24 2200     Code Status: Full Code Family Communication: No family at the bedside Disposition Plan: Home Reason for continuing need for hospitalization: Significant bronchospasm persists, need to follow-up cultures  Objective: Vitals:   09/16/24 1544 09/16/24 2120 09/16/24 2140 09/17/24 0519  BP:  (!) 152/73  (!) 157/70  Pulse:  81  82  Resp:  20  20  Temp:  97.9 F (36.6 C)  98 F (36.7 C)  TempSrc:  Oral  Oral  SpO2: 96% 95% 94% 96%  Weight:      Height:        Intake/Output Summary (Last 24 hours) at 09/17/2024 9277 Last data filed at 09/16/2024 1900 Gross per 24 hour  Intake 720 ml  Output --  Net 720 ml   Filed Weights   09/15/24 1658  Weight: 66.3 kg    Examination:  Nontoxic but fatigued and ill looking HEENT: PERRLA  EOMI no scleral icterus oropharynx moist and pink no JVD CV: Regular rate and rhythm S1-S2 no murmurs rubs gallop Lungs: Slightly improved air entry, mild increased work of breathing, no crackles scattered rhonchi Abdomen: Soft, nontender, nondistended bowel sounds Extremities:  no edema Neurologic: Nonfocal  Data Reviewed: I have personally reviewed following labs and imaging studies  CBC: Recent Labs  Lab 09/15/24 1306 09/16/24 0746  WBC 7.6 7.2  NEUTROABS 6.1  --   HGB 13.5 12.6  HCT 41.9 37.1  MCV 95.7 91.8  PLT 171 163   Basic Metabolic Panel: Recent Labs  Lab 09/15/24 1306 09/16/24 0746  NA 143 139  K 3.7  3.6  CL 104 103  CO2 27 23  GLUCOSE 122* 109*  BUN 30* 22  CREATININE 1.03* 0.92  CALCIUM  9.9 9.4   GFR: Estimated Creatinine Clearance: 42.1 mL/min (by C-G formula based on SCr of 0.92 mg/dL). Liver Function Tests: Recent Labs  Lab 09/15/24 1306 09/16/24 0746  AST 31 26  ALT 19 16  ALKPHOS 61 55  BILITOT 0.5 0.5  PROT 7.7 7.1  ALBUMIN 4.5 4.0   No results for input(s): LIPASE, AMYLASE in the last 168 hours. No results for input(s): AMMONIA in the last 168 hours. Coagulation  Profile: No results for input(s): INR, PROTIME in the last 168 hours. Cardiac Enzymes: No results for input(s): CKTOTAL, CKMB, CKMBINDEX, TROPONINI in the last 168 hours. ProBNP, BNP (last 5 results) No results for input(s): PROBNP, BNP in the last 8760 hours. HbA1C: No results for input(s): HGBA1C in the last 72 hours. CBG: No results for input(s): GLUCAP in the last 168 hours. Lipid Profile: No results for input(s): CHOL, HDL, LDLCALC, TRIG, CHOLHDL, LDLDIRECT in the last 72 hours. Thyroid  Function Tests: No results for input(s): TSH, T4TOTAL, FREET4, T3FREE, THYROIDAB in the last 72 hours. Anemia Panel: No results for input(s): VITAMINB12, FOLATE, FERRITIN, TIBC, IRON, RETICCTPCT in the last 72 hours. Sepsis Labs: Recent Labs  Lab 09/16/24 0746  PROCALCITON <0.10    Recent Results (from the past 240 hours)  Resp panel by RT-PCR (RSV, Flu A&B, Covid) Anterior Nasal Swab     Status: None   Collection Time: 09/15/24  1:50 PM   Specimen: Anterior Nasal Swab  Result Value Ref Range Status   SARS Coronavirus 2 by RT PCR NEGATIVE NEGATIVE Final    Comment: (NOTE) SARS-CoV-2 target nucleic acids are NOT DETECTED.  The SARS-CoV-2 RNA is generally detectable in upper respiratory specimens during the acute phase of infection. The lowest concentration of SARS-CoV-2 viral copies this assay can detect is 138 copies/mL. A negative result does not preclude SARS-Cov-2 infection and should not be used as the sole basis for treatment or other patient management decisions. A negative result may occur with  improper specimen collection/handling, submission of specimen other than nasopharyngeal swab, presence of viral mutation(s) within the areas targeted by this assay, and inadequate number of viral copies(<138 copies/mL). A negative result must be combined with clinical observations, patient history, and epidemiological information. The  expected result is Negative.  Fact Sheet for Patients:  bloggercourse.com  Fact Sheet for Healthcare Providers:  seriousbroker.it  This test is no t yet approved or cleared by the [place]  FDA and  has been authorized for detection and/or diagnosis of SARS-CoV-2 by FDA under an Emergency Use Authorization (EUA). This EUA will remain  in effect (meaning this test can be used) for the duration of the COVID-19 declaration under Section 564(b)(1) of the Act, 21 U.S.C.section 360bbb-3(b)(1), unless the authorization is terminated  or revoked sooner.       Influenza A by PCR NEGATIVE NEGATIVE Final   Influenza B by PCR NEGATIVE NEGATIVE Final    Comment: (NOTE) The Xpert Xpress SARS-CoV-2/FLU/RSV plus assay is intended as an aid in the diagnosis of influenza from Nasopharyngeal swab specimens and should not be used as a sole basis for treatment. Nasal washings and aspirates are unacceptable for Xpert Xpress SARS-CoV-2/FLU/RSV testing.  Fact Sheet for Patients: bloggercourse.com  Fact Sheet for Healthcare Providers: seriousbroker.it  This test is not yet approved or cleared by the [place]  FDA and has been authorized for detection and/or  diagnosis of SARS-CoV-2 by FDA under an Emergency Use Authorization (EUA). This EUA will remain in effect (meaning this test can be used) for the duration of the COVID-19 declaration under Section 564(b)(1) of the Act, 21 U.S.C. section 360bbb-3(b)(1), unless the authorization is terminated or revoked.     Resp Syncytial Virus by PCR NEGATIVE NEGATIVE Final    Comment: (NOTE) Fact Sheet for Patients: bloggercourse.com  Fact Sheet for Healthcare Providers: seriousbroker.it  This test is not yet approved or cleared by the [place]  FDA and has been authorized for detection and/or  diagnosis of SARS-CoV-2 by FDA under an Emergency Use Authorization (EUA). This EUA will remain in effect (meaning this test can be used) for the duration of the COVID-19 declaration under Section 564(b)(1) of the Act, 21 U.S.C. section 360bbb-3(b)(1), unless the authorization is terminated or revoked.  Performed at [facility], [address], [place], [place] [address]   Culture, blood (routine x 2)     Status: None (Preliminary result)   Collection Time: 09/15/24  2:30 PM   Specimen: BLOOD  Result Value Ref Range Status   Specimen Description   Final    BLOOD LEFT ANTECUBITAL Performed at [facility], [address] [place], [place] [address]    Special Requests   Final    BOTTLES DRAWN AEROBIC AND ANAEROBIC Blood Culture adequate volume Performed at [facility], [address], [place], [place] [address]    Culture   Final    NO GROWTH < 24 HOURS Performed at [facility], [address] [place], [place] [address]    Report Status PENDING  Incomplete  Culture, blood (routine x 2)     Status: None (Preliminary result)   Collection Time: 09/15/24  5:28 PM   Specimen: BLOOD LEFT HAND  Result Value Ref Range Status   Specimen Description   Final    BLOOD LEFT HAND Performed at [facility], [address] [place], [place] [address]    Special Requests   Final    BOTTLES DRAWN AEROBIC AND ANAEROBIC Blood Culture adequate volume Performed at [facility], [address], [place], [place] [address]    Culture   Final    NO GROWTH < 12 HOURS Performed at [facility], [address], [place], [place] [address]    Report Status PENDING  Incomplete     Radiology Studies: DG Chest 2 View Result Date: 09/15/2024 CLINICAL DATA:  Cough and hypoxia. EXAM: CHEST - 2 VIEW COMPARISON:  02/19/2023 and CT chest 09/27/2022. FINDINGS: Trachea is midline. Heart size normal. Interstitial  prominence and indistinctness with bibasilar airspace opacities. No definite pleural fluid. IMPRESSION: Interstitial prominence and indistinctness with bibasilar airspace opacities, findings indicative of an infectious bronchiolitis/bronchopneumonia. Electronically Signed   By: Newell Eke M.D.   On: 09/15/2024 13:53    Scheduled Meds:  amLODipine   5 mg Oral Daily   cholecalciferol   5,000 Units Oral Daily   citalopram   40 mg Oral Daily   cyclobenzaprine   5 mg Oral Once   doxycycline   100 mg Oral Q12H   enoxaparin  (LOVENOX ) injection  40 mg Subcutaneous Q24H   gabapentin   300 mg Oral QHS   hydrochlorothiazide   25 mg Oral Daily   ipratropium-albuterol   3 mL Nebulization QID   loratadine   10 mg Oral Daily   melatonin  3 mg Oral QHS   methylPREDNISolone  (SOLU-MEDROL ) injection  40 mg Intravenous Q12H  nebivolol   20 mg Oral Daily   pantoprazole   40 mg Oral QAC breakfast   potassium chloride   10 mEq Oral Daily   rosuvastatin   20 mg Oral Daily   Continuous Infusions:  cefTRIAXone  (ROCEPHIN )  IV 1 g (09/16/24 1434)     LOS: 2 days   Time spent: 40 minutes  Lonni KANDICE Moose, MD  [facility]  09/17/2024, 7:22 AM   "

## 2024-09-17 NOTE — Progress Notes (Signed)
 Pt upset because tele order expired. Requesting for tele to be reordered. Provider notified and see orders.

## 2024-11-30 ENCOUNTER — Ambulatory Visit: Admitting: Pulmonary Disease
